# Patient Record
Sex: Female | Born: 1965 | Race: Black or African American | Hispanic: No | Marital: Married | Smoking: Never smoker
Health system: Southern US, Academic
[De-identification: ages and names within clinical notes are randomized; demographics above are authoritative.]

## PROBLEM LIST (undated history)

## (undated) DIAGNOSIS — I509 Heart failure, unspecified: Secondary | ICD-10-CM

## (undated) DIAGNOSIS — F32A Depression, unspecified: Secondary | ICD-10-CM

## (undated) DIAGNOSIS — G5600 Carpal tunnel syndrome, unspecified upper limb: Secondary | ICD-10-CM

## (undated) DIAGNOSIS — R519 Headache, unspecified: Secondary | ICD-10-CM

## (undated) DIAGNOSIS — J45909 Unspecified asthma, uncomplicated: Secondary | ICD-10-CM

## (undated) DIAGNOSIS — I10 Essential (primary) hypertension: Secondary | ICD-10-CM

## (undated) DIAGNOSIS — E079 Disorder of thyroid, unspecified: Secondary | ICD-10-CM

## (undated) DIAGNOSIS — G459 Transient cerebral ischemic attack, unspecified: Secondary | ICD-10-CM

## (undated) DIAGNOSIS — E119 Type 2 diabetes mellitus without complications: Secondary | ICD-10-CM

## (undated) DIAGNOSIS — G629 Polyneuropathy, unspecified: Secondary | ICD-10-CM

## (undated) DIAGNOSIS — M797 Fibromyalgia: Secondary | ICD-10-CM

## (undated) DIAGNOSIS — M129 Arthropathy, unspecified: Secondary | ICD-10-CM

## (undated) DIAGNOSIS — G2581 Restless legs syndrome: Secondary | ICD-10-CM

## (undated) DIAGNOSIS — M5126 Other intervertebral disc displacement, lumbar region: Secondary | ICD-10-CM

## (undated) DIAGNOSIS — K3184 Gastroparesis: Secondary | ICD-10-CM

## (undated) DIAGNOSIS — J449 Chronic obstructive pulmonary disease, unspecified: Secondary | ICD-10-CM

## (undated) DIAGNOSIS — E109 Type 1 diabetes mellitus without complications: Secondary | ICD-10-CM

## (undated) HISTORY — PX: HX HERNIA REPAIR: SHX51

## (undated) HISTORY — PX: HX ROTATOR CUFF REPAIR: SHX139

## (undated) HISTORY — DX: Depression, unspecified: F32.A

## (undated) HISTORY — PX: STOMACH SURGERY: SHX791

## (undated) HISTORY — PX: HX GALL BLADDER SURGERY/CHOLE: SHX55

## (undated) HISTORY — PX: HX HYSTERECTOMY: SHX81

## (undated) HISTORY — PX: SHOULDER OPEN ROTATOR CUFF REPAIR: SHX2407

---

## 1989-10-29 ENCOUNTER — Other Ambulatory Visit (HOSPITAL_COMMUNITY): Payer: Self-pay

## 2006-09-11 ENCOUNTER — Ambulatory Visit (INDEPENDENT_AMBULATORY_CARE_PROVIDER_SITE_OTHER): Payer: Self-pay | Admitting: Neurological Surgery

## 2013-08-19 IMAGING — MG MAMMO DIGITAL DIAGNOSTIC BOTH BREASTS
1 series · 8 of 8 positions shown · non-contrast
Comparison: 

------------- REPORT [id number] -------------
[facility]
[address]
Mor Ms.DECELIS, CIAXAN:
We wish to report the following on your recent mammography examination. We are sending a report to your referring physician or other health care provider. 
(       Normal/Negative:
No evidence of cancer.
This statement is mandated by the [place], Department of Health.
Your examination was performed by one of our technologists, who are registered radiological technologists and also specially certified in mammography:
___
Donjuan, Wavy (M)
Tena, Danielf (M)
Gia, Nelith (M)

Your mammogram was interpreted by our radiologist.
( 
Mmamontsho Seakolo, M.D.
(Annual Breast Examination by a physician or other health care provider
(Annual Mammography Screening beginning at age 40
(Monthly Breast Self Examination
------------- REPORT [id number] -------------
UCHIHA, LAURENE
MAMMO DIGITAL DIAGNOSTIC BOTH BREASTS WITH CAD,US BREAST LT
Exam:  
Diagnostic digital bilateral breast mammogram with CAD and left breast ultrasound
HISTORY: Breast pain.

[R CC · oblique · right · 8 of 9 slices shown]
[im 1/9]
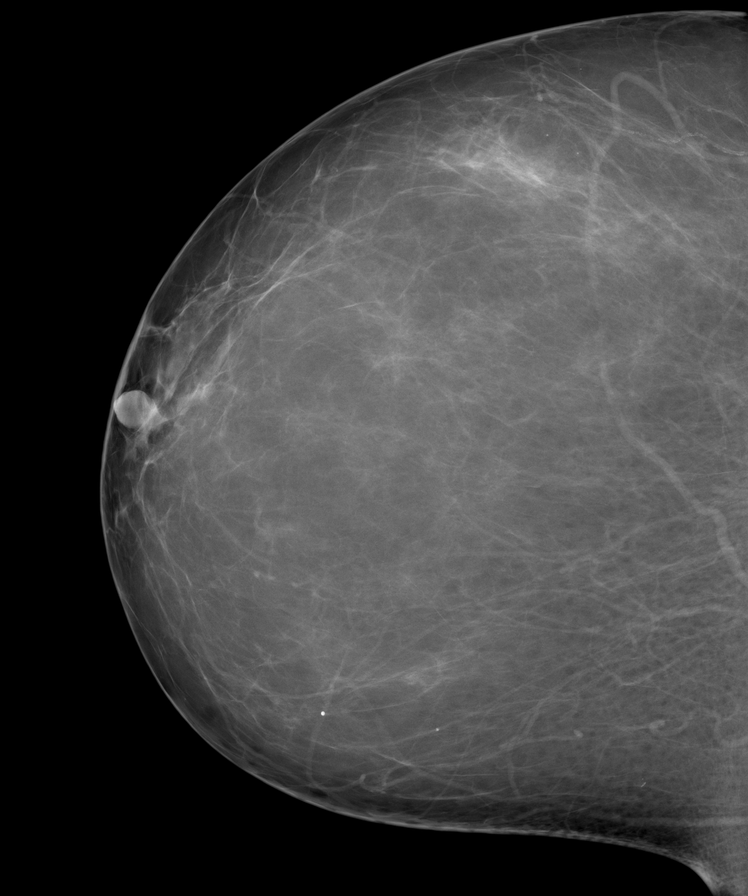
[im 2/9]
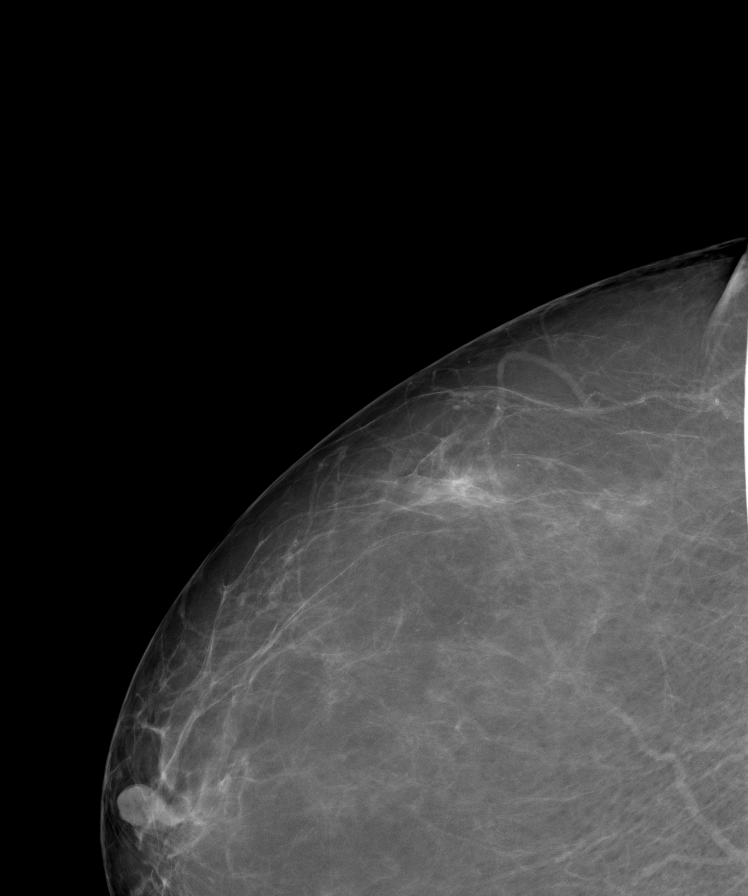
[im 3/9]
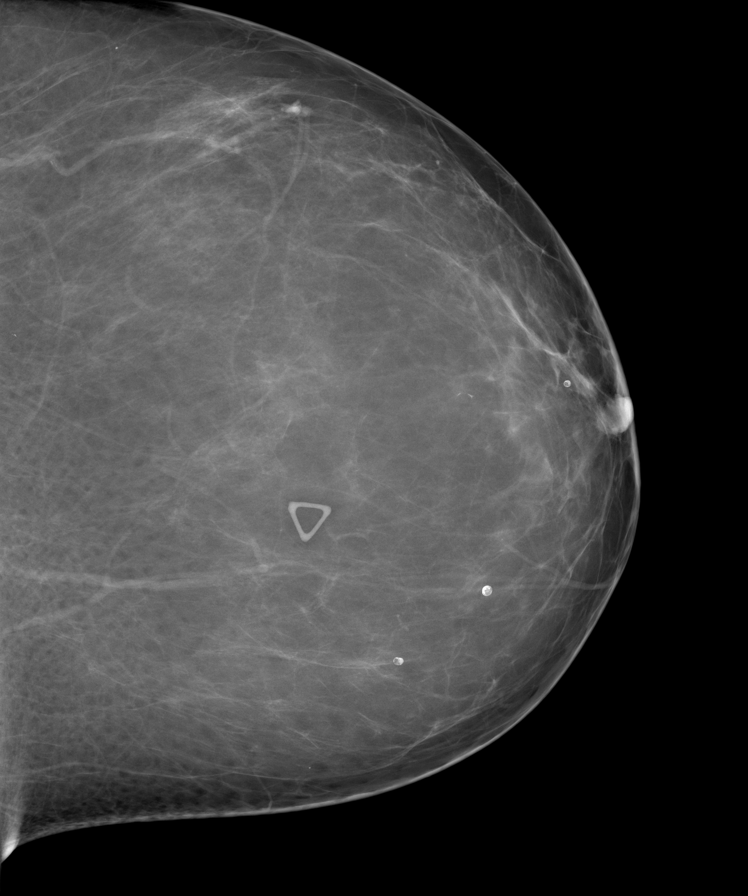
[im 4/9]
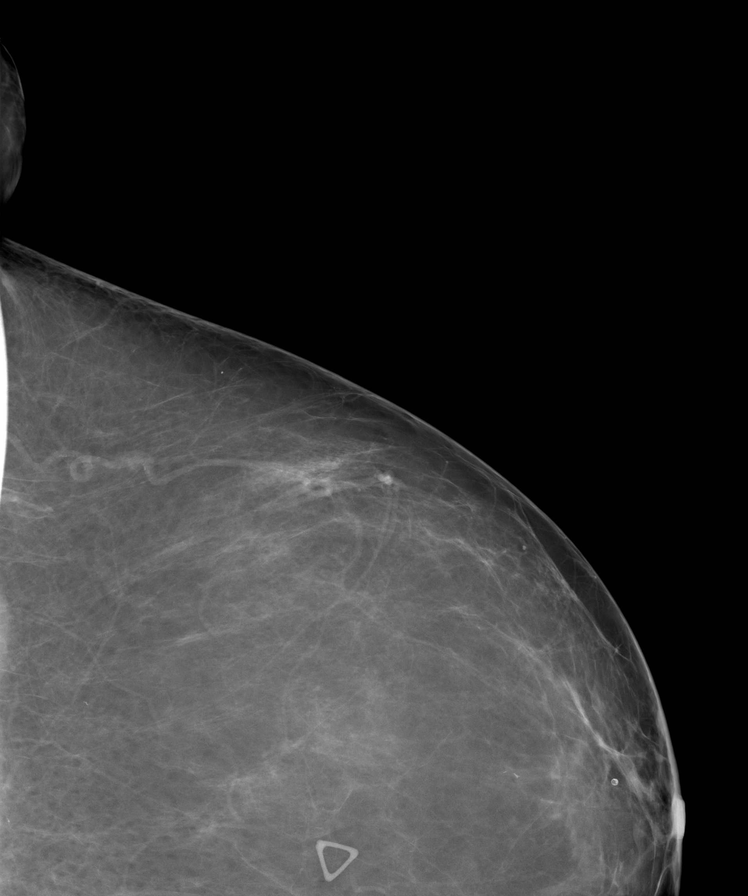
[im 5/9]
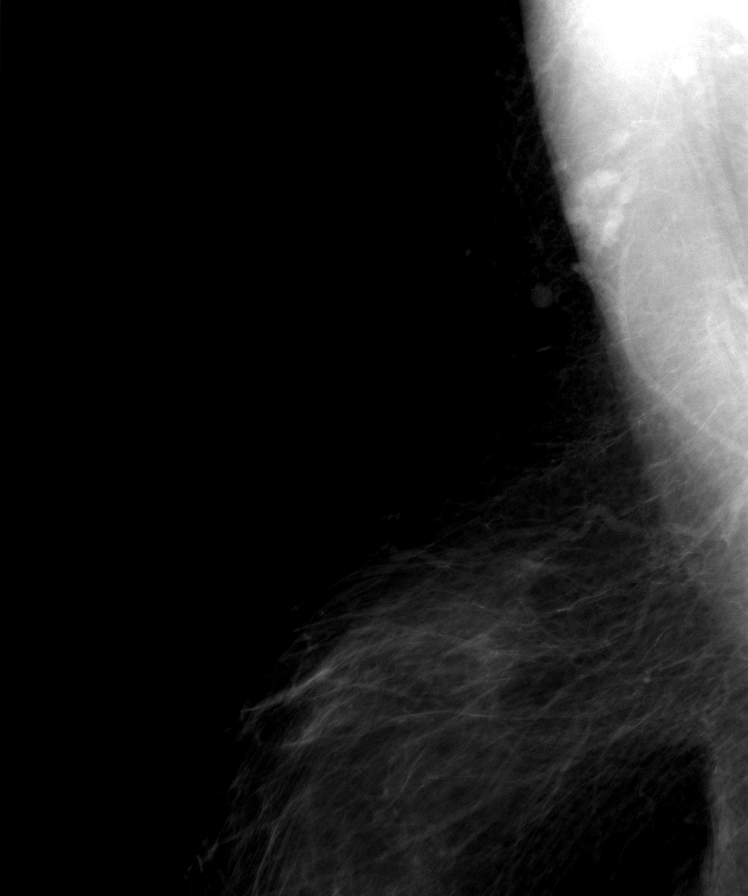
[im 6/9]
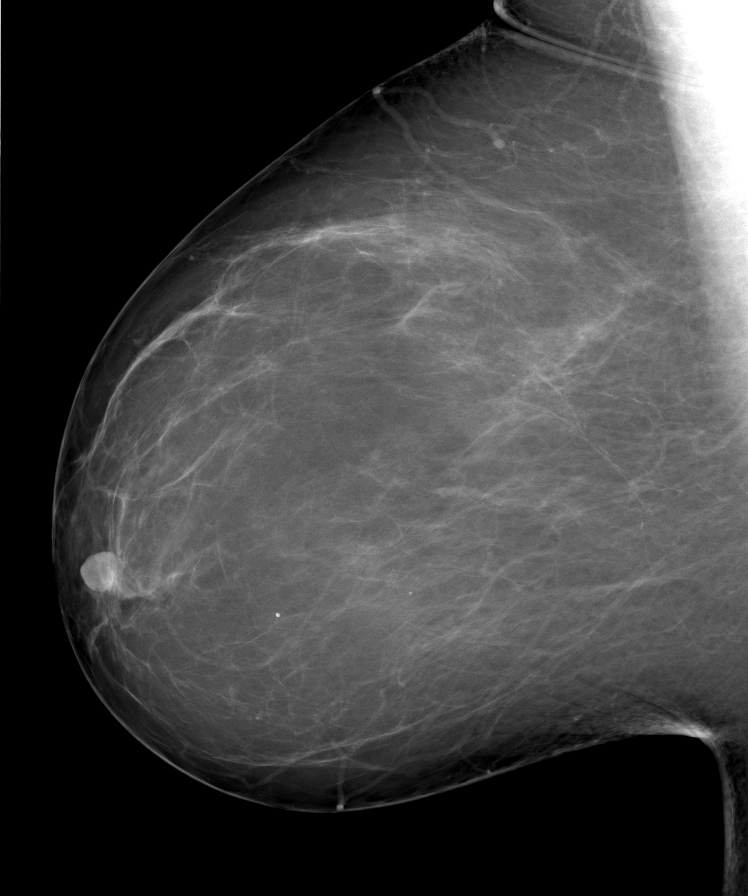
[im 7/9]
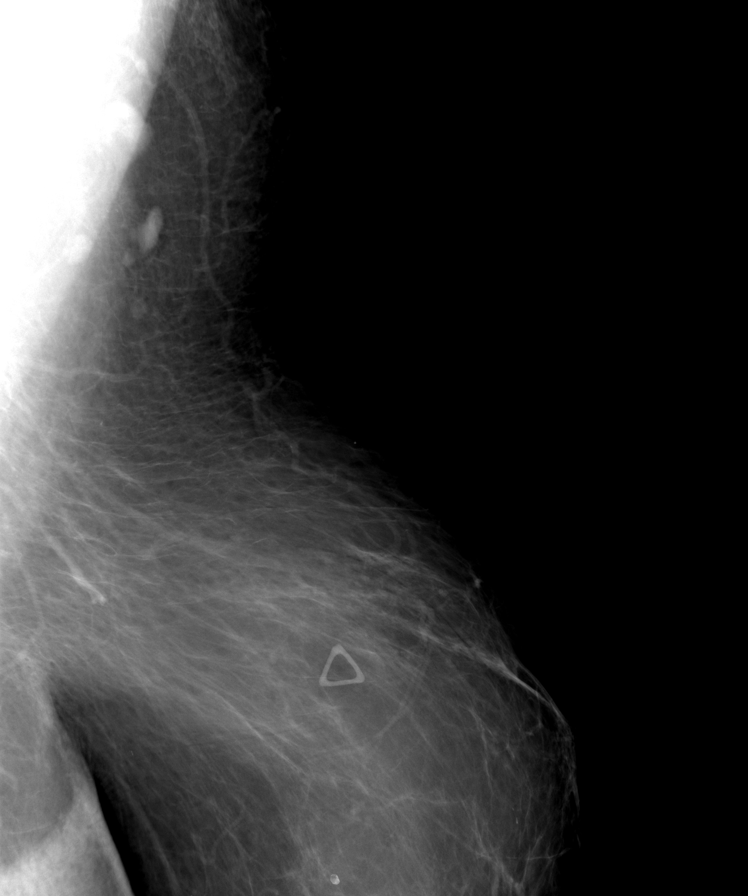
[im 9/9]
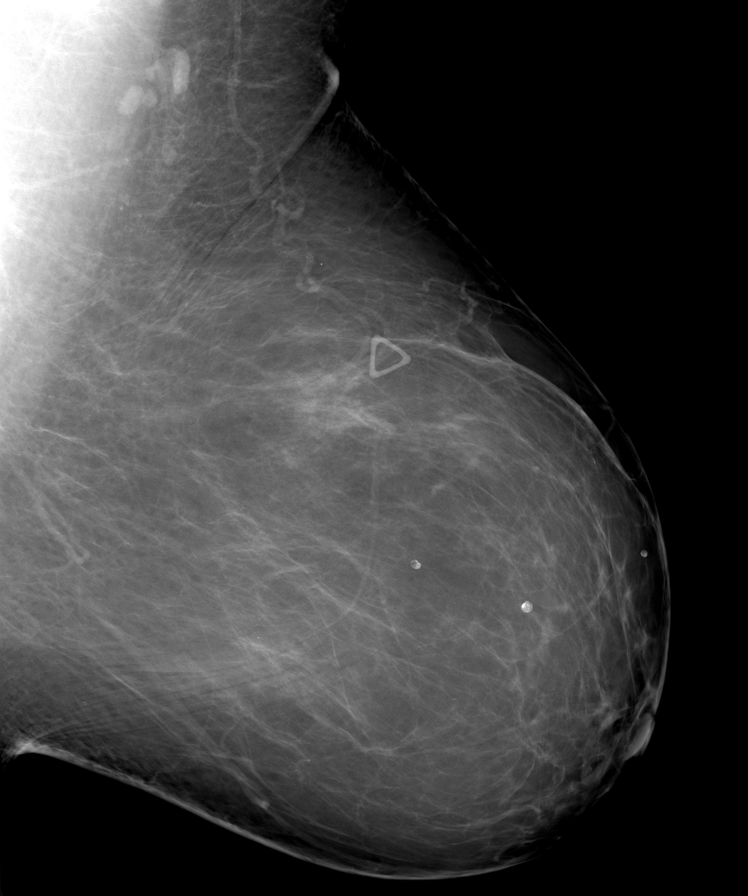

[8 of 8 positions shown; findings below may reference images not displayed]

FINDINGS: Craniocaudal and MLO views of both breasts demonstrates scattered fibroglandular densities. Benign appearing calcifications noted bilaterally. No dominant mass, suspect microcalcifications, or regions of architectural distortion appreciated. 

Dedicated whole breast ultrasound also submitted of all four quadrants with retroareolar and axilla images. No echogenic mass or regions of distortion appreciated.
IMPRESSION: 1.
BIRADS 2. 
RECOMMENDATIONS: Additional workup of reported abnormality should be based on clinical grounds. Findings discussed with the patient who is in agreement. 
Final Assessment Code:
Bi-Rads 2 

BI-RADS 0
Need additional imaging evaluation
BI-RADS 1
Negative mammogram
BI-RADS 2
Benign finding
BI-RADS 3
Probably benign finding: short-interval follow-up suggested
BI-RADS 4
Suspicious abnormality:  biopsy should be considered
BI-RADS 5
Highly suggestive of malignancy; appropriate action should be taken
BI-RADS 6
Known Biopsy-proven Malignancy  Appropriate action should be taken
NOTE:
In compliance with Federal regulations, the results of this mammogram are being sent to the patient.

## 2014-09-16 ENCOUNTER — Ambulatory Visit (INDEPENDENT_AMBULATORY_CARE_PROVIDER_SITE_OTHER): Payer: Self-pay | Admitting: Neurology

## 2014-10-28 ENCOUNTER — Ambulatory Visit (INDEPENDENT_AMBULATORY_CARE_PROVIDER_SITE_OTHER): Payer: Self-pay | Admitting: Neurology

## 2014-10-29 ENCOUNTER — Ambulatory Visit (INDEPENDENT_AMBULATORY_CARE_PROVIDER_SITE_OTHER): Payer: Self-pay | Admitting: Student in an Organized Health Care Education/Training Program

## 2020-06-08 NOTE — Progress Notes (Signed)
STAPLE REMOVAL - Patient denies problem. Incision healing without signs of infection. 18 staples removed without difficulty. Wound edges remain well approximated. Steri Strips applied.

## 2021-05-31 ENCOUNTER — Emergency Department (HOSPITAL_BASED_OUTPATIENT_CLINIC_OR_DEPARTMENT_OTHER): Payer: Medicare Other

## 2021-05-31 ENCOUNTER — Emergency Department (EMERGENCY_DEPARTMENT_HOSPITAL)
Admission: EM | Admit: 2021-05-31 | Discharge: 2021-05-31 | Disposition: A | Discharge status: Home / Self Care | Payer: Medicare Other | Source: Home / Self Care

## 2021-05-31 ENCOUNTER — Other Ambulatory Visit: Payer: Self-pay

## 2021-05-31 ENCOUNTER — Emergency Department
Admission: EM | Admit: 2021-05-31 | Discharge: 2021-05-31 | Discharge status: Against Medical Advice | Payer: Medicare Other | Attending: Family | Admitting: Family

## 2021-05-31 DIAGNOSIS — R1110 Vomiting, unspecified: Secondary | ICD-10-CM

## 2021-05-31 DIAGNOSIS — Z5321 Procedure and treatment not carried out due to patient leaving prior to being seen by health care provider: Secondary | ICD-10-CM | POA: Insufficient documentation

## 2021-05-31 DIAGNOSIS — R1030 Lower abdominal pain, unspecified: Secondary | ICD-10-CM | POA: Insufficient documentation

## 2021-05-31 DIAGNOSIS — Z20822 Contact with and (suspected) exposure to covid-19: Secondary | ICD-10-CM

## 2021-05-31 DIAGNOSIS — K439 Ventral hernia without obstruction or gangrene: Secondary | ICD-10-CM

## 2021-05-31 DIAGNOSIS — R109 Unspecified abdominal pain: Secondary | ICD-10-CM

## 2021-05-31 DIAGNOSIS — R112 Nausea with vomiting, unspecified: Secondary | ICD-10-CM

## 2021-05-31 DIAGNOSIS — Z9884 Bariatric surgery status: Secondary | ICD-10-CM | POA: Insufficient documentation

## 2021-05-31 LAB — MANUAL DIFFERENTIAL
BAND %: 1 %
BANDS NEUTROPHILS MANUAL: 1
EOSINOPHIL %: 8 %
EOSINOPHIL ABSOLUTE: 0.34 10*3/uL
EOSINOPHILS MANUAL: 8
LYMPHOCYTE %: 41 %
LYMPHOCYTE ABSOLUTE: 1.72 10*3/uL (ref 1.10–5.00)
LYMPHOCYTES MANUAL: 41
MONOCYTE %: 10 %
MONOCYTE ABSOLUTE: 0.42 10*3/uL
MONOCYTES MANUAL: 10
NEUTROPHIL %: 40 %
NEUTROPHIL ABSOLUTE: 1.72 10*3/uL — ABNORMAL LOW (ref 1.80–8.40)
NEUTROPHILS MANUAL: 40
PLATELET MORPHOLOGY COMMENT: NORMAL
TOTAL CELLS COUNTED [#] IN BLOOD: 100
WBC: 4.2 10*3/uL

## 2021-05-31 LAB — COMPREHENSIVE METABOLIC PANEL, NON-FASTING
ALBUMIN/GLOBULIN RATIO: 0.7 — ABNORMAL LOW (ref 0.8–1.4)
ALBUMIN: 3.2 g/dL — ABNORMAL LOW (ref 3.4–5.0)
ALKALINE PHOSPHATASE: 82 U/L (ref 46–116)
ALT (SGPT): 20 U/L (ref <=78)
ANION GAP: 9 mmol/L — ABNORMAL LOW (ref 10–20)
AST (SGOT): 23 U/L (ref 15–37)
BILIRUBIN TOTAL: 0.2 mg/dL (ref 0.2–1.0)
BUN/CREA RATIO: 10
BUN: 10 mg/dL (ref 7–18)
CALCIUM, CORRECTED: 9.7 mg/dL
CALCIUM: 8.9 mg/dL (ref 8.5–10.1)
CHLORIDE: 106 mmol/L (ref 98–107)
CO2 TOTAL: 29 mmol/L (ref 21–32)
CREATININE: 0.97 mg/dL (ref 0.55–1.02)
ESTIMATED GFR: 68 mL/min/{1.73_m2} (ref >59)
GLOBULIN: 4.4
GLUCOSE: 82 mg/dL (ref 74–106)
OSMOLALITY, CALCULATED: 285 mOsm/kg (ref 270–290)
POTASSIUM: 3.8 mmol/L (ref 3.5–5.1)
PROTEIN TOTAL: 7.6 g/dL (ref 6.4–8.2)
SODIUM: 144 mmol/L (ref 136–145)

## 2021-05-31 LAB — CBC WITH DIFF
HCT: 33.4 % — ABNORMAL LOW (ref 37.0–47.0)
HGB: 11.4 g/dL — ABNORMAL LOW (ref 12.5–16.0)
MCH: 27.9 pg (ref 27.0–32.0)
MCHC: 34.1 g/dL (ref 32.0–36.0)
MCV: 81.7 fL (ref 78.0–99.0)
MPV: 7.6 fL (ref 7.4–10.4)
PLATELETS: 307 10*3/uL (ref 140–440)
RBC: 4.09 10*6/uL — ABNORMAL LOW (ref 4.20–5.40)
RDW: 17.9 % — ABNORMAL HIGH (ref 11.6–14.8)
WBC: 4.2 10*3/uL (ref 4.0–10.5)

## 2021-05-31 LAB — URINALYSIS, MACRO/MICRO
BILIRUBIN: NEGATIVE mg/dL
BLOOD: NEGATIVE mg/dL
GLUCOSE: NEGATIVE mg/dL
LEUKOCYTES: NEGATIVE WBCs/uL
NITRITE: NEGATIVE
PH: 6 (ref 4.6–8.0)
PROTEIN: NEGATIVE mg/dL
SPECIFIC GRAVITY: >=1.03 (ref 1.003–1.035)
UROBILINOGEN: 0.2 mg/dL (ref 0.2–1.0)

## 2021-05-31 LAB — LIPASE: LIPASE: 29 U/L — ABNORMAL LOW (ref 73–393)

## 2021-05-31 LAB — COVID-19, FLU A/B, RSV RAPID BY PCR
INFLUENZA VIRUS TYPE A: NOT DETECTED
INFLUENZA VIRUS TYPE B: NOT DETECTED
RESPIRATORY SYNCTIAL VIRUS (RSV): NOT DETECTED
SARS-CoV-2: NOT DETECTED

## 2021-05-31 MED ORDER — MORPHINE 4 MG/ML INJECTION WRAPPER
INJECTION | INTRAMUSCULAR | Status: AC
Start: 2021-05-31 — End: 2021-05-31
  Filled 2021-05-31: qty 1

## 2021-05-31 MED ORDER — PROCHLORPERAZINE MALEATE 10 MG TABLET
10.0000 mg | ORAL_TABLET | Freq: Four times a day (QID) | ORAL | 0 refills | Status: AC | PRN
Start: 2021-05-31 — End: 2021-06-07

## 2021-05-31 MED ORDER — IOHEXOL 350 MG IODINE/ML INTRAVENOUS SOLUTION
100.0000 mL | INTRAVENOUS | Status: AC
Start: 2021-05-31 — End: 2021-05-31
  Administered 2021-05-31: 100 mL via INTRAVENOUS

## 2021-05-31 MED ORDER — SODIUM CHLORIDE 0.9 % (FLUSH) INJECTION SYRINGE
3.0000 mL | INJECTION | Freq: Three times a day (TID) | INTRAMUSCULAR | Status: DC
Start: 2021-05-31 — End: 2021-05-31
  Administered 2021-05-31: 0 mL

## 2021-05-31 MED ORDER — SODIUM CHLORIDE 0.9 % IV BOLUS
1000.0000 mL | INJECTION | Status: AC
Start: 2021-05-31 — End: 2021-05-31
  Administered 2021-05-31: 1000 mL via INTRAVENOUS
  Administered 2021-05-31: 0 mL via INTRAVENOUS

## 2021-05-31 MED ORDER — SODIUM CHLORIDE 0.9 % (FLUSH) INJECTION SYRINGE
3.0000 mL | INJECTION | INTRAMUSCULAR | Status: DC | PRN
Start: 2021-05-31 — End: 2021-05-31

## 2021-05-31 MED ORDER — ONDANSETRON HCL (PF) 4 MG/2 ML INJECTION SOLUTION
4.0000 mg | INTRAMUSCULAR | Status: AC
Start: 2021-05-31 — End: 2021-05-31
  Administered 2021-05-31: 4 mg via INTRAVENOUS

## 2021-05-31 MED ORDER — ONDANSETRON HCL (PF) 4 MG/2 ML INJECTION SOLUTION
INTRAMUSCULAR | Status: AC
Start: 2021-05-31 — End: 2021-05-31
  Filled 2021-05-31: qty 2

## 2021-05-31 MED ORDER — ONDANSETRON 4 MG DISINTEGRATING TABLET
4.0000 mg | ORAL_TABLET | Freq: Three times a day (TID) | ORAL | 0 refills | Status: DC | PRN
Start: 2021-05-31 — End: 2023-01-28

## 2021-05-31 MED ORDER — MORPHINE 4 MG/ML INJECTION WRAPPER
4.0000 mg | INJECTION | INTRAMUSCULAR | Status: AC
Start: 2021-05-31 — End: 2021-05-31
  Administered 2021-05-31: 4 mg via INTRAVENOUS

## 2021-05-31 NOTE — ED Triage Notes (Signed)
Reports emergency gastric bypass [facility] on Feb 16th and has been vomiting ever since. Now vomiting 30-50 times a day, diarrhea, unable to tolerate PO fluids, weakness, nausea, and sharp lower abdominal pain.

## 2021-05-31 NOTE — ED Provider Notes (Signed)
[facility], [facility] Emergency Department  ED Primary Provider Note  History of Present Illness   Chief Complaint   Patient presents with   . Vomiting     Arrival: The patient arrived by Car    Martha White is a 56 y.o. female who had concerns including Vomiting. nvd, lower abd pain. Gastric bariatric surg feb 16. Has been back for check ups. Vomits daily.     Review of Systems   Constitutional: No fever, chills + weakness   Skin: No rash or diaphoresis  HENT: No headaches, or congestion  Eyes: No vision changes or photophobia   Cardio: No chest pain, palpitations or leg swelling   Respiratory: No cough, wheezing or SOB  GI:  + nausea, vomiting , abd pain ,  stool changes  GU:  No dysuria, hematuria, or increased frequency  MSK: No muscle aches, joint or back pain  Neuro: No seizures, LOC, numbness, tingling, or focal weakness  Psychiatric: No depression, SI or substance abuse  All other systems reviewed and are negative.    Historical Data   History Reviewed This Encounter: all reviewed and noted.     Physical Exam   ED Triage Vitals [05/31/21 1739]   BP (Non-Invasive) (!) 157/109   Heart Rate 88   Respiratory Rate 19   Temperature 36.6 C (97.8 F)   SpO2 99 %   Weight 83.9 kg (185 lb)   Height 1.511 m (4' 11.5")     Constitutional:  56 y.o. female who appears in no distress. Normal color, no cyanosis.   HENT:   Head: Normocephalic and atraumatic.   Mouth/Throat: Oropharynx is clear and dry.  Eyes: EOMI, PERRL   Neck: Trachea midline. Neck supple.  Cardiovascular: RRR, No murmurs, rubs or gallops. Intact distal pulses.  Pulmonary/Chest: BS equal bilaterally. No respiratory distress. No wheezes, rales or chest tenderness.   Abdominal: Bowel sounds present and normal. Abdomen soft, generalized tenderness,  Induration left upper lap site poss seroma. no rebound and no guarding.  Back: No midline spinal tenderness, no paraspinal tenderness, no CVA tenderness.       Musculoskeletal:  No edema, tenderness or deformity.  Skin: warm and dry. No rash, erythema, pallor or cyanosis, no redness no swelling.   Psychiatric: normal mood and affect. Behavior is normal.   Neurological: Patient keenly alert and responsive, easily able to raise eyebrows, facial muscles/expressions symmetric, speaking in fluent sentences, moving all extremities equally and fully, normal gait  Patient Data     Labs Ordered/Reviewed   COMPREHENSIVE METABOLIC PANEL, NON-FASTING - Abnormal; Notable for the following components:       Result Value    ANION GAP 9 (*)     ALBUMIN 3.2 (*)     ALBUMIN/GLOBULIN RATIO 0.7 (*)     All other components within normal limits    Narrative:     Estimated Glomerular Filtration Rate (eGFR) is calculated using the CKD-EPI (2021) equation, intended for patients 27 years of age and older. If gender is not documented or "unknown", there will be no eGFR calculation.   LIPASE - Abnormal; Notable for the following components:    LIPASE 29 (*)     All other components within normal limits   CBC WITH DIFF - Abnormal; Notable for the following components:    RBC 4.09 (*)     HGB 11.4 (*)     HCT 33.4 (*)     RDW 17.9 (*)  All other components within normal limits   URINALYSIS, MACRO/MICRO - Abnormal; Notable for the following components:    KETONES Trace (*)     All other components within normal limits   MANUAL DIFFERENTIAL - Abnormal; Notable for the following components:    NEUTROPHIL ABSOLUTE 1.72 (*)     All other components within normal limits   COVID-19, FLU A/B, RSV RAPID BY PCR - Normal    Narrative:     Results are for the simultaneous qualitative identification of SARS-CoV-2 (formerly 2019-nCoV), Influenza A, Influenza B, and RSV RNA. These etiologic agents are generally detectable in nasopharyngeal and nasal swabs during the ACUTE PHASE of infection. Hence, this test is intended to be performed on respiratory specimens collected from individuals with signs and symptoms of upper respiratory  tract infection who meet Centers for Disease Control and Prevention (CDC) clinical and/or epidemiological criteria for Coronavirus Disease 2019 (COVID-19) testing. CDC COVID-19 criteria for testing on human specimens is available at [facility] webpage information for Healthcare Professionals: Coronavirus Disease 2019 (COVID-19) ([email or web address]uk).     False-negative results may occur if the virus has genomic mutations, insertions, deletions, or rearrangements or if performed very early in the course of illness. Otherwise, negative results indicate virus specific RNA targets are not detected, however negative results do not preclude SARS-CoV-2 infection/COVID-19, Influenza, or Respiratory syncytial virus infection. Results should not be used as the sole basis for patient management decisions. Negative results must be combined with clinical observations, patient history, and epidemiological information. If upper respiratory tract infection is still suspected based on exposure history together with other clinical findings, re-testing should be considered.    Disclaimer:   This assay has been authorized by FDA under an Emergency Use Authorization for use in laboratories certified under the Clinical Laboratory Improvement Amendments of 1988 (CLIA), 42 U.S.C. 929-112-5031, to perform high complexity tests. The impacts of vaccines, antiviral therapeutics, antibiotics, chemotherapeutic or immunosuppressant drugs have not been evaluated.     Test methodology:   Cepheid Xpert Xpress SARS-CoV-2/Flu/RSV Assay real-time polymerase chain reaction (RT-PCR) test on the GeneXpert Dx and Xpert Xpress systems.   CBC/DIFF    Narrative:     The following orders were created for panel order CBC/DIFF.  Procedure                               Abnormality         Status                     ---------                               -----------         ------                     CBC WITH JGGE[[id number]]                 Abnormal            Final result               MANUAL DIFFERENTIAL[[id number]]          Abnormal            Final result                 Please view results for these tests on  the individual orders.   URINALYSIS WITH REFLEX MICROSCOPIC AND CULTURE IF POSITIVE    Narrative:     The following orders were created for panel order URINALYSIS WITH REFLEX MICROSCOPIC AND CULTURE IF POSITIVE.  Procedure                               Abnormality         Status                     ---------                               -----------         ------                     URINALYSIS, MACRO/MICRO[[id number]]      Abnormal            Final result                 Please view results for these tests on the individual orders.     CT ABDOMEN PELVIS W IV CONTRAST   Final Result by Chinita Greenland, RT (R)(M) (03/15 2036)        Medical Decision Making        MDM     Sbo, GERD. Gastritis. Surgical complications.     Medications Administered in the ED   NS flush syringe (has no administration in time range)   NS flush syringe (has no administration in time range)   NS bolus infusion 1,000 mL (1,000 mL Intravenous New Bag/New Syringe 05/31/21 1900)   ondansetron (ZOFRAN) 2 mg/mL injection (4 mg Intravenous Given 05/31/21 1914)   morphine 4 mg/mL injection (4 mg Intravenous Given 05/31/21 1913)   iohexol (OMNIPAQUE 350) infusion (100 mL Intravenous Given 05/31/21 1949)     Clinical Impression   Vomiting, unspecified vomiting type, unspecified whether nausea present (Primary)   Abdominal wall hernia   Abdominal pain, unspecified abdominal location       Disposition: Discharged

## 2021-05-31 NOTE — ED Nurses Note (Signed)
I and several other nurses can not get iv, night charge nurse to try

## 2021-07-01 ENCOUNTER — Emergency Department (HOSPITAL_BASED_OUTPATIENT_CLINIC_OR_DEPARTMENT_OTHER): Payer: Medicare Other

## 2021-07-01 ENCOUNTER — Other Ambulatory Visit: Payer: Self-pay

## 2021-07-01 ENCOUNTER — Encounter (HOSPITAL_BASED_OUTPATIENT_CLINIC_OR_DEPARTMENT_OTHER): Payer: Self-pay | Admitting: FAMILY PRACTICE

## 2021-07-01 ENCOUNTER — Emergency Department
Admission: EM | Admit: 2021-07-01 | Discharge: 2021-07-01 | Disposition: A | Discharge status: Home / Self Care | Payer: Medicare Other | Attending: FAMILY PRACTICE | Admitting: FAMILY PRACTICE

## 2021-07-01 DIAGNOSIS — K3184 Gastroparesis: Secondary | ICD-10-CM

## 2021-07-01 DIAGNOSIS — K449 Diaphragmatic hernia without obstruction or gangrene: Secondary | ICD-10-CM | POA: Insufficient documentation

## 2021-07-01 DIAGNOSIS — K222 Esophageal obstruction: Secondary | ICD-10-CM | POA: Insufficient documentation

## 2021-07-01 DIAGNOSIS — R112 Nausea with vomiting, unspecified: Secondary | ICD-10-CM

## 2021-07-01 DIAGNOSIS — I10 Essential (primary) hypertension: Secondary | ICD-10-CM

## 2021-07-01 DIAGNOSIS — E785 Hyperlipidemia, unspecified: Secondary | ICD-10-CM | POA: Insufficient documentation

## 2021-07-01 DIAGNOSIS — Z20822 Contact with and (suspected) exposure to covid-19: Secondary | ICD-10-CM | POA: Insufficient documentation

## 2021-07-01 DIAGNOSIS — E1143 Type 2 diabetes mellitus with diabetic autonomic (poly)neuropathy: Secondary | ICD-10-CM | POA: Insufficient documentation

## 2021-07-01 HISTORY — DX: Other intervertebral disc displacement, lumbar region: M51.26

## 2021-07-01 HISTORY — DX: Gastroparesis: K31.84

## 2021-07-01 HISTORY — DX: Headache, unspecified: R51.9

## 2021-07-01 HISTORY — DX: Essential (primary) hypertension: I10

## 2021-07-01 HISTORY — DX: Heart failure, unspecified: I50.9

## 2021-07-01 HISTORY — DX: Carpal tunnel syndrome, unspecified upper limb: G56.00

## 2021-07-01 HISTORY — DX: Unspecified asthma, uncomplicated: J45.909

## 2021-07-01 HISTORY — DX: Transient cerebral ischemic attack, unspecified: G45.9

## 2021-07-01 HISTORY — DX: Fibromyalgia: M79.7

## 2021-07-01 HISTORY — DX: Chronic obstructive pulmonary disease, unspecified: J44.9

## 2021-07-01 HISTORY — DX: Polyneuropathy, unspecified: G62.9

## 2021-07-01 HISTORY — DX: Restless legs syndrome: G25.81

## 2021-07-01 HISTORY — DX: Disorder of thyroid, unspecified: E07.9

## 2021-07-01 HISTORY — DX: Arthropathy, unspecified: M12.9

## 2021-07-01 HISTORY — DX: Type 1 diabetes mellitus without complications (CMS HCC): E10.9

## 2021-07-01 LAB — CBC WITH DIFF
BASOPHIL #: 0 10*3/uL (ref 0.00–0.30)
BASOPHIL %: 1 % (ref 0–3)
EOSINOPHIL #: 0.1 10*3/uL (ref 0.00–0.80)
EOSINOPHIL %: 4 % (ref 0–7)
HCT: 37.3 % (ref 37.0–47.0)
HGB: 12.2 g/dL — ABNORMAL LOW (ref 12.5–16.0)
LYMPHOCYTE #: 1.1 10*3/uL (ref 1.10–5.00)
LYMPHOCYTE %: 32 % (ref 25–45)
MCH: 26.6 pg — ABNORMAL LOW (ref 27.0–32.0)
MCHC: 32.8 g/dL (ref 32.0–36.0)
MCV: 81.2 fL (ref 78.0–99.0)
MONOCYTE #: 0.4 10*3/uL (ref 0.00–1.30)
MONOCYTE %: 12 % (ref 0–12)
MPV: 7.8 fL (ref 7.4–10.4)
NEUTROPHIL #: 1.8 10*3/uL (ref 1.80–8.40)
NEUTROPHIL %: 51 % (ref 40–76)
PLATELETS: 249 10*3/uL (ref 140–440)
RBC: 4.6 10*6/uL (ref 4.20–5.40)
RDW: 16.1 % — ABNORMAL HIGH (ref 11.6–14.8)
WBC: 3.5 10*3/uL — ABNORMAL LOW (ref 4.0–10.5)
WBCS UNCORRECTED: 3.5 10*3/uL

## 2021-07-01 LAB — COMPREHENSIVE METABOLIC PANEL, NON-FASTING
ALBUMIN/GLOBULIN RATIO: 0.8 (ref 0.8–1.4)
ALBUMIN: 3.3 g/dL — ABNORMAL LOW (ref 3.4–5.0)
ALKALINE PHOSPHATASE: 93 U/L (ref 46–116)
ALT (SGPT): 68 U/L (ref <=78)
ANION GAP: 9 mmol/L — ABNORMAL LOW (ref 10–20)
AST (SGOT): 54 U/L — ABNORMAL HIGH (ref 15–37)
BILIRUBIN TOTAL: 0.5 mg/dL (ref 0.2–1.0)
BUN/CREA RATIO: 11
BUN: 11 mg/dL (ref 7–18)
CALCIUM, CORRECTED: 9.7 mg/dL
CALCIUM: 9 mg/dL (ref 8.5–10.1)
CHLORIDE: 103 mmol/L (ref 98–107)
CO2 TOTAL: 29 mmol/L (ref 21–32)
CREATININE: 1.02 mg/dL (ref 0.55–1.02)
ESTIMATED GFR: 64 mL/min/{1.73_m2} (ref >59)
GLOBULIN: 4.1
GLUCOSE: 98 mg/dL (ref 74–106)
OSMOLALITY, CALCULATED: 281 mOsm/kg (ref 270–290)
POTASSIUM: 3.9 mmol/L (ref 3.5–5.1)
PROTEIN TOTAL: 7.4 g/dL (ref 6.4–8.2)
SODIUM: 141 mmol/L (ref 136–145)

## 2021-07-01 LAB — COVID-19, FLU A/B, RSV RAPID BY PCR
INFLUENZA VIRUS TYPE A: NOT DETECTED
INFLUENZA VIRUS TYPE B: NOT DETECTED
RESPIRATORY SYNCTIAL VIRUS (RSV): NOT DETECTED
SARS-CoV-2: NOT DETECTED

## 2021-07-01 MED ORDER — LACTATED RINGERS IV BOLUS
1000.0000 mL | INJECTION | Status: AC
Start: 2021-07-01 — End: 2021-07-01
  Administered 2021-07-01: 1000 mL via INTRAVENOUS
  Administered 2021-07-01: 0 mL via INTRAVENOUS

## 2021-07-01 MED ORDER — ACETAMINOPHEN 325 MG TABLET
650.0000 mg | ORAL_TABLET | ORAL | Status: AC
Start: 2021-07-01 — End: 2021-07-01
  Administered 2021-07-01: 650 mg via ORAL

## 2021-07-01 MED ORDER — PROMETHAZINE 25 MG TABLET
25.0000 mg | ORAL_TABLET | Freq: Four times a day (QID) | ORAL | 0 refills | Status: AC | PRN
Start: 2021-07-01 — End: 2021-07-08

## 2021-07-01 MED ORDER — LIDOCAINE HCL 10 MG/ML (1 %) INJECTION SOLUTION
INTRAMUSCULAR | Status: AC
Start: 2021-07-01 — End: 2021-07-01
  Filled 2021-07-01: qty 20

## 2021-07-01 MED ORDER — ACETAMINOPHEN 325 MG TABLET
ORAL_TABLET | ORAL | Status: AC
Start: 2021-07-01 — End: 2021-07-01
  Filled 2021-07-01: qty 2

## 2021-07-01 NOTE — ED Nurses Note (Signed)
Stated just don't feel good nasal congestion and cough  lungs clear with equal unlabored respiration  O2 sat 99%  very difficult IV stick   stated that we will have to do  Jugular stick

## 2021-07-01 NOTE — ED Nurses Note (Signed)
Dr at bedside  Covid,flu and RSV all negative  labwork ok  pt stated she felt better with the liter of fluids

## 2021-07-01 NOTE — ED Provider Notes (Signed)
[facility], [facility] Emergency Department  ED Primary Provider Note  History of Present Illness   Chief Complaint   Patient presents with   . Flu Like Symptoms     Martha White is a 56 y.o. female who had concerns including Flu Like Symptoms.  Arrival: The patient arrived by Car    This 33 yof presents with 2-3 day history of flu like symptoms. She has nausea, loose stool, poor appetite, with her history of gastroparesis, hiatal hernia, esophageal stricture and DM she has many associated symptoms with her DM disease complications as well as the esophageal stricture.  She is unable to drink or eat much due to nausea and resultant vomiting, thinks she is dehydrated.  No fever, but some chills.  No chest pain, pressure, heaviness, dyspnea, wheezes, but congestion PND, throat irritation, myalgia and arthralgias.          Review of Systems   Pertinent positive and negative ROS as per HPI.  Historical Data   History Reviewed This Encounter: Medical History  Surgical History  Family History  Social History      Physical Exam   ED Triage Vitals [07/01/21 0849]   BP (Non-Invasive) 135/81   Heart Rate 91   Respiratory Rate 20   Temperature 36.1 C (97 F)   SpO2 100 %   Weight 80.7 kg (178 lb)   Height 1.499 m (_0 )     Physical Exam   General:  appears nontoxic, and in no acute distress but she does appear to not feel well.    Ears: TMs are clear without fluid or retraction  Nasal:  nasal passages mildly injected, turbinate erythema and swelling noted, passages nearly occluded bilaterally, white discharge noted.    Oral:  Pink and moist   Pharynx:  Mild injection without PND, exudate, petechiae noted currently.    Neck:  Supple without lymphadenopathy or sensory muscle use to breathe   Lungs:  Clear symmetrical with good aeration  Heart:  Regular rate rhythm normal S1-S2 without murmur gallop   Abdomen:  Obese, soft, mild generalized tenderness without guarding, rebound or referred  pain.  Bowel sounds are normal.    Extremities:  Moving symmetrically  Neurological: No focal deficits   Skin:  No suspicious rashes, lesions, or edema.    Patient Data     Labs Ordered/Reviewed   COMPREHENSIVE METABOLIC PANEL, NON-FASTING - Abnormal; Notable for the following components:       Result Value    ANION GAP 9 (*)     ALBUMIN 3.3 (*)     AST (SGOT) 54 (*)     All other components within normal limits    Narrative:     Estimated Glomerular Filtration Rate (eGFR) is calculated using the CKD-EPI (2021) equation, intended for patients 108 years of age and older. If gender is not documented or "unknown", there will be no eGFR calculation.   CBC WITH DIFF - Abnormal; Notable for the following components:    WBC 3.5 (*)     HGB 12.2 (*)     MCH 26.6 (*)     RDW 16.1 (*)     All other components within normal limits   COVID-19, FLU A/B, RSV RAPID BY PCR - Normal    Narrative:     Results are for the simultaneous qualitative identification of SARS-CoV-2 (formerly 2019-nCoV), Influenza A, Influenza B, and RSV RNA. These etiologic agents are generally detectable in nasopharyngeal and nasal swabs  during the ACUTE PHASE of infection. Hence, this test is intended to be performed on respiratory specimens collected from individuals with signs and symptoms of upper respiratory tract infection who meet Centers for Disease Control and Prevention (CDC) clinical and/or epidemiological criteria for Coronavirus Disease 2019 (COVID-19) testing. CDC COVID-19 criteria for testing on human specimens is available at [facility] webpage information for Healthcare Professionals: Coronavirus Disease 2019 (COVID-19) ([employer]).     False-negative results may occur if the virus has genomic mutations, insertions, deletions, or rearrangements or if performed very early in the course of illness. Otherwise, negative results indicate virus specific RNA targets are not detected, however negative  results do not preclude SARS-CoV-2 infection/COVID-19, Influenza, or Respiratory syncytial virus infection. Results should not be used as the sole basis for patient management decisions. Negative results must be combined with clinical observations, patient history, and epidemiological information. If upper respiratory tract infection is still suspected based on exposure history together with other clinical findings, re-testing should be considered.    Disclaimer:   This assay has been authorized by FDA under an Emergency Use Authorization for use in laboratories certified under the Clinical Laboratory Improvement Amendments of 1988 (CLIA), 42 U.S.C. [id number], to perform high complexity tests. The impacts of vaccines, antiviral therapeutics, antibiotics, chemotherapeutic or immunosuppressant drugs have not been evaluated.     Test methodology:   Cepheid Xpert Xpress SARS-CoV-2/Flu/RSV Assay real-time polymerase chain reaction (RT-PCR) test on the GeneXpert Dx and Xpert Xpress systems.   CBC/DIFF    Narrative:     The following orders were created for panel order CBC/DIFF.  Procedure                               Abnormality         Status                     ---------                               -----------         ------                     CBC WITH OMVE[[id number]]                Abnormal            Final result                 Please view results for these tests on the individual orders.     No orders to display     Medical Decision Making        Medical Decision Making  High complexity due to presentation, comorbidities    Amount and/or Complexity of Data Reviewed  Labs: ordered.     Details: CBC, CMP, and 4 Plex all were ordered, and reviewed.      Risk  OTC drugs.               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

## 2021-07-01 NOTE — ED Nurses Note (Signed)
Iv infusing,   awaiting lab results

## 2021-07-01 NOTE — Discharge Instructions (Addendum)
Your gastroparesis is a likely cause of the nausea with vomiting, and the stricture is a cause of the difficulty swallowing.  Follow up with your primary care provider for reevaluation.  Call Dr. Pearlie Oyster at [facility] for the gastroparesis and the esophageal stricture.  Continue home medications, bland diet or full liquid diet as tolerated.  You do not appear dehydrated from the lab reviewed here today.

## 2021-07-01 NOTE — ED Triage Notes (Signed)
Cough, green and yellow phlegm, body aches and been ongoing for 2-3 days.

## 2021-07-24 ENCOUNTER — Encounter (HOSPITAL_COMMUNITY): Payer: Self-pay

## 2021-07-24 ENCOUNTER — Other Ambulatory Visit: Payer: Self-pay

## 2021-07-24 ENCOUNTER — Emergency Department
Admission: EM | Admit: 2021-07-24 | Discharge: 2021-07-24 | Disposition: A | Discharge status: Home / Self Care | Payer: Medicare Other | Attending: Family | Admitting: Family

## 2021-07-24 ENCOUNTER — Emergency Department (HOSPITAL_COMMUNITY): Payer: Medicare Other

## 2021-07-24 DIAGNOSIS — Z9884 Bariatric surgery status: Secondary | ICD-10-CM | POA: Insufficient documentation

## 2021-07-24 DIAGNOSIS — Z20822 Contact with and (suspected) exposure to covid-19: Secondary | ICD-10-CM | POA: Insufficient documentation

## 2021-07-24 DIAGNOSIS — R519 Headache, unspecified: Secondary | ICD-10-CM

## 2021-07-24 DIAGNOSIS — R112 Nausea with vomiting, unspecified: Secondary | ICD-10-CM | POA: Insufficient documentation

## 2021-07-24 DIAGNOSIS — R110 Nausea: Secondary | ICD-10-CM

## 2021-07-24 LAB — COVID-19, FLU A/B, RSV RAPID BY PCR
INFLUENZA VIRUS TYPE A: NOT DETECTED
INFLUENZA VIRUS TYPE B: NOT DETECTED
RESPIRATORY SYNCTIAL VIRUS (RSV): NOT DETECTED
SARS-CoV-2: NOT DETECTED

## 2021-07-24 MED ORDER — PROMETHAZINE 25 MG RECTAL SUPPOSITORY
12.5000 mg | RECTAL | Status: AC
Start: 2021-07-24 — End: 2021-07-24
  Administered 2021-07-24: 12.5 mg via RECTAL

## 2021-07-24 MED ORDER — SODIUM CHLORIDE 0.9 % IV BOLUS
1000.0000 mL | INJECTION | Status: DC
Start: 2021-07-24 — End: 2021-07-24

## 2021-07-24 MED ORDER — KETOROLAC 30 MG/ML (1 ML) INJECTION SOLUTION
INTRAMUSCULAR | Status: AC
Start: 2021-07-24 — End: 2021-07-24
  Filled 2021-07-24: qty 1

## 2021-07-24 MED ORDER — DIPHENHYDRAMINE 50 MG/ML INJECTION SOLUTION
12.5000 mg | INTRAMUSCULAR | Status: DC
Start: 2021-07-24 — End: 2021-07-24

## 2021-07-24 MED ORDER — PROCHLORPERAZINE EDISYLATE 10 MG/2 ML (5 MG/ML) INJECTION SOLUTION
INTRAMUSCULAR | Status: AC
Start: 2021-07-24 — End: 2021-07-24
  Filled 2021-07-24: qty 2

## 2021-07-24 MED ORDER — PROCHLORPERAZINE EDISYLATE 10 MG/2 ML (5 MG/ML) INJECTION SOLUTION
10.0000 mg | INTRAMUSCULAR | Status: AC
Start: 2021-07-24 — End: 2021-07-24
  Administered 2021-07-24: 10 mg via INTRAMUSCULAR

## 2021-07-24 MED ORDER — DIPHENHYDRAMINE 50 MG/ML INJECTION SOLUTION
25.0000 mg | INTRAMUSCULAR | Status: AC
Start: 2021-07-24 — End: 2021-07-24
  Administered 2021-07-24: 25 mg via INTRAMUSCULAR

## 2021-07-24 MED ORDER — KETOROLAC 30 MG/ML (1 ML) INJECTION SOLUTION
30.0000 mg | INTRAMUSCULAR | Status: AC
Start: 2021-07-24 — End: 2021-07-24
  Administered 2021-07-24: 30 mg via INTRAMUSCULAR

## 2021-07-24 MED ORDER — PROMETHAZINE 25 MG RECTAL SUPPOSITORY
25.0000 mg | Freq: Four times a day (QID) | RECTAL | 0 refills | Status: AC | PRN
Start: 2021-07-24 — End: 2021-07-31

## 2021-07-24 MED ORDER — NAPROXEN 250 MG TABLET
500.0000 mg | ORAL_TABLET | ORAL | Status: DC
Start: 2021-07-24 — End: 2021-07-24

## 2021-07-24 MED ORDER — DIPHENHYDRAMINE 50 MG/ML INJECTION SOLUTION
INTRAMUSCULAR | Status: AC
Start: 2021-07-24 — End: 2021-07-24
  Filled 2021-07-24: qty 1

## 2021-07-24 MED ORDER — PROMETHAZINE 25 MG RECTAL SUPPOSITORY
RECTAL | Status: AC
Start: 2021-07-24 — End: 2021-07-24
  Filled 2021-07-24: qty 1

## 2021-07-24 MED ORDER — PROCHLORPERAZINE EDISYLATE 10 MG/2 ML (5 MG/ML) INJECTION SOLUTION
10.0000 mg | INTRAMUSCULAR | Status: DC
Start: 2021-07-24 — End: 2021-07-24

## 2021-07-24 NOTE — ED Nurses Note (Signed)
PT ASSIGNED TO ER MINOR ROOM 28. PT HERE TODAY WITH C/O OF HEADACHE X THREE DAYS WITH NAUSEA AND VOMITING. PT STATES THAT THE PAIN BEGINS AT THE "TOP OF MY HEAD AND RADIATES DOWN TO THE SIDES OF MY HEAD". AWAITING PROVIDER'S ASSESSMENT AND ORDERS. WILL CONTINUE TO MONITOR.

## 2021-07-24 NOTE — ED Nurses Note (Signed)
Patient discharged home with family.  AVS reviewed with patient/care giver.  A written copy of the AVS and discharge instructions was given to the patient/care giver.  Questions sufficiently answered as needed.  Patient/care giver encouraged to follow up with PCP as indicated.  In the event of an emergency, patient/care giver instructed to call 911 or go to the nearest emergency room.

## 2021-07-24 NOTE — ED Provider Notes (Signed)
[facility]  ED Primary Provider Note  History of Present Illness   Chief Complaint   Patient presents with   . Headache   . Vomiting     Martha White is a 56 y.o. female who had concerns including Headache and Vomiting.  Arrival: The patient arrived by Car    Patient 56 year old female presents emergency room complaining of pulsating headache in her temples bilaterally x3 days.  Patient states that she thinks she is "dehydrated" after gastric bypass surgery.  Patient states she is had difficulty keeping anything down to she is been unable to manage had headache at home.  Patient denies history of migraines or headaches.  Patient states that she usually gets Dilaudid or Toradol IM and Phenergan for headache.  Patient denies blurred vision, change in consciousness or loss of consciousness, fevers, blurred vision or injury to head.  Patient states that headache was gradual onset that has worsened.  Patient denies photophobia nausea or vomiting.        Review of Systems   Pertinent positive and negative ROS as per HPI.  Historical Data   History Reviewed This Encounter:      Physical Exam   ED Triage Vitals [07/24/21 1653]   BP (Non-Invasive) (!) 155/97   Heart Rate 92   Respiratory Rate 18   Temperature 36.6 C (97.9 F)   SpO2 100 %   Weight 79.4 kg (175 lb)   Height 1.499 m (4\' 11" )     Physical Exam  Vitals and nursing note reviewed.   Constitutional:       General: She is not in acute distress.     Appearance: She is well-developed.   HENT:      Head: Normocephalic and atraumatic.   Eyes:      Conjunctiva/sclera: Conjunctivae normal.   Cardiovascular:      Rate and Rhythm: Normal rate and regular rhythm.      Heart sounds: No murmur heard.  Pulmonary:      Effort: Pulmonary effort is normal. No respiratory distress.      Breath sounds: Normal breath sounds.   Abdominal:      Palpations: Abdomen is soft.      Tenderness: There is no abdominal tenderness.   Musculoskeletal:          General: No swelling.      Cervical back: Neck supple.   Skin:     General: Skin is warm and dry.      Capillary Refill: Capillary refill takes less than 2 seconds.   Neurological:      Mental Status: She is alert.      GCS: GCS eye subscore is 4. GCS verbal subscore is 5. GCS motor subscore is 6.      Cranial Nerves: Cranial nerves 2-12 are intact.      Sensory: Sensation is intact.      Motor: Motor function is intact.      Coordination: Coordination is intact.      Gait: Gait is intact.   Psychiatric:         Mood and Affect: Mood normal.       Patient Data   Labs Ordered/Reviewed - No data to display  CT BRAIN WO IV CONTRAST   Final Result by Edi, Radresults In (05/08 1733)   NO ACUTE FINDINGS         One or more dose reduction techniques were used (e.g., Automated exposure control, adjustment of the mA  and/or kV according to patient size, use of iterative reconstruction technique).         Radiologist location ID: [id number]           Medical Decision Making        Medical Decision Making  Patient 56 year old female presents emergency room complaining of pulsating headache in her temples bilaterally x3 days.  Patient states that she thinks she is "dehydrated" after gastric bypass surgery.  Patient states she is had difficulty keeping anything down to she is been unable to manage had headache at home.  Patient denies history of migraines or headaches.  Patient states that she usually gets Dilaudid or Toradol IM and Phenergan for headache.  Patient denies blurred vision, change in consciousness or loss of consciousness, fevers, blurred vision or injury to head.  Patient states that headache was gradual onset that has worsened.  Patient denies photophobia nausea or vomiting.    CT head ordered and within normal limits no acute abnormalities noted.  Labs ordered but patient states she is unable to get labs drawn and refuses IV.  Discussed other options rehydration with other medications and discussed with patient  that labs are important part of diagnosing the cause of headache.  Patient still denies labs and discussed treatment plan via IM and rectal suppository.  On re-evaluation of patient after IM Toradol, Compazine, and Benadryl and rectal Phenergan patient no longer complaining of headache.  Patient taking pain medication at home as needed so patient prescribed rectal suppositories for when patient is unable to keep p.o. fluids down.  Patient discharged and instructed to follow up with primary care provider.    Risk  Prescription drug management.               Medications Administered in the ED   prochlorperazine (COMPAZINE) 5 mg/mL injection (10 mg IntraMUSCULAR Given 07/24/21 1927)   ketorolac (TORADOL) 30 mg/mL injection (30 mg IntraMUSCULAR Given 07/24/21 1927)   promethazine (PHENERGAN) rectal suppository (12.5 mg Rectal Given 07/24/21 1928)   diphenhydrAMINE (BENADRYL) 50 mg/mL injection (25 mg IntraMUSCULAR Given 07/24/21 1927)     Clinical Impression   Headache (Primary)   Nausea       Disposition: Discharged

## 2021-07-24 NOTE — ED APP Handoff Note (Signed)
[facility]  Emergency Department  Provider in Triage Note    Name: Martha White  Age: 56 y.o.  Gender: female     Subjective:   Martha White is a 56 y.o. female who presents with complaint of headache x 3 days, with N/V. Reports history of similar headache caused by "swine flu" Has had Zofran and Ultram around 12noon today    Objective:   Filed Vitals:    07/24/21 1653   BP: (!) 155/97   Pulse: 92   Resp: 18   Temp: 36.6 C (97.9 F)   SpO2: 100%        Assessment:  A medical screening exam was completed.  This patient is a 56 y.o. female with initial findings showing headache and elevated blood pressure    Plan:  Please see initial orders and work-up below.  This is to be continued with full evaluation in the main Emergency Department.     No current facility-administered medications for this encounter.     No results found for this or any previous visit (from the past 24 hour(s)).     Sherlie Ban, FNP-BC  07/24/2021, 16:51

## 2021-07-24 NOTE — ED Triage Notes (Signed)
States she has had a headache for 3 days with n/v. Took ultram at 1200 along with Zofran.

## 2021-10-20 ENCOUNTER — Other Ambulatory Visit (HOSPITAL_COMMUNITY): Payer: Self-pay | Admitting: NURSE PRACTITIONER

## 2021-10-20 DIAGNOSIS — R7401 Elevation of levels of liver transaminase levels: Secondary | ICD-10-CM

## 2021-11-28 ENCOUNTER — Other Ambulatory Visit (HOSPITAL_COMMUNITY): Payer: Self-pay | Admitting: PSYCHIATRY AND NEUROLOGY-NEUROLOGY

## 2021-11-28 DIAGNOSIS — R519 Headache, unspecified: Secondary | ICD-10-CM

## 2021-12-13 ENCOUNTER — Other Ambulatory Visit (HOSPITAL_COMMUNITY): Payer: Medicare (Managed Care)

## 2021-12-22 ENCOUNTER — Other Ambulatory Visit: Payer: Self-pay

## 2021-12-22 ENCOUNTER — Emergency Department
Admission: EM | Admit: 2021-12-22 | Discharge: 2021-12-22 | Disposition: A | Discharge status: Home / Self Care | Payer: Medicare (Managed Care) | Attending: Family | Admitting: Family

## 2021-12-22 ENCOUNTER — Other Ambulatory Visit (HOSPITAL_COMMUNITY): Payer: Medicare (Managed Care)

## 2021-12-22 ENCOUNTER — Emergency Department (EMERGENCY_DEPARTMENT_HOSPITAL): Payer: Medicare (Managed Care)

## 2021-12-22 ENCOUNTER — Emergency Department (HOSPITAL_BASED_OUTPATIENT_CLINIC_OR_DEPARTMENT_OTHER): Payer: Medicare (Managed Care)

## 2021-12-22 ENCOUNTER — Encounter (HOSPITAL_BASED_OUTPATIENT_CLINIC_OR_DEPARTMENT_OTHER): Payer: Self-pay

## 2021-12-22 DIAGNOSIS — S134XXA Sprain of ligaments of cervical spine, initial encounter: Secondary | ICD-10-CM | POA: Insufficient documentation

## 2021-12-22 DIAGNOSIS — S29012A Strain of muscle and tendon of back wall of thorax, initial encounter: Secondary | ICD-10-CM | POA: Insufficient documentation

## 2021-12-22 DIAGNOSIS — S139XXA Sprain of joints and ligaments of unspecified parts of neck, initial encounter: Secondary | ICD-10-CM

## 2021-12-22 DIAGNOSIS — M542 Cervicalgia: Secondary | ICD-10-CM

## 2021-12-22 DIAGNOSIS — S239XXA Sprain of unspecified parts of thorax, initial encounter: Secondary | ICD-10-CM

## 2021-12-22 DIAGNOSIS — S233XXA Sprain of ligaments of thoracic spine, initial encounter: Secondary | ICD-10-CM | POA: Insufficient documentation

## 2021-12-22 DIAGNOSIS — E041 Nontoxic single thyroid nodule: Secondary | ICD-10-CM | POA: Insufficient documentation

## 2021-12-22 DIAGNOSIS — W228XXA Striking against or struck by other objects, initial encounter: Secondary | ICD-10-CM | POA: Insufficient documentation

## 2021-12-22 DIAGNOSIS — M5030 Other cervical disc degeneration, unspecified cervical region: Secondary | ICD-10-CM | POA: Insufficient documentation

## 2021-12-22 DIAGNOSIS — S20223A Contusion of bilateral back wall of thorax, initial encounter: Secondary | ICD-10-CM | POA: Insufficient documentation

## 2021-12-22 DIAGNOSIS — R519 Headache, unspecified: Secondary | ICD-10-CM

## 2021-12-22 MED ORDER — METHOCARBAMOL 750 MG TABLET
ORAL_TABLET | ORAL | Status: AC
Start: 2021-12-22 — End: 2021-12-22
  Filled 2021-12-22: qty 2

## 2021-12-22 MED ORDER — PROCHLORPERAZINE EDISYLATE 10 MG/2 ML (5 MG/ML) INJECTION SOLUTION
10.0000 mg | INTRAMUSCULAR | Status: AC
Start: 2021-12-22 — End: 2021-12-22
  Administered 2021-12-22: 10 mg via INTRAMUSCULAR

## 2021-12-22 MED ORDER — MORPHINE 4 MG/ML INJECTION WRAPPER
INJECTION | INTRAMUSCULAR | Status: AC
Start: 2021-12-22 — End: 2021-12-22
  Filled 2021-12-22: qty 1

## 2021-12-22 MED ORDER — KETOROLAC 10 MG TABLET
10.0000 mg | ORAL_TABLET | Freq: Four times a day (QID) | ORAL | 0 refills | Status: DC | PRN
Start: 2021-12-22 — End: 2023-12-10

## 2021-12-22 MED ORDER — MORPHINE 4 MG/ML INJECTION WRAPPER
4.0000 mg | INJECTION | INTRAMUSCULAR | Status: AC
Start: 2021-12-22 — End: 2021-12-22
  Administered 2021-12-22: 4 mg via INTRAMUSCULAR

## 2021-12-22 MED ORDER — OXYCODONE-ACETAMINOPHEN 5 MG-325 MG TABLET
ORAL_TABLET | ORAL | Status: AC
Start: 2021-12-22 — End: 2021-12-22
  Filled 2021-12-22: qty 2

## 2021-12-22 MED ORDER — PROCHLORPERAZINE EDISYLATE 10 MG/2 ML (5 MG/ML) INJECTION SOLUTION
INTRAMUSCULAR | Status: AC
Start: 2021-12-22 — End: 2021-12-22
  Filled 2021-12-22: qty 2

## 2021-12-22 MED ORDER — METHOCARBAMOL 750 MG TABLET
1500.0000 mg | ORAL_TABLET | Freq: Once | ORAL | Status: AC
Start: 2021-12-22 — End: 2021-12-22
  Administered 2021-12-22: 1500 mg via ORAL

## 2021-12-22 MED ORDER — TIZANIDINE 4 MG TABLET
4.0000 mg | ORAL_TABLET | Freq: Three times a day (TID) | ORAL | 0 refills | Status: DC
Start: 2021-12-22 — End: 2024-01-03

## 2021-12-22 MED ORDER — OXYCODONE-ACETAMINOPHEN 5 MG-325 MG TABLET
2.0000 | ORAL_TABLET | ORAL | Status: AC
Start: 2021-12-22 — End: 2021-12-22
  Administered 2021-12-22: 2 via ORAL
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
## 2021-12-22 NOTE — ED Nurses Note (Signed)
Pain 3/10 at this time. Patient given written and verbal d/c instructions. All questions/concerns addressed. Informed to return with any concerns. Verbalizes understanding of d/c instructions. Patient leaving via wheelchair at this time with family. Informed of x2 prescriptions at pharmacy.
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
## 2021-12-22 NOTE — ED Nurses Note (Signed)
Pain 7/10 prior to medication administration.

## 2021-12-22 NOTE — Discharge Instructions (Addendum)
May need mri. Return if not better, worse or any concern. Follow up on thyroid nodule.
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
## 2021-12-26 ENCOUNTER — Other Ambulatory Visit (HOSPITAL_COMMUNITY): Payer: Self-pay

## 2021-12-26 DIAGNOSIS — E041 Nontoxic single thyroid nodule: Secondary | ICD-10-CM

## 2022-01-04 ENCOUNTER — Ambulatory Visit (HOSPITAL_COMMUNITY): Payer: Self-pay

## 2022-03-09 ENCOUNTER — Ambulatory Visit (HOSPITAL_COMMUNITY): Payer: Self-pay

## 2022-03-21 ENCOUNTER — Ambulatory Visit (HOSPITAL_COMMUNITY): Payer: Self-pay

## 2022-04-28 ENCOUNTER — Other Ambulatory Visit: Payer: Self-pay

## 2022-04-28 ENCOUNTER — Encounter (HOSPITAL_BASED_OUTPATIENT_CLINIC_OR_DEPARTMENT_OTHER): Payer: Self-pay

## 2022-04-28 ENCOUNTER — Emergency Department
Admission: EM | Admit: 2022-04-28 | Discharge: 2022-04-28 | Discharge status: Against Medical Advice | Payer: Medicare (Managed Care) | Attending: Emergency Medicine | Admitting: Emergency Medicine

## 2022-04-28 ENCOUNTER — Emergency Department (HOSPITAL_BASED_OUTPATIENT_CLINIC_OR_DEPARTMENT_OTHER): Payer: Medicare (Managed Care)

## 2022-04-28 DIAGNOSIS — T8141XA Infection following a procedure, superficial incisional surgical site, initial encounter: Secondary | ICD-10-CM | POA: Insufficient documentation

## 2022-04-28 DIAGNOSIS — K3184 Gastroparesis: Secondary | ICD-10-CM | POA: Insufficient documentation

## 2022-04-28 DIAGNOSIS — R1032 Left lower quadrant pain: Secondary | ICD-10-CM

## 2022-04-28 DIAGNOSIS — I110 Hypertensive heart disease with heart failure: Secondary | ICD-10-CM | POA: Insufficient documentation

## 2022-04-28 DIAGNOSIS — Z9049 Acquired absence of other specified parts of digestive tract: Secondary | ICD-10-CM | POA: Insufficient documentation

## 2022-04-28 DIAGNOSIS — T8149XA Infection following a procedure, other surgical site, initial encounter: Secondary | ICD-10-CM

## 2022-04-28 DIAGNOSIS — E1143 Type 2 diabetes mellitus with diabetic autonomic (poly)neuropathy: Secondary | ICD-10-CM | POA: Insufficient documentation

## 2022-04-28 DIAGNOSIS — R1904 Left lower quadrant abdominal swelling, mass and lump: Secondary | ICD-10-CM | POA: Insufficient documentation

## 2022-04-28 DIAGNOSIS — E7800 Pure hypercholesterolemia, unspecified: Secondary | ICD-10-CM | POA: Insufficient documentation

## 2022-04-28 DIAGNOSIS — Z5329 Procedure and treatment not carried out because of patient's decision for other reasons: Secondary | ICD-10-CM

## 2022-04-28 DIAGNOSIS — I509 Heart failure, unspecified: Secondary | ICD-10-CM | POA: Insufficient documentation

## 2022-04-28 DIAGNOSIS — J449 Chronic obstructive pulmonary disease, unspecified: Secondary | ICD-10-CM | POA: Insufficient documentation

## 2022-04-28 LAB — CBC WITH DIFF
BASOPHIL #: 0.02 10*3/uL (ref 0.00–0.30)
BASOPHIL %: 1 % (ref 0–3)
EOSINOPHIL #: 0.15 10*3/uL (ref 0.00–0.80)
EOSINOPHIL %: 3 % (ref 0–7)
HCT: 32 % — ABNORMAL LOW (ref 37.0–47.0)
HGB: 10.1 g/dL — ABNORMAL LOW (ref 12.5–16.0)
LYMPHOCYTE #: 1.33 10*3/uL (ref 1.10–5.00)
LYMPHOCYTE %: 30 % (ref 25–45)
MCH: 27 pg (ref 27.0–32.0)
MCHC: 31.7 g/dL — ABNORMAL LOW (ref 32.0–36.0)
MCV: 85.3 fL (ref 78.0–99.0)
MONOCYTE #: 0.48 10*3/uL (ref 0.00–1.30)
MONOCYTE %: 11 % (ref 0–12)
MPV: 7.4 fL (ref 7.4–10.4)
NEUTROPHIL #: 2.49 10*3/uL (ref 1.80–8.40)
NEUTROPHIL %: 56 % (ref 40–76)
PLATELETS: 519 10*3/uL — ABNORMAL HIGH (ref 140–440)
RBC: 3.75 10*6/uL — ABNORMAL LOW (ref 4.20–5.40)
RDW: 17.1 % — ABNORMAL HIGH (ref 11.6–14.8)
WBC: 4.5 10*3/uL (ref 4.0–10.5)

## 2022-04-28 LAB — COMPREHENSIVE METABOLIC PANEL, NON-FASTING
ALBUMIN/GLOBULIN RATIO: 0.6 — ABNORMAL LOW (ref 0.8–1.4)
ALBUMIN: 2.6 g/dL — ABNORMAL LOW (ref 3.4–5.0)
ALKALINE PHOSPHATASE: 102 U/L (ref 46–116)
ALT (SGPT): 28 U/L (ref <=78)
ANION GAP: 8 mmol/L (ref 4–13)
AST (SGOT): 31 U/L (ref 15–37)
BILIRUBIN TOTAL: 0.3 mg/dL (ref 0.2–1.0)
BUN/CREA RATIO: 8
BUN: 8 mg/dL (ref 7–18)
CALCIUM, CORRECTED: 10.1 mg/dL
CALCIUM: 9 mg/dL (ref 8.5–10.1)
CHLORIDE: 106 mmol/L (ref 98–107)
CO2 TOTAL: 27 mmol/L (ref 21–32)
CREATININE: 0.95 mg/dL (ref 0.55–1.02)
ESTIMATED GFR: 69 mL/min/{1.73_m2} (ref >59)
GLOBULIN: 4.4
GLUCOSE: 77 mg/dL (ref 74–106)
OSMOLALITY, CALCULATED: 278 mOsm/kg (ref 270–290)
POTASSIUM: 4.3 mmol/L (ref 3.5–5.1)
PROTEIN TOTAL: 7 g/dL (ref 6.4–8.2)
SODIUM: 141 mmol/L (ref 136–145)

## 2022-04-28 LAB — LIPASE: LIPASE: 8 U/L — ABNORMAL LOW (ref 11–82)

## 2022-04-28 LAB — PTT (PARTIAL THROMBOPLASTIN TIME): APTT: 37.5 seconds — ABNORMAL HIGH (ref 22.0–31.7)

## 2022-04-28 LAB — LACTIC ACID LEVEL W/ REFLEX FOR LEVEL >2.0: LACTIC ACID: 0.9 mmol/L (ref 0.4–2.0)

## 2022-04-28 LAB — PT/INR
INR: 1.03 (ref 0.88–1.10)
PROTHROMBIN TIME: 12 seconds (ref 9.8–12.7)

## 2022-04-28 MED ORDER — VANCOMYCIN 1,000 MG INTRAVENOUS INJECTION
20.0000 mg/kg | INTRAVENOUS | Status: AC
Start: 2022-04-28 — End: 2022-04-28
  Administered 2022-04-28: 1250 mg via INTRAVENOUS
  Administered 2022-04-28: 0 mg via INTRAVENOUS
  Filled 2022-04-28: qty 20

## 2022-04-28 MED ORDER — SODIUM CHLORIDE 0.9 % IV BOLUS
1000.0000 mL | INJECTION | Status: AC
Start: 2022-04-28 — End: 2022-04-28
  Administered 2022-04-28: 0 mL via INTRAVENOUS
  Administered 2022-04-28: 1000 mL via INTRAVENOUS

## 2022-04-28 MED ORDER — VANCOMYCIN 1,000 MG INTRAVENOUS INJECTION
INTRAVENOUS | Status: AC
Start: 2022-04-28 — End: 2022-04-28
  Filled 2022-04-28 (×2): qty 10

## 2022-04-28 MED ORDER — IOHEXOL 350 MG IODINE/ML INTRAVENOUS SOLUTION
100.0000 mL | INTRAVENOUS | Status: AC
Start: 2022-04-28 — End: 2022-04-28
  Administered 2022-04-28: 75 mL via INTRAVENOUS

## 2022-04-28 MED ORDER — HYDROMORPHONE 2 MG/ML INJECTION WRAPPER
0.5000 mg | INJECTION | INTRAMUSCULAR | Status: AC
Start: 2022-04-28 — End: 2022-04-28
  Administered 2022-04-28: 0.5 mg via INTRAVENOUS

## 2022-04-28 MED ORDER — ONDANSETRON HCL (PF) 4 MG/2 ML INJECTION SOLUTION
4.0000 mg | INTRAMUSCULAR | Status: AC
Start: 2022-04-28 — End: 2022-04-28
  Administered 2022-04-28: 4 mg via INTRAVENOUS

## 2022-04-28 MED ORDER — PIPERACILLIN-TAZOBACTAM 4.5 GRAM INTRAVENOUS SOLUTION
INTRAVENOUS | Status: AC
Start: 2022-04-28 — End: 2022-04-28
  Filled 2022-04-28: qty 20

## 2022-04-28 MED ORDER — ONDANSETRON HCL (PF) 4 MG/2 ML INJECTION SOLUTION
INTRAMUSCULAR | Status: AC
Start: 2022-04-28 — End: 2022-04-28
  Filled 2022-04-28: qty 2

## 2022-04-28 MED ORDER — SODIUM CHLORIDE 0.9 % INTRAVENOUS PIGGYBACK
4.5000 g | INTRAVENOUS | Status: AC
Start: 2022-04-28 — End: 2022-04-28
  Administered 2022-04-28: 0 g via INTRAVENOUS
  Administered 2022-04-28: 4.5 g via INTRAVENOUS

## 2022-04-28 MED ORDER — MORPHINE 4 MG/ML INJECTION WRAPPER
4.0000 mg | INJECTION | INTRAMUSCULAR | Status: AC
Start: 2022-04-28 — End: 2022-04-28
  Administered 2022-04-28: 4 mg via INTRAVENOUS

## 2022-04-28 MED ORDER — MORPHINE 4 MG/ML INJECTION WRAPPER
INJECTION | INTRAMUSCULAR | Status: AC
Start: 2022-04-28 — End: 2022-04-28
  Filled 2022-04-28: qty 1

## 2022-04-28 MED ORDER — SODIUM CHLORIDE 0.9 % INTRAVENOUS PIGGYBACK
INJECTION | INTRAVENOUS | Status: AC
Start: 2022-04-28 — End: 2022-04-28
  Filled 2022-04-28: qty 100

## 2022-04-28 MED ORDER — HYDROMORPHONE 2 MG/ML INJECTION WRAPPER
INJECTION | INTRAMUSCULAR | Status: AC
Start: 2022-04-28 — End: 2022-04-28
  Filled 2022-04-28: qty 1

## 2022-04-28 NOTE — ED Attending Note (Signed)
Patient is awake alert coherent able to make decisions for herself.  Patient has decided to leave against medical advice with her husband also agreeing.  Patient states she will follow-up with her general surgeon at [facility] who did her recent hernia repair.  Patient advised to return to the emergency room if symptoms worsen

## 2022-04-28 NOTE — ED Nurses Note (Signed)
MD informed patient wants to sign AMA to go to [place].  Patient again educated on importance of continued care and need for further tx.  Verbalized understanding. AMA form signed by patient.  IV DC. Cath intact.  No redness or edema at site.  VSS.  Ambulatory out of ER with husband.

## 2022-04-28 NOTE — ED Nurses Note (Signed)
Per MD, no beds available at [facility].  Patient verbalized understanding and stated she will drive herself to see her surgeon.  Verbalized understanding of risks of AMA.  Husband at bedside and also aware of risks.

## 2022-04-28 NOTE — ED Provider Notes (Signed)
[facility], [facility] Emergency Department  ED Primary Provider Note  History of Present Illness   Chief Complaint   Patient presents with    Wound Infection     Martha White is a 57 y.o. female who had concerns including Wound Infection.  Arrival: The patient arrived by Car complaining having drainage from her wound from her hernia surgery on the 29th January at [facility] by Dr. Vernard Gambles.  She states she suddenly just saw greenish yellow discharge from the wound this afternoon.  Patient states she has been having chills but has not had a temp.  She is complaining of severe pain throughout her abdomen.  She noticed a large lump with warmth of the skin and severe tenderness.  Patient had adhesions 3 months ago on the right side of her abdomen where Dr. Vernard Gambles went in and remove the adhesions and developed large infection where he had to go back in and remove the infection.  Patient does have history of hypertension, diabetes, and high cholesterol.  Patient does have gastroparesis and has had 90% of her gastrium removed.  Patient denies ever smoking.  Patient denies any alcohol use.  Patient also has a history of COPD and congestive heart failure.  Patient has had a hysterectomy as well as cholecystectomy as well.  Patient was placed on amoxicillin 875 on Tuesday after being seen by Dr. Vernard Gambles at [facility].  He Told her she had a small infection that had started.  Patient states she did not have any imaging or White work done at the time.  Patient denies any nausea vomiting or diarrhea.  Patient denies any dysuria or increased frequency.  {Use the HPI, ROS, Phys Exam, & MDM tabs at the top of the note composer to access the Notewriter SmartBlocks for the respective sections as needed. As of Mar 19, 2021 you are only required to have a "medically appropriate" History, ROS, and PE for billing purposes. Do not modify this italicized text, it will disappear  upon signing your note:123}  HPI  Review of Systems   Review of Systems   Constitutional:  Positive for activity change and appetite change. Negative for chills and fever.   HENT:  Negative for ear pain and sore throat.    Eyes:  Negative for pain and visual disturbance.   Respiratory:  Negative for cough and shortness of breath.    Cardiovascular:  Negative for chest pain and palpitations.   Gastrointestinal:  Positive for abdominal pain and nausea. Negative for vomiting.   Genitourinary:  Negative for dysuria and hematuria.   Musculoskeletal:  Negative for arthralgias and back pain.   Skin:  Positive for wound. Negative for color change and rash.   Neurological:  Negative for seizures and syncope.   All other systems reviewed and are negative.     Historical Data   History Reviewed This Encounter:     Physical Exam   ED Triage Vitals [04/28/22 1701]   BP (Non-Invasive) (!) 134/94   Heart Rate 95   Respiratory Rate 18   Temperature 36.6 C (97.8 F)   SpO2 100 %   Weight    Height      Physical Exam  Vitals and nursing note reviewed.   Constitutional:       General: She is not in acute distress.     Appearance: She is well-developed. She is obese. She is ill-appearing and toxic-appearing.   HENT:  Head: Normocephalic and atraumatic.      Right Ear: External ear normal.      Left Ear: External ear normal.      Nose: Nose normal.      Mouth/Throat:      Mouth: Mucous membranes are dry.   Eyes:      Extraocular Movements: Extraocular movements intact.      Conjunctiva/sclera: Conjunctivae normal.      Pupils: Pupils are equal, round, and reactive to light.   Cardiovascular:      Rate and Rhythm: Normal rate and regular rhythm.      Pulses: Normal pulses.      Heart sounds: Normal heart sounds. No murmur heard.  Pulmonary:      Effort: Pulmonary effort is normal. No respiratory distress.      Breath sounds: Normal breath sounds.   Abdominal:      Palpations: Abdomen is soft.      Tenderness: There is abdominal  tenderness. There is guarding.      Comments: Positive tenderness with palpable mass 6 cm x 4 cm in the left lower quadrant just below the incisional scar.  Small area of 1 cm has dehisced in his draining yellow pustular material.  Patient does have guarding and rebound with palpation.  Bowel sounds are diminished in the left lower quadrant.   Musculoskeletal:         General: No swelling. Normal range of motion.      Cervical back: Normal range of motion and neck supple.   Skin:     General: Skin is warm and dry.      Capillary Refill: Capillary refill takes less than 2 seconds.   Neurological:      General: No focal deficit present.      Mental Status: She is alert and oriented to person, place, and time.   Psychiatric:         Mood and Affect: Mood normal.         Behavior: Behavior normal.         Thought Content: Thought content normal.       Patient Data   {Click here to open the ED Workup Activity for clinical data review *This link will automatically disappear upon signing your note*:123}  Labs Ordered/Reviewed   CBC WITH DIFF - Abnormal; Notable for the following components:       Result Value    RBC 3.75 (*)     HGB 10.1 (*)     HCT 32.0 (*)     MCHC 31.7 (*)     RDW 17.1 (*)     PLATELETS 519 (*)     All other components within normal limits   WOUND, SUPERFICIAL/NON-STERILE SITE, AEROBIC CULTURE AND GRAM STAIN   ANAEROBIC CULTURE   CBC/DIFF    Narrative:     The following orders were created for panel order CBC/DIFF.  Procedure                               Abnormality         Status                     ---------                               -----------         ------  CBC WITH MD:[id number]                Abnormal            Final result                 Please view results for these tests on the individual orders.   COMPREHENSIVE METABOLIC PANEL, NON-FASTING   LIPASE   LACTIC ACID LEVEL W/ REFLEX FOR LEVEL >2.0   PT/INR   PTT (PARTIAL THROMBOPLASTIN TIME)   URINALYSIS WITH REFLEX  MICROSCOPIC AND CULTURE IF POSITIVE    Narrative:     The following orders were created for panel order URINALYSIS WITH REFLEX MICROSCOPIC AND CULTURE IF POSITIVE.  Procedure                               Abnormality         Status                     ---------                               -----------         ------                     URINALYSIS, MACRO/MICRO[[id number]]                                                       Please view results for these tests on the individual orders.   URINALYSIS, MACRO/MICRO     No orders to display     Medical Decision Making      {Be sure to fill out the MDM SmartBlock in Notewriter to the left. Do not modify this italicized text, it will disappear upon signing your note:123}  Medical Decision Making  Patient is 57 year old black female with multiple abdominal surgeries in the past for adhesions and hernia repair as well as gastrectomy, cholecystectomy, and hysterectomy.  Presents with large very tender mass in her left lower quadrant under the incisional scar.  Patient recently hernia repair on 29th of January.  Patient noticed increased swelling and tenderness 5 days ago so decided to see Dr. Vernard Gambles at [facility].  She was put on amoxicillin 875 mg for this infection.  She was told to come back in a week.  Patient saw Dr. Vernard Gambles on Tuesday.  She stated no imaging or labs were done.  Patient now presents with severe tenderness with guarding and rebound of left lower quadrant.  Patient will have an IV placed with hydration.  Patient will also have labs and CT scan of the abdomen pelvis with IV contrast.  Patient will also be given Zosyn 4.5 g as well as vancomycin.  Patient was given analgesia for morphine.  Patient will then be sent back to [facility] to see Dr. Vernard Gambles and be admitted.      Care of the patient was handed off to Dr. elder at 7:30 p.m.Marland Kitchen  Please refer to their ED course note for further details on patient's ED course, further  imaging, ultimately patient disposition.    Amount and/or Complexity of Data Reviewed  Labs: ordered.  Radiology: ordered.  ECG/medicine tests: ordered.     Details: Normal sinus rhythm 89, PR interval 164 MS, QT interval 336 MS, LVH, flat T-waves 3, AVF,    Risk  Prescription drug management.  Parenteral controlled substances.             Medications Administered in the ED   NS bolus infusion 1,000 mL (1,000 mL Intravenous New Bag/New Syringe 04/28/22 1845)   vancomycin (VANCOCIN) 20 mg/kg in NS 500 mL IVPB (has no administration in time range)   ondansetron (ZOFRAN) 2 mg/mL injection (4 mg Intravenous Given 04/28/22 1820)   morphine 4 mg/mL injection (4 mg Intravenous Given 04/28/22 1820)   piperacillin-tazobactam (ZOSYN) 4.5 g in NS 100 mL IVPB minibag (4.5 g Intravenous New Bag/New Syringe 04/28/22 1820)     Clinical Impression   Left lower quadrant abdominal pain (Primary)   Abscess of postoperative wound of abdominal wall   Wound infection after surgery       Disposition: Data Unavailable       {Remember to refresh your note prior to signing. Use Control + [id number] or click the refresh button at the bottom of the note. This reminder text will a  utomatically disappear when you sign your note.:123}        Clinical Impression   Left lower quadrant abdominal pain (Primary)   Abscess of postoperative wound of abdominal wall   Wound infection after surgery       Current Discharge Medication List

## 2022-04-28 NOTE — ED Triage Notes (Addendum)
Pt states, "I recently had hernia repair 1/29. The wound became infected . I'm on antibiotics.  About 30 min ago the wound opened up. "

## 2022-04-29 DIAGNOSIS — I499 Cardiac arrhythmia, unspecified: Secondary | ICD-10-CM

## 2022-04-29 LAB — ECG 12 LEAD
Atrial Rate: 89 {beats}/min
Calculated P Axis: 40 degrees
Calculated R Axis: -8 degrees
Calculated T Axis: 23 degrees
PR Interval: 164 ms
QRS Duration: 70 ms
QT Interval: 336 ms
QTC Calculation: 408 ms
Ventricular rate: 89 {beats}/min

## 2022-05-02 LAB — WOUND, SUPERFICIAL/NON-STERILE SITE, AEROBIC CULTURE AND GRAM STAIN: GRAM STAIN: NONE SEEN

## 2022-05-02 NOTE — Result Encounter Note (Signed)
05/02/2022 0840 called  to discuss wound culture result, they report pt has been discharged yest

## 2022-05-02 NOTE — Result Encounter Note (Signed)
Called and spoke with pt via phone, she states that [facility] did not discharge her home on any new antibiotics, told her to continue with the Amoxil that she was given previously. Pt reports she thinks she threw the Amoxil RX away. Spoke with dr Riki Sheer and he states to call in Rx Amoxil 500 mg po 2 tab BID #40. Pt requests rx called to [facility]. Rx called to pharmacist 05/02/2022 1130

## 2022-05-03 LAB — ANAEROBIC CULTURE

## 2022-05-16 ENCOUNTER — Emergency Department (HOSPITAL_BASED_OUTPATIENT_CLINIC_OR_DEPARTMENT_OTHER): Payer: Medicare (Managed Care)

## 2022-05-16 ENCOUNTER — Other Ambulatory Visit: Payer: Self-pay

## 2022-05-16 ENCOUNTER — Encounter (HOSPITAL_BASED_OUTPATIENT_CLINIC_OR_DEPARTMENT_OTHER): Payer: Self-pay

## 2022-05-16 ENCOUNTER — Emergency Department
Admission: EM | Admit: 2022-05-16 | Discharge: 2022-05-16 | Disposition: A | Discharge status: Home / Self Care | Payer: Medicare (Managed Care) | Attending: Family | Admitting: Family

## 2022-05-16 DIAGNOSIS — D649 Anemia, unspecified: Secondary | ICD-10-CM | POA: Insufficient documentation

## 2022-05-16 DIAGNOSIS — R109 Unspecified abdominal pain: Secondary | ICD-10-CM | POA: Insufficient documentation

## 2022-05-16 DIAGNOSIS — Z8719 Personal history of other diseases of the digestive system: Secondary | ICD-10-CM | POA: Insufficient documentation

## 2022-05-16 DIAGNOSIS — R140 Abdominal distension (gaseous): Secondary | ICD-10-CM | POA: Insufficient documentation

## 2022-05-16 DIAGNOSIS — Z9889 Other specified postprocedural states: Secondary | ICD-10-CM | POA: Insufficient documentation

## 2022-05-16 LAB — MANUAL DIFFERENTIAL
EOSINOPHIL %: 2 % (ref 0–7)
EOSINOPHIL ABSOLUTE: 0.08 10*3/uL (ref 0.00–0.80)
EOSINOPHILS MANUAL: 2
LYMPHOCYTE %: 35 % (ref 25–45)
LYMPHOCYTE ABSOLUTE: 1.37 10*3/uL (ref 1.10–5.00)
LYMPHOCYTES MANUAL: 35
MONOCYTE %: 6 % (ref 0–12)
MONOCYTE ABSOLUTE: 0.23 10*3/uL (ref 0.00–1.30)
MONOCYTES MANUAL: 6
NEUTROPHIL %: 57 % (ref 40–76)
NEUTROPHIL ABSOLUTE: 2.22 10*3/uL (ref 1.80–8.40)
NEUTROPHILS MANUAL: 57
PLATELET MORPHOLOGY COMMENT: ADEQUATE
TOTAL CELLS COUNTED [#] IN BLOOD: 100
WBC: 3.9 10*3/uL

## 2022-05-16 LAB — COMPREHENSIVE METABOLIC PANEL, NON-FASTING
ALBUMIN/GLOBULIN RATIO: 0.7 — ABNORMAL LOW (ref 0.8–1.4)
ALBUMIN: 3.2 g/dL — ABNORMAL LOW (ref 3.4–5.0)
ALKALINE PHOSPHATASE: 108 U/L (ref 46–116)
ALT (SGPT): 57 U/L (ref <=78)
ANION GAP: 9 mmol/L (ref 4–13)
AST (SGOT): 54 U/L — ABNORMAL HIGH (ref 15–37)
BILIRUBIN TOTAL: 0.2 mg/dL (ref 0.2–1.0)
BUN/CREA RATIO: 11
BUN: 11 mg/dL (ref 7–18)
CALCIUM, CORRECTED: 9.6 mg/dL
CALCIUM: 9 mg/dL (ref 8.5–10.1)
CHLORIDE: 103 mmol/L (ref 98–107)
CO2 TOTAL: 28 mmol/L (ref 21–32)
CREATININE: 1.02 mg/dL (ref 0.55–1.02)
ESTIMATED GFR: 63 mL/min/{1.73_m2} (ref >59)
GLOBULIN: 4.6
GLUCOSE: 95 mg/dL (ref 74–106)
OSMOLALITY, CALCULATED: 279 mOsm/kg (ref 270–290)
POTASSIUM: 3.5 mmol/L (ref 3.5–5.1)
PROTEIN TOTAL: 7.8 g/dL (ref 6.4–8.2)
SODIUM: 140 mmol/L (ref 136–145)

## 2022-05-16 LAB — CBC WITH DIFF
HCT: 34.9 % — ABNORMAL LOW (ref 37.0–47.0)
HGB: 11.2 g/dL — ABNORMAL LOW (ref 12.5–16.0)
MCH: 27 pg (ref 27.0–32.0)
MCHC: 32.1 g/dL (ref 32.0–36.0)
MCV: 84.1 fL (ref 78.0–99.0)
MPV: 7.9 fL (ref 7.4–10.4)
PLATELETS: 310 10*3/uL (ref 140–440)
RBC: 4.15 10*6/uL — ABNORMAL LOW (ref 4.20–5.40)
RDW: 17.6 % — ABNORMAL HIGH (ref 11.6–14.8)
WBC: 3.9 10*3/uL — ABNORMAL LOW (ref 4.0–10.5)

## 2022-05-16 LAB — LACTIC ACID LEVEL W/ REFLEX FOR LEVEL >2.0: LACTIC ACID: 1.8 mmol/L (ref 0.4–2.0)

## 2022-05-16 MED ORDER — KETOROLAC 30 MG/ML (1 ML) INJECTION SOLUTION
INTRAMUSCULAR | Status: AC
Start: 2022-05-16 — End: 2022-05-16
  Filled 2022-05-16: qty 1

## 2022-05-16 MED ORDER — SODIUM CHLORIDE 0.9 % IV BOLUS
1000.0000 mL | INJECTION | Status: AC
Start: 2022-05-16 — End: 2022-05-16
  Administered 2022-05-16: 1000 mL via INTRAVENOUS
  Administered 2022-05-16: 0 mL via INTRAVENOUS

## 2022-05-16 MED ORDER — KETOROLAC 30 MG/ML (1 ML) INJECTION SOLUTION
15.0000 mg | INTRAMUSCULAR | Status: AC
Start: 2022-05-16 — End: 2022-05-16
  Administered 2022-05-16: 15 mg via INTRAVENOUS

## 2022-05-16 MED ORDER — MORPHINE 2 MG/ML INJECTION WRAPPER
2.0000 mg | INJECTION | INTRAMUSCULAR | Status: AC
Start: 2022-05-16 — End: 2022-05-16
  Administered 2022-05-16: 2 mg via INTRAVENOUS

## 2022-05-16 MED ORDER — FERROUS SULFATE 325 MG (65 MG IRON) TABLET
325.0000 mg | ORAL_TABLET | Freq: Every day | ORAL | 0 refills | Status: AC
Start: 2022-05-16 — End: 2022-06-15

## 2022-05-16 MED ORDER — IOHEXOL 350 MG IODINE/ML INTRAVENOUS SOLUTION
100.0000 mL | INTRAVENOUS | Status: AC
Start: 2022-05-16 — End: 2022-05-16
  Administered 2022-05-16: 75 mL via INTRAVENOUS

## 2022-05-16 MED ORDER — MORPHINE 2 MG/ML INJECTION WRAPPER
INJECTION | INTRAMUSCULAR | Status: AC
Start: 2022-05-16 — End: 2022-05-16
  Filled 2022-05-16: qty 1

## 2022-05-16 NOTE — ED Nurses Note (Signed)
Patient discharged home with family. Reviewed instructions and prescriptions with patient. Questions sufficiently answered as needed. Patient verbalized understanding of instructions and prescriptions. Patient encouraged to follow up with PCP as indicated. In the event of an emergency, patient instructed to call 911 or go to the nearest emergency room. Patient ambulated off the unit with spouse.

## 2022-05-16 NOTE — ED Triage Notes (Signed)
Patient states she had an abd abscess removed and now she has another one right above it.  States she had before surgery and was told it was not bad enough to do surgery on.but has gotten worse. Also right shoulder and down her arm hurts.  States both arms and legs hurt when she voids.

## 2022-05-16 NOTE — ED Nurses Note (Signed)
Pt verbalized relief of pain. Patient resting in bed with spouse at bedside. Denies any needs at this time. No acute distress noted.

## 2022-05-16 NOTE — ED Nurses Note (Signed)
Patient requesting medication for pain relief. Rated pain 8/10. Notified Tyrell Antonio, NP. Orders pending.

## 2022-05-16 NOTE — ED Nurses Note (Signed)
Pt c/o left abdominal pain d/t abdominal abscess, pain more severe today. Stated recent hernia repair sx which caused abdominal abscess. LLQ incisional drainage opening (see notes) for abscess to "drain." Pt stated chills, unsure of fever with nausea. Stated new abscess formed above "other abscess."

## 2022-05-16 NOTE — Discharge Instructions (Addendum)
Take iron daily as directed.  Increase fluids.  Take Gas-X over-the-counter daily, walk as frequently as possible..  Call surgeon tomorrow to move up appointment sooner than April.  If condition worsens or does not improve return to the emergency

## 2022-05-16 NOTE — ED Provider Notes (Signed)
[facility], [facility] Emergency Department  ED Primary Provider Note  History of Present Illness   Chief Complaint   Patient presents with    Abscess     Martha White is a 57 y.o. female who had concerns including Abscess.  Arrival: The patient arrived by Car    Patient arrived ambulatory to the emergency room with complaints of pain of her abdominal area where she recently had hernia repair surgery.  Patient had hernia surgery at [facility] 04/16/2022 by Dr.Shen.  She states that she developed an abscess 2 weeks and it was lanced by him, there is approximately a 1 in open laceration to the left lower abdomen.  Patient complains of pain above this area and states that she was told that she had a 2nd abscess present when she saw him at that time        History Reviewed This Encounter: Medical History  Surgical History  Family History  Social History    Physical Exam   ED Triage Vitals [05/16/22 1749]   BP (Non-Invasive) (!) 145/91   Heart Rate (!) 113   Respiratory Rate 18   Temperature 36.8 C (98.2 F)   SpO2 100 %   Weight 68.9 kg (152 lb)   Height 1.499 m ('4\' 11"'$ )     Physical Exam  Vitals and nursing note reviewed.   Constitutional:       General: She is not in acute distress.     Appearance: She is well-developed.   HENT:      Head: Normocephalic and atraumatic.   Eyes:      Conjunctiva/sclera: Conjunctivae normal.   Cardiovascular:      Rate and Rhythm: Normal rate and regular rhythm.      Heart sounds: No murmur heard.  Pulmonary:      Effort: Pulmonary effort is normal. No respiratory distress.      Breath sounds: Normal breath sounds.   Abdominal:      General: Bowel sounds are normal.      Palpations: Abdomen is soft.      Tenderness: There is abdominal tenderness (Open surgical laceration for drainage present left lower abdomen,).   Musculoskeletal:         General: No swelling.      Cervical back: Neck supple.   Skin:     General: Skin is warm and  dry.      Capillary Refill: Capillary refill takes less than 2 seconds.   Neurological:      Mental Status: She is alert.   Psychiatric:         Mood and Affect: Mood normal.       Patient Data     Labs Ordered/Reviewed   COMPREHENSIVE METABOLIC PANEL, NON-FASTING - Abnormal; Notable for the following components:       Result Value    ALBUMIN 3.2 (*)     AST (SGOT) 54 (*)     ALBUMIN/GLOBULIN RATIO 0.7 (*)     All other components within normal limits    Narrative:     Estimated Glomerular Filtration Rate (eGFR) is calculated using the CKD-EPI (2021) equation, intended for patients 65 years of age and older. If gender is not documented or "unknown", there will be no eGFR calculation.   CBC WITH DIFF - Abnormal; Notable for the following components:    WBC 3.9 (*)     RBC 4.15 (*)     HGB 11.2 (*)  HCT 34.9 (*)     RDW 17.6 (*)     All other components within normal limits   LACTIC ACID LEVEL W/ REFLEX FOR LEVEL >2.0 - Normal   CBC/DIFF    Narrative:     The following orders were created for panel order CBC/DIFF.  Procedure                               Abnormality         Status                     ---------                               -----------         ------                     CBC WITH [id number]                Abnormal            Final result               MANUAL DIFFERENTIAL[[id number]]                              Final result                 Please view results for these tests on the individual orders.   MANUAL DIFFERENTIAL     CT ABDOMEN PELVIS W IV CONTRAST   Final Result by Edi, Radresults In (02/28 2024)   No intra-abdominal abscess.      There are postsurgical changes to the left anterior abdominal wall without abscess or subcutaneous air.      There is gaseous distention of the colon and there are fluid-filled distended loops of small bowel. Findings could represent ileus.          One or more dose reduction techniques were used (e.g., Automated exposure control, adjustment of the mA and/or  kV according to patient size, use of iterative reconstruction technique).         Radiologist location ID: [place] Decision Making        Medical Decision Making  Patient arrived ambulatory to the emergency room with complaints of pain of her abdominal area where she recently had hernia repair surgery.  Patient had hernia surgery at [facility] 04/16/2022 by Dr.Shen.  She states that she developed an abscess 2 weeks ago and it was lanced by him, there is approximately a 1 in open laceration to the left lower abdomen.  Patient complains of pain above this area and states that she was told that she had a 2nd abscess present when she saw him at that time.  Differential diagnosis includes but is not limited to skin infection, abdominal abscess, cellulitis, obstruction, enteritis..  Patient is anemic but improved from 2 weeks ago.  Rx for iron given for iron.  CT of abdomen with contrast shows no abscess, or obstructions.  Patient is to call surgeon to move up her follow up appointment to a sooner date.  Patient is to return to the ED if condition worsens or does not improve,  Amount and/or Complexity of Data Reviewed  Labs: ordered.  Radiology: ordered.    Risk  Prescription drug management.  Parenteral controlled substances.             Medications Administered in the ED   NS bolus infusion 1,000 mL (0 mL Intravenous Stopped 05/16/22 2008)   ketorolac (TORADOL) 30 mg/mL injection (15 mg Intravenous Given 05/16/22 1908)   iohexol (OMNIPAQUE 350) infusion (75 mL Intravenous Given 05/16/22 1959)   morphine 2 mg/mL injection (2 mg Intravenous Given 05/16/22 2023)     Clinical Impression   Abdominal pain, unspecified abdominal location (Primary)   Anemia, unspecified type       Disposition: Discharged

## 2022-05-17 ENCOUNTER — Inpatient Hospital Stay
Admission: RE | Admit: 2022-05-17 | Discharge: 2022-05-17 | Disposition: A | Discharge status: Home / Self Care | Payer: Medicare (Managed Care) | Source: Ambulatory Visit

## 2022-05-17 DIAGNOSIS — E041 Nontoxic single thyroid nodule: Secondary | ICD-10-CM

## 2022-05-18 ENCOUNTER — Encounter (HOSPITAL_COMMUNITY): Payer: Self-pay

## 2022-05-18 ENCOUNTER — Other Ambulatory Visit (HOSPITAL_COMMUNITY): Payer: Self-pay

## 2022-05-18 DIAGNOSIS — R9389 Abnormal findings on diagnostic imaging of other specified body structures: Secondary | ICD-10-CM

## 2022-05-25 ENCOUNTER — Inpatient Hospital Stay (HOSPITAL_COMMUNITY)
Admission: RE | Admit: 2022-05-25 | Discharge: 2022-05-25 | Disposition: A | Discharge status: Home / Self Care | Payer: Medicare (Managed Care) | Source: Ambulatory Visit

## 2022-05-25 ENCOUNTER — Ambulatory Visit
Admission: RE | Admit: 2022-05-25 | Discharge: 2022-05-25 | Disposition: A | Discharge status: Home / Self Care | Payer: Medicare (Managed Care) | Source: Ambulatory Visit | Attending: NURSE PRACTITIONER | Admitting: NURSE PRACTITIONER

## 2022-05-25 ENCOUNTER — Other Ambulatory Visit: Payer: Self-pay

## 2022-05-25 DIAGNOSIS — R7401 Elevation of levels of liver transaminase levels: Secondary | ICD-10-CM | POA: Insufficient documentation

## 2022-06-11 ENCOUNTER — Telehealth (HOSPITAL_COMMUNITY): Payer: Self-pay | Admitting: Vascular & Interventional Radiology

## 2022-06-11 NOTE — OR PreOp (Signed)
LEFT MESSAGE FOR PATIENT TO RETURN CALL REGARDING IR PROCEDURE ON 06/12/22.

## 2022-06-11 NOTE — OR PreOp (Signed)
Patient is allergic to Sulfa, the flu vaccine and pneumonia vaccine.   Patient takes Aspirin but has been off of it since February 10th.   Patient takes Trulicity but has been off of it for "going on two weeks."  No previous reaction to anesthesia or conscious sedation and no family history of reaction to either one.   Patient acknowledges that she needs to have someone drive her home tomorrow.

## 2022-06-12 ENCOUNTER — Encounter (HOSPITAL_COMMUNITY): Payer: Self-pay

## 2022-06-12 ENCOUNTER — Other Ambulatory Visit: Payer: Self-pay

## 2022-06-12 ENCOUNTER — Inpatient Hospital Stay
Admission: RE | Admit: 2022-06-12 | Discharge: 2022-06-12 | Disposition: A | Discharge status: Home / Self Care | Payer: Medicare (Managed Care) | Source: Ambulatory Visit

## 2022-06-12 DIAGNOSIS — E079 Disorder of thyroid, unspecified: Secondary | ICD-10-CM

## 2022-06-12 DIAGNOSIS — R9389 Abnormal findings on diagnostic imaging of other specified body structures: Secondary | ICD-10-CM | POA: Insufficient documentation

## 2022-06-12 DIAGNOSIS — E041 Nontoxic single thyroid nodule: Secondary | ICD-10-CM | POA: Insufficient documentation

## 2022-06-12 MED ORDER — LIDOCAINE HCL 20 MG/ML (2 %) INJECTION SOLUTION
Freq: Once | INTRAMUSCULAR | Status: AC | PRN
Start: 2022-06-12 — End: 2022-06-12
  Administered 2022-06-12: 3 mL via INTRADERMAL

## 2022-06-12 MED ORDER — FENTANYL (PF) 50 MCG/ML INJECTION SOLUTION
INTRAMUSCULAR | Status: AC
Start: 2022-06-12 — End: 2022-06-12
  Filled 2022-06-12: qty 2

## 2022-06-12 MED ORDER — HYDROCODONE 7.5 MG-ACETAMINOPHEN 325 MG TABLET
1.0000 | ORAL_TABLET | ORAL | Status: DC | PRN
Start: 2022-06-12 — End: 2022-06-13
  Administered 2022-06-12: 1 via ORAL

## 2022-06-12 MED ORDER — MIDAZOLAM 5 MG/ML INJECTION WRAPPER
INTRAMUSCULAR | Status: AC
Start: 2022-06-12 — End: 2022-06-12
  Filled 2022-06-12: qty 2

## 2022-06-12 MED ORDER — MIDAZOLAM 5 MG/ML INJECTION WRAPPER
Freq: Once | INTRAMUSCULAR | Status: AC | PRN
Start: 2022-06-12 — End: 2022-06-12
  Administered 2022-06-12 (×2): 1 mg via INTRAVENOUS
  Administered 2022-06-12: 2 mg via INTRAVENOUS

## 2022-06-12 MED ORDER — FENTANYL (PF) 50 MCG/ML INJECTION WRAPPER
INJECTION | Freq: Once | INTRAMUSCULAR | Status: AC | PRN
Start: 2022-06-12 — End: 2022-06-12
  Administered 2022-06-12 (×2): 25 ug via INTRAVENOUS

## 2022-06-12 MED ORDER — SODIUM CHLORIDE 0.9 % INTRAVENOUS SOLUTION
INTRAVENOUS | Status: DC
Start: 2022-06-12 — End: 2022-06-13

## 2022-06-12 MED ORDER — HYDROCODONE 7.5 MG-ACETAMINOPHEN 325 MG TABLET
ORAL_TABLET | ORAL | Status: AC
Start: 2022-06-12 — End: 2022-06-12
  Filled 2022-06-12: qty 1

## 2022-06-12 NOTE — Discharge Instructions (Addendum)
SURGICAL DISCHARGE INSTRUCTIONS     Dr. Harolyn Rutherford performed your Fine Needle Aspiration of your Left Thyroid today in the Radiology Dept at [facility]:  Monday through Friday from 8 a.m. - 4 p.m.  For T&D: [phone]  Between 4 p.m. - 8 a.m., weekends and holidays:  Call ER [phone]    PLEASE SEE WRITTEN HANDOUTS AS DISCUSSED BY YOUR NURSE:  Martha White    SIGNS AND SYMPTOMS OF A WOUND / INCISION INFECTION   Be sure to watch for the following:  Increase in redness or red streaks near or around the wound or incision.  Increase in pain that is intense or severe and cannot be relieved by the pain medication that your doctor has given you.  Increase in swelling that cannot be relieved by elevation of a body part, or by applying ice, if permitted.  Increase in drainage, or if yellow / green in color and smells bad. This could be on a dressing or a cast.  Increase in fever for longer than 24 hours, or an increase that is higher than 101 degrees Fahrenheit (normal body temperature is 98 degrees Fahrenheit). The incision may feel warm to the touch.    **CALL YOUR DOCTOR IF ONE OR MORE OF THESE SIGNS / SYMPTOMS SHOULD OCCUR.    ANESTHESIA INFORMATION   LOCAL ANESTHETIC:  You have receieved a local anesthetic, the effects should disappear in a few hours.    REMEMBER   If you experience any difficulty breathing, chest pain, bleeding that you feel is excessive, persistent nausea or vomiting or for any other concerns:  Call your physician Dr. Harolyn Rutherford at [phone] . You may also ask to have the general doctor on call paged. They are available to you 24 hours a day.        FOLLOW-UP APPOINTMENTS   Please keep your already scheduled follow up appointment with Martha White.    Dr Weston Anna [phone]

## 2022-06-12 NOTE — Brief Op Note (Signed)
06/12/22      Radiologist: Dr. Lamar Blinks    Procedure: [place] FNA THYROID    Description of Procedure Findings:      Left thyroid nodule        Estimated Blood Loss:  1.0 mL        Specimen Collected:  6 FNA specimens            Diagnosis: Abnormal radiographic examination; as above; dictated report to follow      Lamar Blinks, MD

## 2022-06-13 DIAGNOSIS — E041 Nontoxic single thyroid nodule: Secondary | ICD-10-CM

## 2022-06-13 LAB — CYTOPATHOLOGY, FINE NEEDLE ASPIRATE

## 2022-06-14 NOTE — Result Encounter Note (Signed)
Patient notified of provider notes and voiced understanding.    Patient gave this nurse her PCP phone number.  [phone].    Called and spoke with Nurse Candace.  Gave info about results.  They will pull info off of EPIC they have been waiting for results.    Naylene Foell, LPN

## 2022-06-19 ENCOUNTER — Other Ambulatory Visit (HOSPITAL_COMMUNITY): Payer: Self-pay

## 2022-06-19 DIAGNOSIS — R9389 Abnormal findings on diagnostic imaging of other specified body structures: Secondary | ICD-10-CM

## 2022-06-28 ENCOUNTER — Ambulatory Visit (HOSPITAL_COMMUNITY): Payer: Self-pay

## 2022-07-05 NOTE — OR PreOp (Signed)
Pt is allergic to sulfa, "drinking alcohol", flu and pneumonia vaccine.   Pt takes Plavix and aspirin 81 mg but has been off both medications over a month.   Pt takes Trulicity but has been off for 3 weeks.   Pt instructed to take heart and BP medications.  No previous reaction to anesthesia and no family history of problems with anesthesia.   Pt will be NPO after midnight.  Pt will have a driver day of procedure.

## 2022-07-06 ENCOUNTER — Other Ambulatory Visit: Payer: Self-pay

## 2022-07-06 ENCOUNTER — Inpatient Hospital Stay
Admission: RE | Admit: 2022-07-06 | Discharge: 2022-07-06 | Disposition: A | Discharge status: Home / Self Care | Payer: Medicare (Managed Care) | Source: Ambulatory Visit

## 2022-07-06 ENCOUNTER — Encounter (HOSPITAL_COMMUNITY): Payer: Self-pay

## 2022-07-06 DIAGNOSIS — E079 Disorder of thyroid, unspecified: Secondary | ICD-10-CM | POA: Insufficient documentation

## 2022-07-06 DIAGNOSIS — R9389 Abnormal findings on diagnostic imaging of other specified body structures: Secondary | ICD-10-CM

## 2022-07-06 DIAGNOSIS — E041 Nontoxic single thyroid nodule: Secondary | ICD-10-CM | POA: Insufficient documentation

## 2022-07-06 LAB — POC BLOOD GLUCOSE (RESULTS)
GLUCOSE, POC: 67 mg/dl — ABNORMAL LOW (ref 70–100)
GLUCOSE, POC: 105 mg/dl — ABNORMAL HIGH (ref 70–100)

## 2022-07-06 MED ORDER — DEXTROSE 50 % IN WATER (D50W) INTRAVENOUS SYRINGE
INJECTION | INTRAVENOUS | Status: AC
Start: 2022-07-06 — End: 2022-07-06
  Filled 2022-07-06: qty 50

## 2022-07-06 MED ORDER — MIDAZOLAM 5 MG/ML INJECTION WRAPPER
Freq: Once | INTRAMUSCULAR | Status: AC | PRN
Start: 2022-07-06 — End: 2022-07-06
  Administered 2022-07-06 (×2): 1 mg via INTRAVENOUS

## 2022-07-06 MED ORDER — FENTANYL (PF) 50 MCG/ML INJECTION WRAPPER
INJECTION | Freq: Once | INTRAMUSCULAR | Status: AC | PRN
Start: 2022-07-06 — End: 2022-07-06
  Administered 2022-07-06 (×2): 25 ug via INTRAVENOUS

## 2022-07-06 MED ORDER — SODIUM CHLORIDE 0.9 % INTRAVENOUS SOLUTION
INTRAVENOUS | Status: DC
Start: 2022-07-06 — End: 2022-07-07

## 2022-07-06 MED ORDER — DEXTROSE 50 % IN WATER (D50W) INTRAVENOUS SYRINGE
12.5000 g | INJECTION | Freq: Once | INTRAVENOUS | Status: AC
Start: 2022-07-06 — End: 2022-07-06
  Administered 2022-07-06: 25 mL via INTRAVENOUS
  Filled 2022-07-06: qty 50

## 2022-07-06 MED ORDER — FENTANYL (PF) 50 MCG/ML INJECTION SOLUTION
INTRAMUSCULAR | Status: AC
Start: 2022-07-06 — End: 2022-07-06
  Filled 2022-07-06: qty 2

## 2022-07-06 MED ORDER — MIDAZOLAM 5 MG/ML INJECTION WRAPPER
INTRAMUSCULAR | Status: AC
Start: 2022-07-06 — End: 2022-07-06
  Filled 2022-07-06: qty 1

## 2022-07-06 NOTE — Discharge Instructions (Addendum)
SURGICAL DISCHARGE INSTRUCTIONS     Dr. Dortha Schwalbe performed your thyroid biopsy today at the [facility]    [place]  [facility]:  Monday through Friday from 8 a.m. - 4 p.m.: [phone]    For T&D: [phone]  Between 4 p.m. - 8 a.m., weekends and holidays:  Call ER [phone]    PLEASE SEE WRITTEN HANDOUTS AS DISCUSSED BY YOUR NURSE:  Sunday Spillers    SIGNS AND SYMPTOMS OF A WOUND / INCISION INFECTION   Be sure to watch for the following:  Increase in redness or red streaks near or around the wound or incision.  Increase in pain that is intense or severe and cannot be relieved by the pain medication that your doctor has given you.  Increase in swelling that cannot be relieved by elevation of a body part, or by applying ice, if permitted.  Increase in drainage, or if yellow / green in color and smells bad. This could be on a dressing or a cast.  Increase in fever for longer than 24 hours, or an increase that is higher than 101 degrees Fahrenheit (normal body temperature is 98 degrees Fahrenheit). The incision may feel warm to the touch.    **CALL YOUR DOCTOR IF ONE OR MORE OF THESE SIGNS / SYMPTOMS SHOULD OCCUR.    ANESTHESIA INFORMATION   ANESTHESIA -- ADULT PATIENTS:  You have received intravenous sedation / general anesthesia, and you may feel drowsy and light-headed for several hours. You may even experience some forgetfulness of the procedure. DO NOT DRIVE A MOTOR VEHICLE or perform any activity requiring complete alertness or coordination until you feel fully awake in about 24-48 hours. Do not drink alcoholic beverages for at least 24 hours. Do not stay alone, you must have a responsible adult available to be with you. You may also experience a dry mouth or nausea for 24 hours. This is a normal side effect and will disappear as the effects of the medication wear off.    REMEMBER   If you experience any difficulty breathing, chest pain, bleeding that you feel is excessive,  persistent nausea or vomiting or for any other concerns:  Call your physician Dr. Dortha Schwalbe  at [phone] . You may also ask to have the general doctor on call paged. They are available to you 24 hours a day.      SPECIAL INSTRUCTIONS / COMMENTS   No cooking, cleaning, driving or operating machinery today, you may resume your normal activities tomorrow. You may eat a regular diet as tolerated today.  FOLLOW-UP APPOINTMENTS   Please keep your follow-up appointment with your Norville Haggard to review biopsy results.

## 2022-07-06 NOTE — Brief Op Note (Signed)
07/06/22      Radiologist: Dr. Neomia Dear    Procedure: [place] FNA THYROID    Description of Procedure Findings:      Left thyroid nodule        Estimated Blood Loss:  0 mL        Specimen Collected:  3 FNA specimens            Diagnosis: Abnormal radiological findings in skin and subcutaneous tissue; left thyroid nodule; as above; dictated report to follow      Neomia Dear, MD

## 2022-07-09 DIAGNOSIS — E041 Nontoxic single thyroid nodule: Secondary | ICD-10-CM
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
## 2022-07-21 ENCOUNTER — Emergency Department (HOSPITAL_BASED_OUTPATIENT_CLINIC_OR_DEPARTMENT_OTHER): Payer: Medicare (Managed Care)

## 2022-07-21 ENCOUNTER — Other Ambulatory Visit: Payer: Self-pay

## 2022-07-21 ENCOUNTER — Encounter (HOSPITAL_BASED_OUTPATIENT_CLINIC_OR_DEPARTMENT_OTHER): Payer: Self-pay

## 2022-07-21 ENCOUNTER — Emergency Department
Admission: EM | Admit: 2022-07-21 | Discharge: 2022-07-21 | Disposition: A | Discharge status: Home / Self Care | Payer: Medicare (Managed Care) | Attending: Emergency Medicine | Admitting: Emergency Medicine

## 2022-07-21 DIAGNOSIS — J40 Bronchitis, not specified as acute or chronic: Secondary | ICD-10-CM

## 2022-07-21 DIAGNOSIS — Z5329 Procedure and treatment not carried out because of patient's decision for other reasons: Secondary | ICD-10-CM

## 2022-07-21 DIAGNOSIS — R9431 Abnormal electrocardiogram [ECG] [EKG]: Secondary | ICD-10-CM

## 2022-07-21 DIAGNOSIS — R0789 Other chest pain: Secondary | ICD-10-CM

## 2022-07-21 DIAGNOSIS — J069 Acute upper respiratory infection, unspecified: Secondary | ICD-10-CM | POA: Insufficient documentation

## 2022-07-21 DIAGNOSIS — Z1152 Encounter for screening for COVID-19: Secondary | ICD-10-CM | POA: Insufficient documentation

## 2022-07-21 LAB — MANUAL DIFFERENTIAL
EOSINOPHIL %: 4 % (ref 0–7)
EOSINOPHIL ABSOLUTE: 0.12 10*3/uL (ref 0.00–0.80)
EOSINOPHILS MANUAL: 4
LYMPHOCYTE %: 36 % (ref 25–45)
LYMPHOCYTE ABSOLUTE: 1.08 10*3/uL — ABNORMAL LOW (ref 1.10–5.00)
LYMPHOCYTES MANUAL: 36
MONOCYTE %: 7 % (ref 0–12)
MONOCYTE ABSOLUTE: 0.21 10*3/uL (ref 0.00–1.30)
MONOCYTES MANUAL: 7
NEUTROPHIL %: 53 % (ref 40–76)
NEUTROPHIL ABSOLUTE: 1.59 10*3/uL — ABNORMAL LOW (ref 1.80–8.40)
NEUTROPHILS MANUAL: 53
PLATELET MORPHOLOGY COMMENT: NORMAL
TOTAL CELLS COUNTED [#] IN BLOOD: 100
WBC: 3 10*3/uL

## 2022-07-21 LAB — CBC WITH DIFF
HCT: 32.6 % — ABNORMAL LOW (ref 37.0–47.0)
HGB: 10.4 g/dL — ABNORMAL LOW (ref 12.5–16.0)
MCH: 27.2 pg (ref 27.0–32.0)
MCHC: 32 g/dL (ref 32.0–36.0)
MCV: 84.9 fL (ref 78.0–99.0)
MPV: 7.6 fL (ref 7.4–10.4)
PLATELETS: 233 10*3/uL (ref 140–440)
RBC: 3.84 10*6/uL — ABNORMAL LOW (ref 4.20–5.40)
RDW: 18.2 % — ABNORMAL HIGH (ref 11.6–14.8)
WBC: 3 10*3/uL — ABNORMAL LOW (ref 4.0–10.5)

## 2022-07-21 LAB — PTT (PARTIAL THROMBOPLASTIN TIME): APTT: 37.9 seconds — ABNORMAL HIGH (ref 22.0–31.7)

## 2022-07-21 LAB — COMPREHENSIVE METABOLIC PANEL, NON-FASTING
ALBUMIN/GLOBULIN RATIO: 0.8 (ref 0.8–1.4)
ALBUMIN: 2.7 g/dL — ABNORMAL LOW (ref 3.4–5.0)
ALKALINE PHOSPHATASE: 91 U/L (ref 46–116)
ALT (SGPT): 75 U/L (ref <=78)
ANION GAP: 5 mmol/L (ref 4–13)
AST (SGOT): 66 U/L — ABNORMAL HIGH (ref 15–37)
BILIRUBIN TOTAL: 0.5 mg/dL (ref 0.2–1.0)
BUN/CREA RATIO: 9
BUN: 8 mg/dL (ref 7–18)
CALCIUM, CORRECTED: 9.5 mg/dL
CALCIUM: 8.5 mg/dL (ref 8.5–10.1)
CHLORIDE: 105 mmol/L (ref 98–107)
CO2 TOTAL: 26 mmol/L (ref 21–32)
CREATININE: 0.91 mg/dL (ref 0.55–1.02)
ESTIMATED GFR: 73 mL/min/{1.73_m2} (ref >59)
GLOBULIN: 3.4
GLUCOSE: 76 mg/dL (ref 74–106)
OSMOLALITY, CALCULATED: 269 mOsm/kg — ABNORMAL LOW (ref 270–290)
POTASSIUM: 3.9 mmol/L (ref 3.5–5.1)
PROTEIN TOTAL: 6.1 g/dL — ABNORMAL LOW (ref 6.4–8.2)
SODIUM: 136 mmol/L (ref 136–145)

## 2022-07-21 LAB — ECG 12 LEAD
Atrial Rate: 85 {beats}/min
Calculated P Axis: 49 degrees
Calculated R Axis: -15 degrees
Calculated T Axis: 24 degrees
PR Interval: 162 ms
QRS Duration: 72 ms
QT Interval: 342 ms
QTC Calculation: 406 ms
Ventricular rate: 85 {beats}/min

## 2022-07-21 LAB — D-DIMER: D-DIMER: 323 ng/mL DDU — ABNORMAL HIGH (ref <=232)

## 2022-07-21 LAB — TROPONIN-I: TROPONIN I: 3 ng/L (ref <15)

## 2022-07-21 LAB — PT/INR
INR: 1.1 (ref 0.88–1.10)
PROTHROMBIN TIME: 12.9 seconds — ABNORMAL HIGH (ref 9.8–12.7)

## 2022-07-21 LAB — COVID-19, FLU A/B, RSV RAPID BY PCR
INFLUENZA VIRUS TYPE A: NOT DETECTED
INFLUENZA VIRUS TYPE B: NOT DETECTED
RESPIRATORY SYNCTIAL VIRUS (RSV): NOT DETECTED
SARS-CoV-2: NOT DETECTED

## 2022-07-21 LAB — MAGNESIUM: MAGNESIUM: 1.6 mg/dL — ABNORMAL LOW (ref 1.8–2.4)

## 2022-07-21 MED ORDER — NITROGLYCERIN 0.4 MG SUBLINGUAL TABLET
0.4000 mg | SUBLINGUAL_TABLET | SUBLINGUAL | Status: AC
Start: 2022-07-21 — End: 2022-07-21
  Administered 2022-07-21: 0.4 mg via SUBLINGUAL

## 2022-07-21 MED ORDER — ACETAMINOPHEN 325 MG TABLET
650.0000 mg | ORAL_TABLET | ORAL | Status: AC
Start: 2022-07-21 — End: 2022-07-21
  Administered 2022-07-21: 650 mg via ORAL

## 2022-07-21 MED ORDER — ASPIRIN 81 MG CHEWABLE TABLET
324.0000 mg | CHEWABLE_TABLET | ORAL | Status: AC
Start: 2022-07-21 — End: 2022-07-21
  Administered 2022-07-21: 324 mg via ORAL

## 2022-07-21 MED ORDER — ACETAMINOPHEN 325 MG TABLET
ORAL_TABLET | ORAL | Status: AC
Start: 2022-07-21 — End: 2022-07-21
  Filled 2022-07-21: qty 2

## 2022-07-21 MED ORDER — TRAMADOL 50 MG TABLET
ORAL_TABLET | ORAL | Status: AC
Start: 2022-07-21 — End: 2022-07-21
  Filled 2022-07-21: qty 1

## 2022-07-21 MED ORDER — NITROGLYCERIN 2 % TRANSDERMAL OINTMENT - PACKET
1.0000 [in_us] | TOPICAL_OINTMENT | TRANSDERMAL | Status: AC
Start: 2022-07-21 — End: 2022-07-21
  Administered 2022-07-21: 1 [in_us] via TOPICAL

## 2022-07-21 MED ORDER — MAGNESIUM SULFATE 500 MG/ML (50 %) INJECTION SOLUTION
2.0000 g | Freq: Once | INTRAMUSCULAR | Status: AC
Start: 2022-07-21 — End: 2022-07-21
  Administered 2022-07-21: 2 g via INTRAMUSCULAR

## 2022-07-21 MED ORDER — MAGNESIUM SULFATE 500 MG/ML (50 %) INJECTION SOLUTION
INTRAMUSCULAR | Status: AC
Start: 2022-07-21 — End: 2022-07-21
  Filled 2022-07-21: qty 4

## 2022-07-21 MED ORDER — ONDANSETRON HCL (PF) 4 MG/2 ML INJECTION SOLUTION
4.0000 mg | INTRAMUSCULAR | Status: DC
Start: 2022-07-21 — End: 2022-07-21

## 2022-07-21 MED ORDER — NITROGLYCERIN 0.4 MG SUBLINGUAL TABLET
SUBLINGUAL_TABLET | SUBLINGUAL | Status: AC
Start: 2022-07-21 — End: 2022-07-21
  Filled 2022-07-21: qty 1

## 2022-07-21 MED ORDER — TRAMADOL 50 MG TABLET
50.0000 mg | ORAL_TABLET | ORAL | Status: AC
Start: 2022-07-21 — End: 2022-07-21
  Administered 2022-07-21: 50 mg via ORAL

## 2022-07-21 MED ORDER — ONDANSETRON 4 MG DISINTEGRATING TABLET
ORAL_TABLET | ORAL | Status: AC
Start: 2022-07-21 — End: 2022-07-21
  Filled 2022-07-21: qty 1

## 2022-07-21 MED ORDER — SODIUM CHLORIDE 0.9 % IV BOLUS
500.0000 mL | INJECTION | Status: DC
Start: 2022-07-21 — End: 2022-07-21

## 2022-07-21 MED ORDER — NITROGLYCERIN 2 % TRANSDERMAL OINTMENT - PACKET
TOPICAL_OINTMENT | TRANSDERMAL | Status: AC
Start: 2022-07-21 — End: 2022-07-21
  Filled 2022-07-21: qty 1

## 2022-07-21 MED ORDER — ONDANSETRON 4 MG DISINTEGRATING TABLET
4.0000 mg | ORAL_TABLET | ORAL | Status: AC
Start: 2022-07-21 — End: 2022-07-21
  Administered 2022-07-21: 4 mg via ORAL

## 2022-07-21 MED ORDER — ASPIRIN 81 MG CHEWABLE TABLET
CHEWABLE_TABLET | ORAL | Status: AC
Start: 2022-07-21 — End: 2022-07-21
  Filled 2022-07-21: qty 4

## 2022-07-21 NOTE — ED Triage Notes (Addendum)
Aching all over, head ache, N/V/D chest pain, and cough . Recent dx Thyroid CA

## 2022-07-21 NOTE — ED Nurses Note (Signed)
Pt awake alert . Skin warm and dry. Respirations even and unlabored. 02 sat 98% room air. Pt states, "I have a head ache, I hurt all over, my chest hurts, I have been vomiting and having diarrhea. I just feel weak. I was just DX with thyroid cancer. I have gastroparesis. I have head aches often. " Abdomen soft generalized tenderness. Normal bowl sounds throughout. Pt c/o's chest pain at left chest.  Cardiac monitor shows SR without ectopy.  Head ache, aching all over, and chest pain 10/10 Attempting to obtain IV access.

## 2022-07-21 NOTE — ED Nurses Note (Signed)
Pt chest pain free. States, "I just need something for my head ache." Dr. Ermalinda Memos informed.

## 2022-07-21 NOTE — ED Provider Notes (Signed)
[facility], [facility] Emergency Department  ED Primary Provider Note  History of Present Illness   Chief Complaint   Patient presents with    Flu Like Symptoms    Chest Pain      Martha White is a 56 y.o. female who had concerns including Flu Like Symptoms and Chest Pain .  Arrival: The patient arrived by Car complaining of having left-sided chest pain this morning waking her sleep.  She denies any radiation pain to her throat, left arm, or back.  Patient denies any increase in pain with deep inspiration or movement.  Patient denies any pain with palpation.  She states she is short breath with the pain.  Patient has had chest pain in the past but did not take any aspirin or nitro this morning.  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  She has gastroparesis as well as gastroesophageal reflux disease.  She states having hypertension, diabetes, and family history of coronary artery disease.  She denies any high cholesterol or smoking history.  Patient has had aches and pains all over as well.  Patient has been coughing nonproductive over the past 3-4 days.  She states having nasal congestion as well.  She denies any sore throat or earache.  Patient is also complaining of having headache and nausea vomiting diarrhea over the past several days.  Patient has a history chronic obstructive pulmonary disease and thyroid disease as well.  Patient states having TIA in the past.  She also has had CHF in the past.  She denies any orthopnea or PND this morning.  She denied any swelling of her lower legs.    HPI  Review of Systems   Review of Systems   Constitutional:  Positive for activity change, appetite change and fatigue. Negative for chills and fever.   HENT:  Positive for congestion, postnasal drip and rhinorrhea. Negative for ear pain and sore throat.    Eyes:  Negative for pain and visual disturbance.   Respiratory:  Positive for cough and shortness of breath.     Cardiovascular:  Positive for chest pain. Negative for palpitations.   Gastrointestinal:  Negative for abdominal pain and vomiting.   Genitourinary:  Negative for dysuria and hematuria.   Musculoskeletal:  Positive for arthralgias and myalgias. Negative for back pain.   Skin:  Negative for color change and rash.   Neurological:  Negative for seizures and syncope.   All other systems reviewed and are negative.     Historical Data   History Reviewed This Encounter: Medical History  Surgical History  Family History  Social History    Physical Exam   ED Triage Vitals [07/21/22 0718]   BP (Non-Invasive) (!) 147/97   Heart Rate 91   Respiratory Rate 18   Temperature 36.4 C (97.5 F)   SpO2 100 %   Weight    Height      Physical Exam  Vitals and nursing note reviewed.   Constitutional:       General: She is not in acute distress.     Appearance: She is well-developed. She is obese.   HENT:      Head: Normocephalic and atraumatic.      Right Ear: External ear normal.      Left Ear: External ear normal.      Nose: Congestion and rhinorrhea present.      Mouth/Throat:      Mouth: Mucous membranes are dry.  Eyes:      Extraocular Movements: Extraocular movements intact.      Conjunctiva/sclera: Conjunctivae normal.      Pupils: Pupils are equal, round, and reactive to light.   Cardiovascular:      Rate and Rhythm: Normal rate and regular rhythm.      Pulses: Normal pulses.      Heart sounds: Normal heart sounds. No murmur heard.  Pulmonary:      Effort: Pulmonary effort is normal. No respiratory distress.      Breath sounds: Normal breath sounds.   Abdominal:      General: Bowel sounds are normal.      Palpations: Abdomen is soft.      Tenderness: There is no abdominal tenderness.   Musculoskeletal:         General: No swelling. Normal range of motion.      Cervical back: Normal range of motion and neck supple.   Skin:     General: Skin is warm and dry.      Capillary Refill: Capillary refill takes less than 2 seconds.    Neurological:      General: No focal deficit present.      Mental Status: She is alert and oriented to person, place, and time.   Psychiatric:         Mood and Affect: Mood normal.         Behavior: Behavior normal.         Thought Content: Thought content normal.       Patient Data     Labs Ordered/Reviewed   COMPREHENSIVE METABOLIC PANEL, NON-FASTING - Abnormal; Notable for the following components:       Result Value    ALBUMIN 2.7 (*)     AST (SGOT) 66 (*)     PROTEIN TOTAL 6.1 (*)     OSMOLALITY, CALCULATED 269 (*)     All other components within normal limits    Narrative:     Estimated Glomerular Filtration Rate (eGFR) is calculated using the CKD-EPI (2021) equation, intended for patients 18 years of age and older. If gender is not documented or "unknown", there will be no eGFR calculation.   D-DIMER - Abnormal; Notable for the following components:    D-DIMER 323 (*)     All other components within normal limits   MAGNESIUM - Abnormal; Notable for the following components:    MAGNESIUM 1.6 (*)     All other components within normal limits   PT/INR - Abnormal; Notable for the following components:    PROTHROMBIN TIME 12.9 (*)     All other components within normal limits    Narrative:     INR OF 2.0-3.0  RECOMMENDED FOR: PROPHYLAXIS/TREATMENT OF VENEOUS THROMBOSIS, PULMONARY EMBOLISM, PREVENTION OF SYSTEMIC EMBOLISM FROM ATRIAL FIBRILATION, MYOCARDIAL INFARCTION.    INR OF 2.5-3.5  RECOMMENDED FOR MECHANICAL PROSTHETIC HEART VALVES, RECURRENT SYSTEMIC EMBOLISM, RECURRENT MYOCARDIAL INFARCTION.     PTT (PARTIAL THROMBOPLASTIN TIME) - Abnormal; Notable for the following components:    APTT 37.9 (*)     All other components within normal limits   CBC WITH DIFF - Abnormal; Notable for the following components:    WBC 3.0 (*)     RBC 3.84 (*)     HGB 10.4 (*)     HCT 32.6 (*)     RDW 18.2 (*)     All other components within normal limits    Narrative:     Repeated x2  MANUAL DIFFERENTIAL - Abnormal; Notable for  the following components:    NEUTROPHIL ABSOLUTE 1.59 (*)     LYMPHOCYTE ABSOLUTE 1.08 (*)     All other components within normal limits   TROPONIN-I - Normal    Narrative:     Values received on females ranging between 12-15 ng/L MUST include the next serial troponin to review changes in the delta differences as the reference range for the Access II chemistry analyzer is lower than the established reference range.     COVID-19, FLU A/B, RSV RAPID BY PCR - Normal    Narrative:     Results are for the simultaneous qualitative identification of SARS-CoV-2 (formerly 2019-nCoV), Influenza A, Influenza B, and RSV RNA. These etiologic agents are generally detectable in nasopharyngeal and nasal swabs during the ACUTE PHASE of infection. Hence, this test is intended to be performed on respiratory specimens collected from individuals with signs and symptoms of upper respiratory tract infection who meet Centers for Disease Control and Prevention (CDC) clinical and/or epidemiological criteria for Coronavirus Disease 2019 (COVID-19) testing. CDC COVID-19 criteria for testing on human specimens is available at [facility] webpage information for Healthcare Professionals: Coronavirus Disease 2019 (COVID-19) ([email or web address]).     False-negative results may occur if the virus has genomic mutations, insertions, deletions, or rearrangements or if performed very early in the course of illness. Otherwise, negative results indicate virus specific RNA targets are not detected, however negative results do not preclude SARS-CoV-2 infection/COVID-19, Influenza, or Respiratory syncytial virus infection. Results should not be used as the sole basis for patient management decisions. Negative results must be combined with clinical observations, patient history, and epidemiological information. If upper respiratory tract infection is still suspected based on exposure history together with other clinical  findings, re-testing should be considered.    Disclaimer:   This assay has been authorized by FDA under an Emergency Use Authorization for use in laboratories certified under the Clinical Laboratory Improvement Amendments of 1988 (CLIA), 42 U.S.C. [phone], to perform high complexity tests. The impacts of vaccines, antiviral therapeutics, antibiotics, chemotherapeutic or immunosuppressant drugs have not been evaluated.     Test methodology:   Cepheid Xpert Xpress SARS-CoV-2/Flu/RSV Assay real-time polymerase chain reaction (RT-PCR) test on the GeneXpert Dx and Xpert Xpress systems.   CBC/DIFF    Narrative:     The following orders were created for panel order CBC/DIFF.  Procedure                               Abnormality         Status                     ---------                               -----------         ------                     CBC WITH RUEA[[id number]]                Abnormal            Final result               MANUAL DIFFERENTIAL[[id number]]          Abnormal  Final result                 Please view results for these tests on the individual orders.   TROPONIN-I   TROPONIN-I   URINALYSIS WITH REFLEX MICROSCOPIC AND CULTURE IF POSITIVE    Narrative:     The following orders were created for panel order URINALYSIS WITH REFLEX MICROSCOPIC AND CULTURE IF POSITIVE.  Procedure                               Abnormality         Status                     ---------                               -----------         ------                     URINALYSIS, MACRO/MICRO[[id number]]                                                       Please view results for these tests on the individual orders.   URINALYSIS, MACRO/MICRO     XR AP MOBILE CHEST   Final Result by Edi, Radresults In (05/04 2952)   NO ACUTE FINDINGS.            Radiologist location ID: [id number]           Medical Decision Making        Medical Decision Making  Patient is 57 year old black female complaining generalized weakness with fatigue  along with nausea vomiting and diarrhea over the past 3-4 days.  She is complaining of left-sided chest pain nonradiating associated with shortness breath.  She denies having any diaphoresis.  She states she has had chest pain in the past as well as CHF.  She also has gastroparesis after having bypass surgery on her stomach.  Patient does have gastroesophageal reflux disease.  She states having hypertension as well as diabetes.  She denies any smoking history or high cholesterol.  Patient does have family history of coronary artery disease.  Patient states she has been coughing nonproductive over the last several days with nasal congestion and postnasal drip.  She is also complaining of myalgias and arthralgias.  Patient has not vomited this morning.  She is complaining of a headache presently in the front of her head.  Patient will have an IV placed with fluids.  Patient will have labs and chest x-ray.  Patient will be given nitro as well as inch of paste.  She will be given aspirin as well.  Patient will have serial troponins x3.  Patient will also have a for Plex done.  Patient will be treated for results and possibly discharged home.  She will follow up with her family physician in the next 3 days.      Patient came to the nurse and stated that she was going to sign out against medical advice.  She stated she was tired of waiting and did feel somewhat better.  Patient would take the magnesium shot.  I tried to convince  the patient to stay and have a CTA of her chest but she refuses.  She also stated that her chest pain was completely gone and that she did not want to wait any further.  Patient was informed that she could actually get worse or even die if she left before total treatment was done.  She stated she understood that but she was still wanting to leave.    Amount and/or Complexity of Data Reviewed  Labs: ordered.  Radiology: ordered.  ECG/medicine tests: ordered.     Details: Normal sinus rhythm 85, PR  interval 162 MS, QT interval 342 MS, LVH, flat T-waves 3, AVF, poor R-wave progression through the precordium.    Risk  OTC drugs.  Prescription drug management.             Medications Administered in the ED   aspirin chewable tablet 324 mg (324 mg Oral Given 07/21/22 0758)   nitroGLYCERIN (NITROSTAT) sublingual tablet (0.4 mg Sublingual Given 07/21/22 0800)   nitroGLYCERIN (NITRO-BID) 2 % topical ointment (1 Inch Apply Topically Given 07/21/22 0810)   acetaminophen (TYLENOL) tablet (650 mg Oral Given 07/21/22 0855)   traMADol (ULTRAM) tablet (50 mg Oral Given 07/21/22 0855)   ondansetron (ZOFRAN ODT) rapid dissolve tablet (4 mg Oral Given 07/21/22 0857)   magnesium sulfate 500 mg/mL injection (2 g IntraMUSCULAR Given 07/21/22 1029)     Clinical Impression   Atypical chest pain (Primary)   Hypomagnesemia   Bronchitis   Upper respiratory tract infection, unspecified type       Disposition: AMA               Clinical Impression   Atypical chest pain (Primary)   Hypomagnesemia   Bronchitis   Upper respiratory tract infection, unspecified type       Current Discharge Medication List

## 2022-07-21 NOTE — ED Nurses Note (Addendum)
Pt awake alert and oriented. Skin warm and dry. Respirations even and unlabored. Pt states, "I just want to go home. My chest pain is gone . My head still hurts . I just want to go home and rest." Head ache 7/10 Pt informed she should remain and complete testing . Pt refuses . Dr. Ermalinda Memos informed. Pt signed AMA form. Left ER ambulatory with husband.

## 2022-07-21 NOTE — ED Nurses Note (Addendum)
Attempted IV access x 4 RN six attempts. Unable to obtain. Dr. Ermalinda Memos informed.Blood pressure 138/100 NTG 0.4 mg administered SL .

## 2022-07-23 LAB — CYTOPATHOLOGY, FINE NEEDLE ASPIRATE

## 2022-07-26 IMAGING — MR MRI CERVICAL SPINE WITHOUT CONTRAST
4 of 5 series · 24 of 48 positions shown · IV contrast (gadolinium)
Comparison: None available.

﻿EXAM:  [id number]   MRI CERVICAL SPINE WITHOUT CONTRAST
INDICATION: Cervical radiculopathy. Neck pain radiating to right arm with numbness.  No history of C-spine surgery.
TECHNIQUE: Multiplanar, multisequential MRI of the C-spine was performed without gadolinium contrast.

[Series 5: T2 · sagittal · 3.0mm · 0.75mm/px · 8 of 13 slices shown (1 of 2)]
[im 1/13]
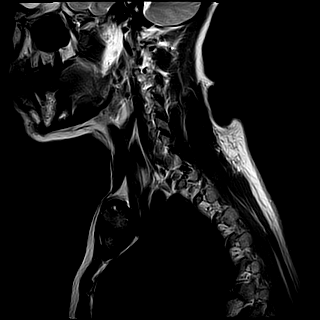
[im 2/13]
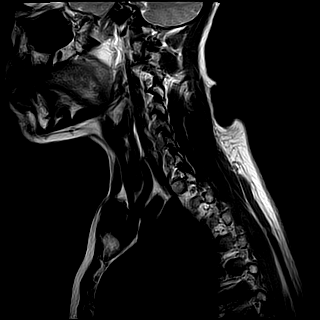
[im 4/13]
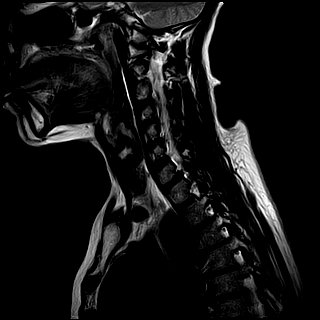
[im 6/13]
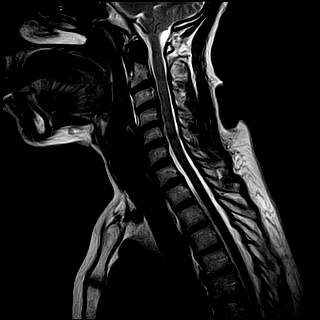
[im 7/13]
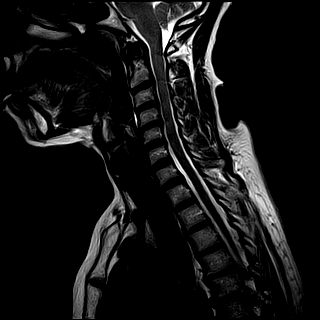
[im 9/13]
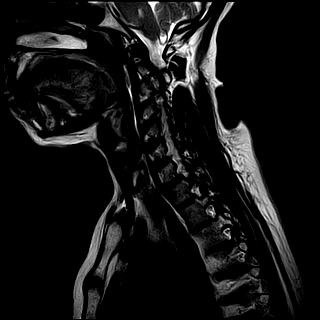
[im 11/13]
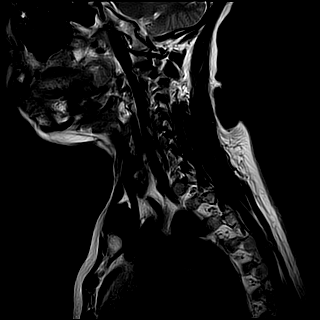
[im 13/13]
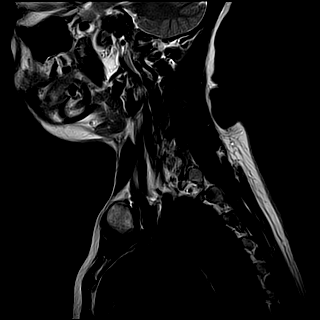

[Series 6: T1 · sagittal · 3.0mm · 0.47mm/px · 4 of 13 slices shown]
[im 1/13]
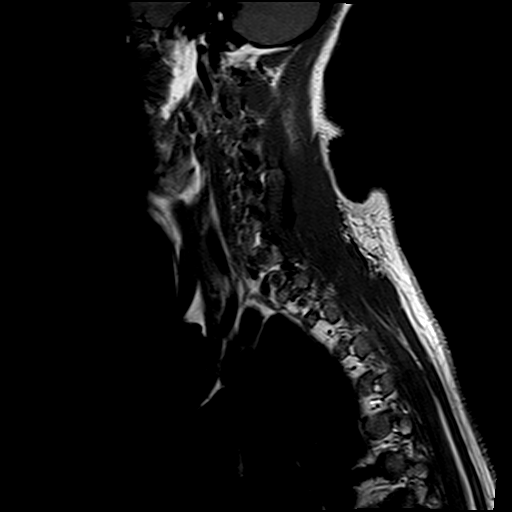
[im 2/13]
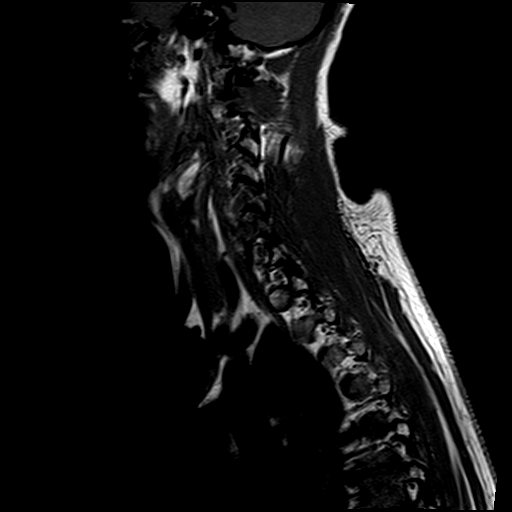
[im 7/13]
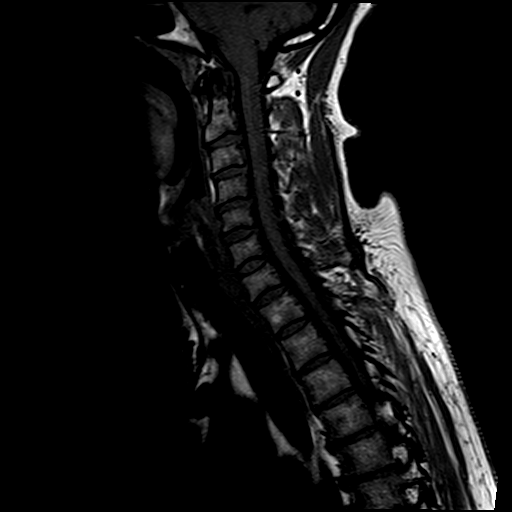
[im 11/13]
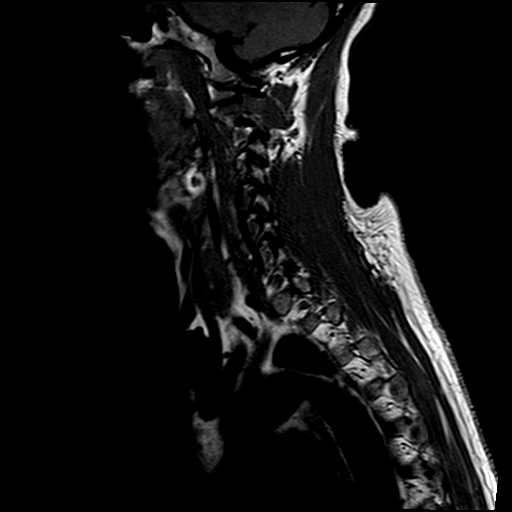

[Series 7: STIR · sagittal · 3.0mm · 0.47mm/px · 3 of 13 slices shown]
[im 2/13]
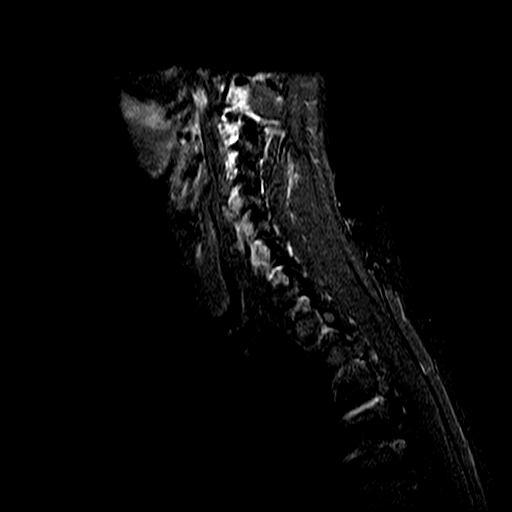
[im 7/13]
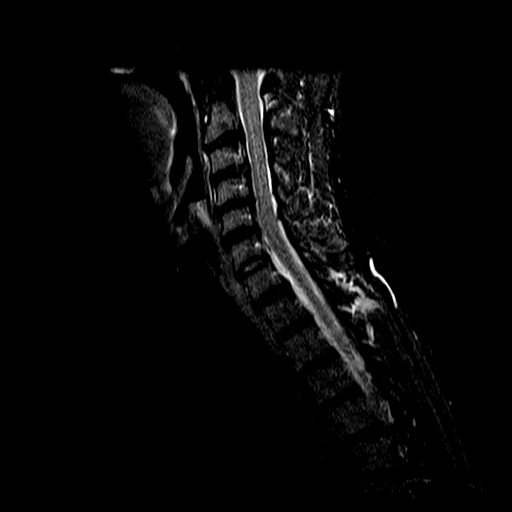
[im 11/13]
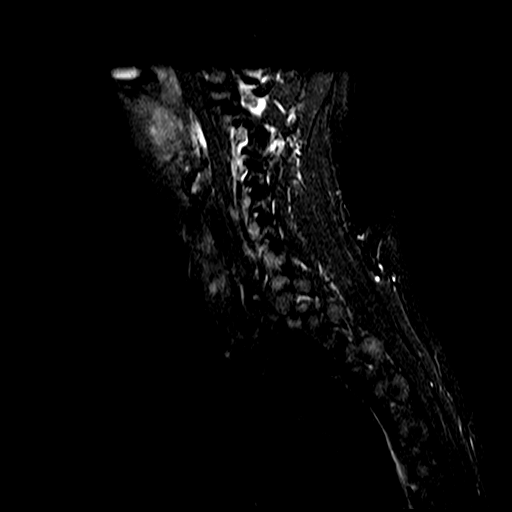

[Series 9: T2 · axial · 3.0mm · 0.39mm/px · z∈[-66,+18]mm · 9 of 18 slices shown (2 of 2)]
[im 1/18]
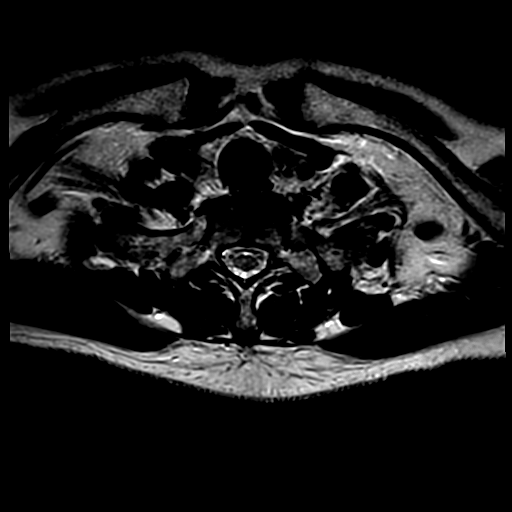
[im 4/18]
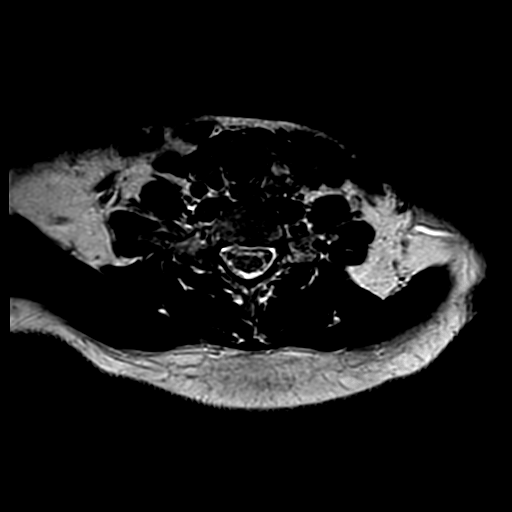
[im 5/18]
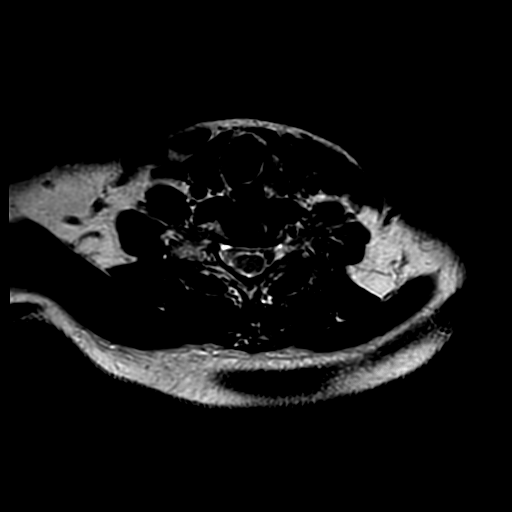
[im 8/18]
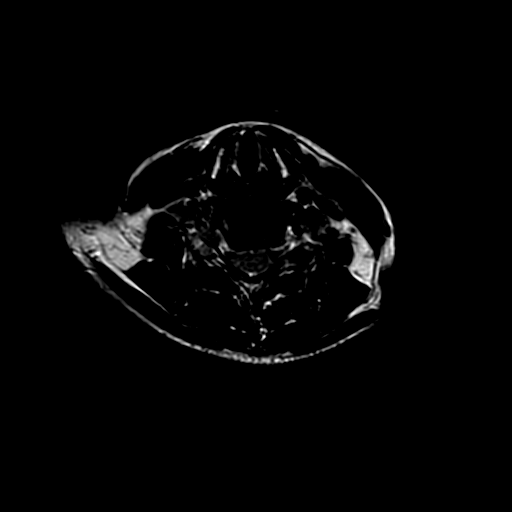
[im 10/18]
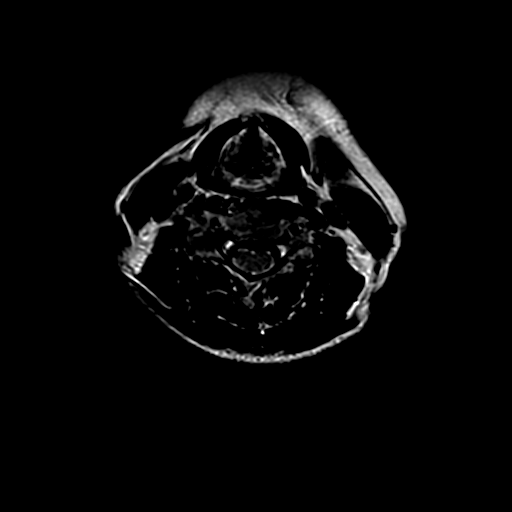
[im 13/18]
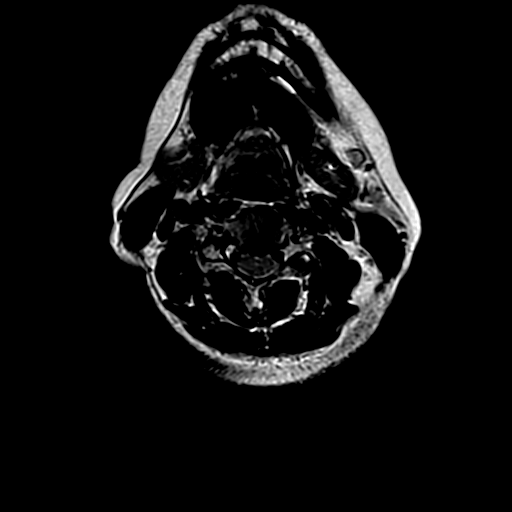
[im 14/18]
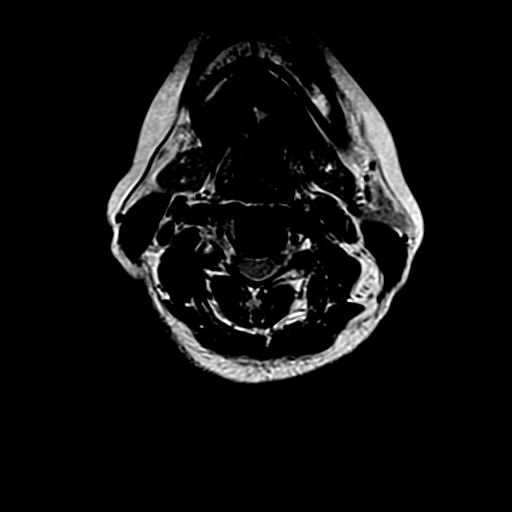
[im 16/18]
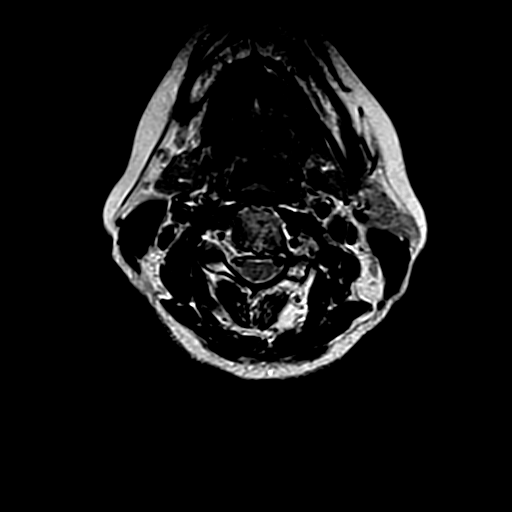
[im 18/18]
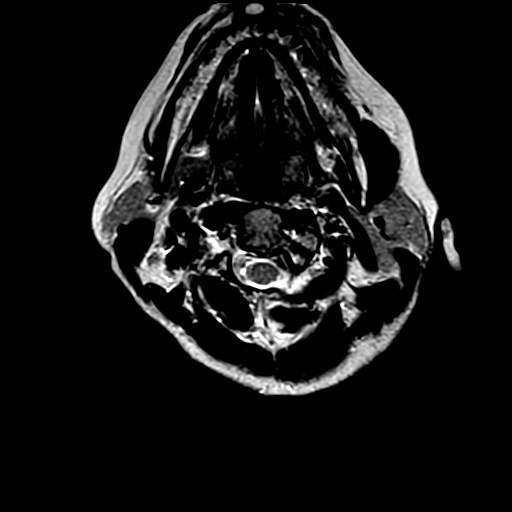

[24 of 48 positions shown; findings below may reference images not displayed]

FINDINGS: No focal bone changes of cervical vertebrae are seen. 

Foramen magnum structures are normal in the sagittal projection. 

At C2-3 level, no focal disc lesions are seen.

At C3-4 level, degenerative changes with osteophyte complex on the right side are causing mild right foraminal narrowing.

At C4-5 level, small central disc protrusion mildly impinges on thecal sac in the midline.

At C5-6 level, no focal disc lesions are seen.

At C6-7 level, no focal disc lesions are seen.  

C7-T1 disc is normal. 

Cervical spinal cord shows no focal lesions.  Paravertebral soft tissues are unremarkable.
IMPRESSION: 1. No focal bone changes of cervical vertebrae.

2. Degenerative changes of mild degree at C3-4, C4-5 levels as described above in detail.

3. Cervical spinal cord shows no focal lesions.

## 2022-10-01 ENCOUNTER — Ambulatory Visit (RURAL_HEALTH_CENTER): Payer: Self-pay | Admitting: Family Medicine

## 2022-10-04 ENCOUNTER — Encounter (HOSPITAL_BASED_OUTPATIENT_CLINIC_OR_DEPARTMENT_OTHER): Payer: Self-pay

## 2022-10-04 ENCOUNTER — Emergency Department
Admission: EM | Admit: 2022-10-04 | Discharge: 2022-10-04 | Disposition: A | Discharge status: Home / Self Care | Payer: Medicare (Managed Care) | Attending: Emergency Medicine | Admitting: Emergency Medicine

## 2022-10-04 ENCOUNTER — Other Ambulatory Visit: Payer: Self-pay

## 2022-10-04 DIAGNOSIS — K0510 Chronic gingivitis, plaque induced: Secondary | ICD-10-CM | POA: Insufficient documentation

## 2022-10-04 DIAGNOSIS — K0889 Other specified disorders of teeth and supporting structures: Secondary | ICD-10-CM | POA: Insufficient documentation

## 2022-10-04 DIAGNOSIS — T7840XA Allergy, unspecified, initial encounter: Secondary | ICD-10-CM

## 2022-10-04 DIAGNOSIS — L500 Allergic urticaria: Secondary | ICD-10-CM | POA: Insufficient documentation

## 2022-10-04 DIAGNOSIS — L509 Urticaria, unspecified: Secondary | ICD-10-CM

## 2022-10-04 DIAGNOSIS — T360X5A Adverse effect of penicillins, initial encounter: Secondary | ICD-10-CM | POA: Insufficient documentation

## 2022-10-04 MED ORDER — DIPHENHYDRAMINE 50 MG CAPSULE
50.0000 mg | ORAL_CAPSULE | Freq: Four times a day (QID) | ORAL | 0 refills | Status: AC | PRN
Start: 2022-10-04 — End: 2022-10-11

## 2022-10-04 MED ORDER — TRAMADOL 50 MG TABLET
ORAL_TABLET | ORAL | Status: AC
Start: 2022-10-04 — End: 2022-10-04
  Filled 2022-10-04: qty 2

## 2022-10-04 MED ORDER — FAMOTIDINE 20 MG TABLET
ORAL_TABLET | ORAL | Status: AC
Start: 2022-10-04 — End: 2022-10-04
  Filled 2022-10-04: qty 1

## 2022-10-04 MED ORDER — FAMOTIDINE 20 MG TABLET
20.0000 mg | ORAL_TABLET | ORAL | Status: AC
Start: 2022-10-04 — End: 2022-10-04
  Administered 2022-10-04: 20 mg via ORAL

## 2022-10-04 MED ORDER — TRAMADOL 50 MG TABLET
100.0000 mg | ORAL_TABLET | ORAL | Status: AC
Start: 2022-10-04 — End: 2022-10-04
  Administered 2022-10-04: 100 mg via ORAL

## 2022-10-04 MED ORDER — METHYLPREDNISOLONE ACETATE 40 MG/ML SUSPENSION FOR INJECTION
INTRAMUSCULAR | Status: AC
Start: 2022-10-04 — End: 2022-10-04
  Filled 2022-10-04: qty 2

## 2022-10-04 MED ORDER — TRAMADOL 50 MG TABLET
1.0000 | ORAL_TABLET | Freq: Four times a day (QID) | ORAL | 0 refills | Status: DC | PRN
Start: 2022-10-04 — End: 2024-01-03

## 2022-10-04 MED ORDER — DIPHENHYDRAMINE 25 MG CAPSULE
ORAL_CAPSULE | ORAL | Status: AC
Start: 2022-10-04 — End: 2022-10-04
  Filled 2022-10-04: qty 2

## 2022-10-04 MED ORDER — DIPHENHYDRAMINE 25 MG CAPSULE
50.0000 mg | ORAL_CAPSULE | ORAL | Status: AC
Start: 2022-10-04 — End: 2022-10-04
  Administered 2022-10-04: 50 mg via ORAL

## 2022-10-04 MED ORDER — METHYLPREDNISOLONE ACETATE 80 MG/ML SUSPENSION FOR INJECTION
INTRAMUSCULAR | Status: AC
Start: 2022-10-04 — End: 2022-10-04
  Filled 2022-10-04: qty 1

## 2022-10-04 MED ORDER — FAMOTIDINE 20 MG TABLET
20.0000 mg | ORAL_TABLET | Freq: Two times a day (BID) | ORAL | 0 refills | Status: AC
Start: 2022-10-04 — End: 2022-10-11

## 2022-10-04 MED ORDER — METHYLPREDNISOLONE ACETATE 80 MG/ML SUSPENSION FOR INJECTION
80.0000 mg | Freq: Once | INTRAMUSCULAR | Status: AC
Start: 2022-10-04 — End: 2022-10-04
  Administered 2022-10-04: 80 mg via INTRAMUSCULAR

## 2022-10-04 NOTE — ED Triage Notes (Signed)
Im allergic to sulfa and the dentist prescribed penicillin and Im broke out all over and itching. I just need a shot. I have taken about 4-5 pills.

## 2022-10-04 NOTE — ED Provider Notes (Signed)
[facility], [facility] Emergency Department  ED Primary Provider Note  History of Present Illness   Chief Complaint   Patient presents with    Allergic reaction     Martha White is a 57 y.o. female who had concerns including Allergic reaction.  Arrival: The patient arrived by Car complaining of having 20 teeth pulled 2 weeks ago at the dentist's office.  They then immediately stuck dentures over the wounds.  Patient stated she was taking penicillin at the time and developed urticaria and itching rash.  She denies any shortness of breath.  No swelling of her tongue or throat.  No problems with swallowing.  She denies any chest pain or shortness breath.  She then went back to [facility] and they switched her antibiotic to clindamycin.  Patient has been taking Motrin and the antibiotic since then.  They never dressed the allergic reaction that is been going on for over a week.    HPI  Review of Systems   Review of Systems   Constitutional:  Positive for activity change and appetite change. Negative for chills and fever.   HENT:  Positive for dental problem. Negative for ear pain and sore throat.    Eyes:  Negative for pain and visual disturbance.   Respiratory:  Negative for cough and shortness of breath.    Cardiovascular:  Negative for chest pain and palpitations.   Gastrointestinal:  Negative for abdominal pain and vomiting.   Genitourinary:  Negative for dysuria and hematuria.   Musculoskeletal:  Negative for arthralgias and back pain.   Skin:  Positive for rash. Negative for color change.   Neurological:  Negative for seizures and syncope.   All other systems reviewed and are negative.     Historical Data   History Reviewed This Encounter:     Physical Exam   ED Triage Vitals [10/04/22 1005]   BP (Non-Invasive) 131/87   Heart Rate 91   Respiratory Rate 18   Temperature 36.4 C (97.6 F)   SpO2 97 %   Weight    Height      Physical Exam  Vitals and nursing note reviewed.    Constitutional:       General: She is not in acute distress.     Appearance: She is well-developed and normal weight.   HENT:      Head: Normocephalic and atraumatic.      Right Ear: External ear normal.      Left Ear: External ear normal.      Nose: Nose normal.      Mouth/Throat:      Mouth: Mucous membranes are dry.      Pharynx: Posterior oropharyngeal erythema present.      Comments: Positive gingivitis with open wounds where the teeth were extracted.  Eyes:      Extraocular Movements: Extraocular movements intact.      Conjunctiva/sclera: Conjunctivae normal.      Pupils: Pupils are equal, round, and reactive to light.   Cardiovascular:      Rate and Rhythm: Normal rate and regular rhythm.      Pulses: Normal pulses.      Heart sounds: Normal heart sounds. No murmur heard.  Pulmonary:      Effort: Pulmonary effort is normal. No respiratory distress.      Breath sounds: Normal breath sounds.   Abdominal:      General: Bowel sounds are normal.      Palpations: Abdomen is soft.  Tenderness: There is no abdominal tenderness.   Musculoskeletal:         General: No swelling. Normal range of motion.      Cervical back: Normal range of motion and neck supple.   Skin:     General: Skin is warm and dry.      Capillary Refill: Capillary refill takes less than 2 seconds.      Findings: Erythema and rash present.      Comments: Positive urticaria generalized over the body.  No angioedema.  No swelling of the mouth or tongue.   Neurological:      General: No focal deficit present.      Mental Status: She is alert and oriented to person, place, and time.   Psychiatric:         Mood and Affect: Mood normal.         Behavior: Behavior normal.         Thought Content: Thought content normal.       Patient Data   Labs Ordered/Reviewed - No data to display  No orders to display     Medical Decision Making        Medical Decision Making  Patient is 57 year old black female complaining of having all her teeth pills 2 weeks  ago and given penicillin.  She then developed a urticaria and itching generalized rash.  Patient is complaining of mouth pain with swelling where her teeth were pulled.  Patient was instructed to use her dentures immediately over the wounds.  Patient then went back and was switched from penicillin to clindamycin.  Patient is still having itching rash with hives.  She denies any shortness of breath or chest pain.  Patient denies any fever chills.  No difficulty swallowing.  No change in voice.  Patient will have steroid injection with Pepcid and Benadryl for the next 5 days.  Patient will continue the clindamycin and was given Ultram for pain.  Patient will follow up with dentist in the next 3 days.    Risk  OTC drugs.  Prescription drug management.             Medications Administered in the ED   methylPREDNISolone acetate (DEPO-medrol) 80 mg/mL injection (has no administration in time range)   famotidine (PEPCID) tablet (has no administration in time range)   diphenhydrAMINE (BENADRYL) capsule (has no administration in time range)   traMADol (ULTRAM) tablet (has no administration in time range)   famotidine (PEPCID) tablet (has no administration in time range)     Clinical Impression   Pain, dental (Primary)   Gingivitis   Acute allergic reaction   Urticaria       Disposition: Discharged               Clinical Impression   Pain, dental (Primary)   Gingivitis   Acute allergic reaction   Urticaria       Current Discharge Medication List        START taking these medications    Details   diphenhydrAMINE (BENADRYL) 50 mg Oral Capsule Take 1 Capsule (50 mg total) by mouth Every 6 hours as needed for up to 7 days  Qty: 28 Capsule, Refills: 0

## 2022-10-04 NOTE — ED Nurses Note (Signed)
Pt DC home ambulatory with husband. Prescriptions x 3 e-scribed. Verbal and written instructions given. Pt and husband voice understanding.

## 2022-10-04 NOTE — ED Nurses Note (Signed)
Pt c/o's itching all over. Scabbed areas noted scattered. Respirations even and unlabored. Denies short of breath. States, "I'm allergic to PCN and didn't realize that my antibiotic was PCN until I started itching. "

## 2022-10-07 ENCOUNTER — Encounter (HOSPITAL_BASED_OUTPATIENT_CLINIC_OR_DEPARTMENT_OTHER): Payer: Self-pay

## 2022-10-07 ENCOUNTER — Other Ambulatory Visit: Payer: Self-pay

## 2022-10-07 ENCOUNTER — Emergency Department
Admission: EM | Admit: 2022-10-07 | Discharge: 2022-10-07 | Disposition: A | Discharge status: Home / Self Care | Payer: Medicare (Managed Care) | Attending: NURSE PRACTITIONER | Admitting: NURSE PRACTITIONER

## 2022-10-07 DIAGNOSIS — L299 Pruritus, unspecified: Secondary | ICD-10-CM

## 2022-10-07 DIAGNOSIS — L509 Urticaria, unspecified: Secondary | ICD-10-CM

## 2022-10-07 DIAGNOSIS — L500 Allergic urticaria: Secondary | ICD-10-CM | POA: Insufficient documentation

## 2022-10-07 HISTORY — DX: Type 2 diabetes mellitus without complications: E11.9

## 2022-10-07 LAB — CBC WITH DIFF
BASOPHIL #: 0.02 10*3/uL (ref 0.00–0.30)
BASOPHIL %: 1 % (ref 0–3)
EOSINOPHIL #: 0.13 10*3/uL (ref 0.00–0.80)
EOSINOPHIL %: 3 % (ref 0–7)
HCT: 36.4 % — ABNORMAL LOW (ref 37.0–47.0)
HGB: 11.7 g/dL — ABNORMAL LOW (ref 12.5–16.0)
LYMPHOCYTE #: 1.21 10*3/uL (ref 1.10–5.00)
LYMPHOCYTE %: 32 % (ref 25–45)
MCH: 27.7 pg (ref 27.0–32.0)
MCHC: 32.1 g/dL (ref 32.0–36.0)
MCV: 86.2 fL (ref 78.0–99.0)
MONOCYTE #: 0.33 10*3/uL (ref 0.00–1.30)
MONOCYTE %: 9 % (ref 0–12)
MPV: 8.6 fL (ref 7.4–10.4)
NEUTROPHIL #: 2.15 10*3/uL (ref 1.80–8.40)
NEUTROPHIL %: 56 % (ref 40–76)
PLATELETS: 141 10*3/uL (ref 140–440)
RBC: 4.22 10*6/uL (ref 4.20–5.40)
RDW: 17.6 % — ABNORMAL HIGH (ref 11.6–14.8)
WBC: 3.9 10*3/uL — ABNORMAL LOW (ref 4.0–10.5)

## 2022-10-07 LAB — COMPREHENSIVE METABOLIC PANEL, NON-FASTING
ALBUMIN/GLOBULIN RATIO: 0.7 — ABNORMAL LOW (ref 0.8–1.4)
ALBUMIN: 2.8 g/dL — ABNORMAL LOW (ref 3.4–5.0)
ALKALINE PHOSPHATASE: 82 U/L (ref 46–116)
ALT (SGPT): 64 U/L (ref <=78)
ANION GAP: 3 mmol/L — ABNORMAL LOW (ref 4–13)
AST (SGOT): 72 U/L — ABNORMAL HIGH (ref 15–37)
BILIRUBIN TOTAL: 0.2 mg/dL (ref 0.2–1.0)
BUN/CREA RATIO: 11
BUN: 11 mg/dL (ref 7–18)
CALCIUM, CORRECTED: 9.5 mg/dL
CALCIUM: 8.5 mg/dL (ref 8.5–10.1)
CHLORIDE: 106 mmol/L (ref 98–107)
CO2 TOTAL: 30 mmol/L (ref 21–32)
CREATININE: 1.04 mg/dL — ABNORMAL HIGH (ref 0.55–1.02)
ESTIMATED GFR: 62 mL/min/{1.73_m2} (ref >59)
GLOBULIN: 3.9
GLUCOSE: 70 mg/dL — ABNORMAL LOW (ref 74–106)
OSMOLALITY, CALCULATED: 275 mOsm/kg (ref 270–290)
POTASSIUM: 4.5 mmol/L (ref 3.5–5.1)
PROTEIN TOTAL: 6.7 g/dL (ref 6.4–8.2)
SODIUM: 139 mmol/L (ref 136–145)

## 2022-10-07 LAB — NT-PROBNP: NT-PROBNP: 35 pg/mL (ref <=125)

## 2022-10-07 MED ORDER — FAMOTIDINE 20 MG TABLET
ORAL_TABLET | ORAL | Status: AC
Start: 2022-10-07 — End: 2022-10-07
  Filled 2022-10-07: qty 1

## 2022-10-07 MED ORDER — SODIUM CHLORIDE 0.9 % IV BOLUS
1000.0000 mL | INJECTION | Status: DC
Start: 2022-10-07 — End: 2022-10-07

## 2022-10-07 MED ORDER — FAMOTIDINE 20 MG TABLET
20.0000 mg | ORAL_TABLET | ORAL | Status: AC
Start: 2022-10-07 — End: 2022-10-07
  Administered 2022-10-07: 20 mg via ORAL

## 2022-10-07 MED ORDER — DEXAMETHASONE SODIUM PHOSPHATE (PF) 10 MG/ML INJECTION SOLUTION
10.0000 mg | INTRAMUSCULAR | Status: AC
Start: 2022-10-07 — End: 2022-10-07
  Administered 2022-10-07: 10 mg via INTRAMUSCULAR

## 2022-10-07 MED ORDER — PREDNISONE 20 MG TABLET
20.0000 mg | ORAL_TABLET | Freq: Every day | ORAL | 0 refills | Status: AC
Start: 2022-10-07 — End: 2022-10-12

## 2022-10-07 MED ORDER — DEXAMETHASONE SODIUM PHOSPHATE (PF) 10 MG/ML INJECTION SOLUTION
10.0000 mg | INTRAMUSCULAR | Status: DC
Start: 2022-10-07 — End: 2022-10-07

## 2022-10-07 MED ORDER — FAMOTIDINE (PF) 20 MG/2 ML INTRAVENOUS SOLUTION
20.0000 mg | INTRAVENOUS | Status: DC
Start: 2022-10-07 — End: 2022-10-07

## 2022-10-07 MED ORDER — FAMOTIDINE (PF) 20 MG/2 ML INTRAVENOUS SOLUTION
INTRAVENOUS | Status: AC
Start: 2022-10-07 — End: 2022-10-07
  Filled 2022-10-07: qty 2

## 2022-10-07 MED ORDER — DEXAMETHASONE SODIUM PHOSPHATE (PF) 10 MG/ML INJECTION SOLUTION
INTRAMUSCULAR | Status: AC
Start: 2022-10-07 — End: 2022-10-07
  Filled 2022-10-07: qty 1

## 2022-10-07 NOTE — ED Provider Notes (Signed)
[facility]  ED Primary Provider Note  Patient Name: Martha White  Patient Age: 57 y.o.  Date of Birth: 1965/10/02    Chief Complaint: Allergic reaction        History of Present Illness       Martha White is a 57 y.o. female who had concerns including Allergic reaction. Patient presents to ED today for an allergic reaction. Patients problems initially started when she had over 20 teeth extracted what has been 3 weeks ago. Patient was seen an evaluate for this on 7/18  at our facility. Patients ABX were changed by her dentist. Patient state she is still itching. Patient states now her legs are swelling and she is still itching. Patient states she did pick up the Pepcid and benadryl.         Review of Systems     No other overt Review of Systems are noted to be positive except noted in the HPI.      Historical Data   History Reviewed This Encounter: Medical History  Surgical History  Family History  Social History      Physical Exam   ED Triage Vitals [10/07/22 1530]   BP (Non-Invasive) 130/84   Heart Rate 86   Respiratory Rate 20   Temperature 36.3 C (97.4 F)   SpO2 100 %   Weight 65.8 kg (145 lb)   Height 1.499 m (4\' 11" )         Nursing notes reviewed for what could be assessed. Past Medical, Surgical, and Social history reviewed for what has been completed.     Constitutional: NAD. Well-Developed. Well Nourished.  Head: Normocephalic, atraumatic.  Mouth/Throat: moist mucus membranes.  Eyes: EOM grossly intact, conjunctiva normal.  Neck: Supple  Cardiovascular: Regular Rate and Rhythm, extremities well perfused.  Pulmonary/Chest: No respiratory distress. Lungs are symmetric to auscultation bilaterally.  Abdominal: Soft, non-tender, non-distended. Non peritoneal, no rebound, no guarding.  MSK: No Lower Extremity Edema.  Skin: Warm, dry, and intact  Neuro: Appropriate, CN II-XII grossly intact. Gait at patients baseline.  Psych: Pleasant           Procedures      Patient Data      Labs Ordered/Reviewed   COMPREHENSIVE METABOLIC PANEL, NON-FASTING - Abnormal; Notable for the following components:       Result Value    ANION GAP 3 (*)     CREATININE 1.04 (*)     ALBUMIN 2.8 (*)     GLUCOSE 70 (*)     AST (SGOT) 72 (*)     ALBUMIN/GLOBULIN RATIO 0.7 (*)     All other components within normal limits    Narrative:     Estimated Glomerular Filtration Rate (eGFR) is calculated using the CKD-EPI (2021) equation, intended for patients 59 years of age and older. If gender is not documented or "unknown", there will be no eGFR calculation.   CBC WITH DIFF - Abnormal; Notable for the following components:    WBC 3.9 (*)     HGB 11.7 (*)     HCT 36.4 (*)     RDW 17.6 (*)     All other components within normal limits    Narrative:     repeated   NT-PROBNP - Normal   CBC/DIFF    Narrative:     The following orders were created for panel order CBC/DIFF.  Procedure  Abnormality         Status                     ---------                               -----------         ------                     CBC WITH ZOXW[[id number]]                Abnormal            Final result                 Please view results for these tests on the individual orders.       No orders to display       Medical Decision Making          Medical Decision Making  Amount and/or Complexity of Data Reviewed  Labs: ordered.    Risk  Prescription drug management.          Studies Assessed: labs        MDM Narrative:  Patient presents to ED today for an allergic reaction. Patients problems initially started when she had over 20 teeth extracted what has been 3 weeks ago. Patient was seen an evaluate for this on 7/18  at our facility. Patients ABX were changed by her dentist. Patient state she is still itching. Patient states now her legs are swelling and she is still itching. Patient states she did pick up the Pepcid and benadryl.  Patient's labs are unremarkable.  Patient was given dose of steroids instructed  follow up with the has been dinner PCP.  Patient verbalized understanding.  Patient was discharged.             Medications Administered in the ED   famotidine (PEPCID) tablet (20 mg Oral Given 10/07/22 1859)   dexAMETHasone (PF) 10 mg/mL injection (10 mg IntraMUSCULAR Given 10/07/22 1900)       Following the history, physical exam, and ED workup, the patient was deemed stable and suitable for discharge. The patient/caregiver was advised to return to the ED for any new or worsening symptoms. Discharge medications, and follow-up instructions were discussed with the patient/caregiver in detail, who verbalizes understanding. The patient/caregiver is in agreement and is comfortable with the plan of care.    Disposition: Discharged         Current Discharge Medication List        START taking these medications.        Details   predniSONE 20 mg Tablet  Commonly known as: DELTASONE   20 mg, Oral, DAILY  Qty: 5 Tablet  Refills: 0            CONTINUE these medications - NO CHANGES were made during your visit.        Details   albuterol sulfate 90 mcg/actuation oral inhaler  Commonly known as: PROVENTIL or VENTOLIN or PROAIR   1-2 Puffs, Inhalation, EVERY 6 HOURS PRN  Refills: 0     amLODIPine 10 mg Tablet  Commonly known as: NORVASC   10 mg, Oral, DAILY  Refills: 0     aspirin 81 mg Tablet, Delayed Release (E.C.)  Commonly known as: ECOTRIN   81 mg, Oral, DAILY  Refills: 0  ATORVASTATIN ORAL   1 Tablet, Oral, DAILY  Refills: 0     bethanechol chloride 10 mg Tablet  Commonly known as: URECHOLINE   5 mg, Oral, DAILY  Refills: 0     clopidogreL 75 mg Tablet  Commonly known as: PLAVIX   75 mg, Oral, DAILY  Refills: 0     COMBIVENT INHL   Inhalation  Refills: 0     cyclobenzaprine 10 mg Tablet  Commonly known as: FLEXERIL   10 mg, Oral, 2 TIMES DAILY  Refills: 0     diphenhydrAMINE 50 mg Capsule  Commonly known as: BENADRYL   50 mg, Oral, EVERY 6 HOURS PRN  Qty: 28 Capsule  Refills: 0     DULoxetine 30 mg Capsule, Delayed  Release(E.C.)  Commonly known as: CYMBALTA DR   30 mg, Oral, DAILY  Refills: 0     ergocalciferol (vitamin D2) 1,250 mcg (50,000 unit) Capsule  Commonly known as: DRISDOL   50,000 Units, Oral, EVERY 7 DAYS  Refills: 0     famotidine 20 mg Tablet  Commonly known as: PEPCID   20 mg, Oral, 2 TIMES DAILY  Qty: 14 Tablet  Refills: 0     furosemide 20 mg Tablet  Commonly known as: LASIX   20 mg, Oral, DAILY  Refills: 0     gabapentin 400 mg Capsule  Commonly known as: NEURONTIN   400 mg, Oral, 3 TIMES DAILY  Refills: 0     HUMALOG KWIKPEN INSULIN SUBQ   Subcutaneous, 7-20 units tid before meals  Refills: 0     hydrOXYzine pamoate 25 mg Capsule  Commonly known as: VISTARIL   50 mg, Oral, DAILY  Refills: 0     ketorolac tromethamine 10 mg Tablet  Commonly known as: TORADOL   10 mg, Oral, EVERY 6 HOURS PRN  Qty: 15 Tablet  Refills: 0     Levemir FlexPen 100 unit/mL (3 mL) Insulin Pen  Generic drug: insulin detemir U-100   60 Units, Subcutaneous, DAILY, 60 units daily  Refills: 0     lisinopriL 20 mg Tablet  Commonly known as: PRINIVIL   20 mg, Oral, DAILY  Refills: 0     MetFORMIN 1,000 mg Tablet  Commonly known as: GLUCOPHAGE   1,000 mg, Oral, 2 TIMES DAILY WITH FOOD  Refills: 0     metoclopramide 100 mcg/mL Solution  Commonly known as: REGLAN   Oral, 3 TIMES DAILY BEFORE MEALS, 10/61ml - take one teaspoonful 22m by mouth  Refills: 0     metoprolol succinate 50 mg Tablet Sustained Release 24 hr  Commonly known as: TOPROL-XL   50 mg, Oral, DAILY  Refills: 0     omeprazole 40 mg Capsule, Delayed Release(E.C.)  Commonly known as: PRILOSEC   40 mg, Oral, DAILY  Refills: 0     ondansetron 4 mg Tablet, Rapid Dissolve  Commonly known as: ZOFRAN ODT   4 mg, Oral, EVERY 8 HOURS PRN  Qty: 12 Tablet  Refills: 0     rOPINIRole 1 mg Tablet  Commonly known as: REQUIP   3 mg, Oral, NIGHTLY  Refills: 0     tiZANidine 4 mg Tablet  Commonly known as: ZANAFLEX   4 mg, Oral, 3 TIMES DAILY  Qty: 20 Tablet  Refills: 0     traMADoL 50 mg  Tablet  Commonly known as: ULTRAM   50 mg, Oral, EVERY 6 HOURS PRN  Qty: 20 Tablet  Refills: 0     traZODone 100  mg Tablet  Commonly known as: DESYREL   100 mg, Oral, NIGHTLY  Refills: 0     Trulicity 0.75 mg/0.5 mL Pen Injector  Generic drug: dulaglutide   0.75 mg, Subcutaneous, EVERY 7 DAYS  Refills: 0            Follow up:   No follow-up provider specified.             Clinical Impression   Hives (Primary)   Pruritus         Discharge Medication List as of 10/07/2022  7:26 PM        START taking these medications    Details   predniSONE (DELTASONE) 20 mg Oral Tablet Take 1 Tablet (20 mg total) by mouth Once a day for 5 days, Disp-5 Tablet, R-0, E-Rx                 Bunnie Domino FNP-C  Department of Emergency Medicine

## 2022-10-07 NOTE — Discharge Instructions (Addendum)
Thank you for allowing us to be part of your care.    Please discuss all medications with your pharmacist to ensure there are no concerns of interactions.    Please ensure all questions or concerns are addressed prior to leaving the hospital. We want to make sure your concerns are addressed to make sure you are as safe and healthy as possible. By leaving the hospital, it is understood you are in agreement with your treatment plan.    You may have received sedating medication during your visit. Please discuss this with your discharging provider nurse as you may not be able to operate machines while the medication is in your system, or while you are taking any potentially sedating prescriptions.    Please call the hospital medical records office for a copy of your finalized results, and review them with a primary care physician, for any findings needing further attention.    If you feel your situation worsens, or does not get better in 48 hours, please see a physician for evaluation.    We encourage you to see your regular doctor as soon as possible to let them know you were seen in the emergency department. They may want to do further testing. If you do not have a doctor, please feel free to call the hospital, and ask for contact information of accepting providers. Please also discuss your vaccinations, and ensure all are up to date.    You may use this document to take today off work or school.

## 2022-10-07 NOTE — ED Nurses Note (Signed)
Verbalized understanding of discharge instructions, rx education, and follow up information. AVS given to patient.  VSS.  Left ER with no further complaints.

## 2022-10-07 NOTE — ED Nurses Note (Signed)
Multiple RN's unable to obtain IV access. Provider informed.

## 2022-10-07 NOTE — ED Triage Notes (Addendum)
Pt reports was seen in ED Thursday for allergic reaction to penicillin prescribed from dentist for tooth extraction. Reports prescribed meds not able to obtain relief from continuous itching. Denies SOB. Reports dental pain remains. Reports bilateral ankle swelling since Friday.

## 2022-10-07 NOTE — ED Nurses Note (Signed)
Patient reports being seen in ED on Thursday for an allergic reaction to penicillin. States that the rash and itching is still present and ankles started swelling Friday or Saturday. Mild rash noted to patients upper back, and swelling noted to bilateral lower extremities. Pulses present in bilateral lower extremities. Patient denies shortness of breath. No further concerns or complaints at this time.

## 2022-12-29 ENCOUNTER — Encounter (HOSPITAL_BASED_OUTPATIENT_CLINIC_OR_DEPARTMENT_OTHER): Payer: Self-pay

## 2022-12-29 ENCOUNTER — Emergency Department (HOSPITAL_BASED_OUTPATIENT_CLINIC_OR_DEPARTMENT_OTHER): Payer: Medicare (Managed Care)

## 2022-12-29 ENCOUNTER — Other Ambulatory Visit: Payer: Self-pay

## 2022-12-29 ENCOUNTER — Emergency Department
Admission: EM | Admit: 2022-12-29 | Discharge: 2022-12-29 | Disposition: A | Discharge status: Home / Self Care | Payer: Medicare (Managed Care) | Attending: Emergency Medicine | Admitting: Emergency Medicine

## 2022-12-29 DIAGNOSIS — R42 Dizziness and giddiness: Secondary | ICD-10-CM | POA: Insufficient documentation

## 2022-12-29 DIAGNOSIS — Z1152 Encounter for screening for COVID-19: Secondary | ICD-10-CM | POA: Insufficient documentation

## 2022-12-29 DIAGNOSIS — J029 Acute pharyngitis, unspecified: Secondary | ICD-10-CM | POA: Insufficient documentation

## 2022-12-29 DIAGNOSIS — J40 Bronchitis, not specified as acute or chronic: Secondary | ICD-10-CM | POA: Insufficient documentation

## 2022-12-29 DIAGNOSIS — R0789 Other chest pain: Secondary | ICD-10-CM

## 2022-12-29 DIAGNOSIS — J069 Acute upper respiratory infection, unspecified: Secondary | ICD-10-CM

## 2022-12-29 LAB — COVID-19, FLU A/B, RSV RAPID BY PCR
INFLUENZA VIRUS TYPE A: NOT DETECTED
INFLUENZA VIRUS TYPE B: NOT DETECTED
RESPIRATORY SYNCTIAL VIRUS (RSV): NOT DETECTED
SARS-CoV-2: NOT DETECTED

## 2022-12-29 LAB — URINALYSIS, MACRO/MICRO
BILIRUBIN: NEGATIVE mg/dL
BLOOD: NEGATIVE mg/dL
GLUCOSE: NEGATIVE mg/dL
KETONES: NEGATIVE mg/dL
LEUKOCYTES: NEGATIVE WBCs/uL
NITRITE: NEGATIVE
PH: 6 (ref 4.6–8.0)
PROTEIN: NEGATIVE mg/dL
SPECIFIC GRAVITY: 1.015 (ref 1.003–1.035)
UROBILINOGEN: 0.2 mg/dL (ref 0.2–1.0)

## 2022-12-29 LAB — CBC WITH DIFF
BASOPHIL #: 0.01 10*3/uL (ref 0.00–0.30)
BASOPHIL %: 0 % (ref 0–3)
EOSINOPHIL #: 0.13 10*3/uL (ref 0.00–0.80)
EOSINOPHIL %: 3 % (ref 1–7)
HCT: 33.6 % — ABNORMAL LOW (ref 37.0–47.0)
HGB: 11.2 g/dL — ABNORMAL LOW (ref 12.5–16.0)
LYMPHOCYTE #: 1.1 10*3/uL (ref 1.10–5.00)
LYMPHOCYTE %: 26 % (ref 25–45)
MCH: 28.3 pg (ref 27.0–32.0)
MCHC: 33.2 g/dL (ref 32.0–36.0)
MCV: 85.4 fL (ref 78.0–99.0)
MONOCYTE #: 0.55 10*3/uL (ref 0.00–1.30)
MONOCYTE %: 13 % — ABNORMAL HIGH (ref 0–12)
MPV: 7.3 fL — ABNORMAL LOW (ref 7.4–10.4)
NEUTROPHIL #: 2.38 10*3/uL (ref 1.80–8.40)
NEUTROPHIL %: 57 % (ref 40–76)
PLATELETS: 226 10*3/uL (ref 140–440)
RBC: 3.94 10*6/uL — ABNORMAL LOW (ref 4.20–5.40)
RDW: 16.5 % — ABNORMAL HIGH (ref 11.6–14.8)
WBC: 4.2 10*3/uL (ref 4.0–10.5)

## 2022-12-29 LAB — COMPREHENSIVE METABOLIC PANEL, NON-FASTING
ALBUMIN/GLOBULIN RATIO: 0.7 — ABNORMAL LOW (ref 0.8–1.4)
ALBUMIN: 2.5 g/dL — ABNORMAL LOW (ref 3.4–5.0)
ALKALINE PHOSPHATASE: 95 U/L (ref 46–116)
ALT (SGPT): 59 U/L (ref <=78)
ANION GAP: 9 mmol/L (ref 4–13)
AST (SGOT): 40 U/L — ABNORMAL HIGH (ref 15–37)
BILIRUBIN TOTAL: 0.5 mg/dL (ref 0.2–1.0)
BUN/CREA RATIO: 10
BUN: 10 mg/dL (ref 7–18)
CALCIUM, CORRECTED: 9.8 mg/dL
CALCIUM: 8.6 mg/dL (ref 8.5–10.1)
CHLORIDE: 102 mmol/L (ref 98–107)
CO2 TOTAL: 29 mmol/L (ref 21–32)
CREATININE: 1.04 mg/dL — ABNORMAL HIGH (ref 0.55–1.02)
ESTIMATED GFR: 62 mL/min/{1.73_m2} (ref >59)
GLOBULIN: 3.8
GLUCOSE: 101 mg/dL (ref 74–106)
OSMOLALITY, CALCULATED: 279 mosm/kg (ref 270–290)
POTASSIUM: 3.9 mmol/L (ref 3.5–5.1)
PROTEIN TOTAL: 6.3 g/dL — ABNORMAL LOW (ref 6.4–8.2)
SODIUM: 140 mmol/L (ref 136–145)

## 2022-12-29 LAB — MAGNESIUM: MAGNESIUM: 1.7 mg/dL — ABNORMAL LOW (ref 1.8–2.4)

## 2022-12-29 LAB — PT/INR
INR: 1.08 (ref 0.84–1.10)
PROTHROMBIN TIME: 12.6 s (ref 9.8–12.7)

## 2022-12-29 LAB — RAPID THROAT SCREEN, STREPTOCOCCUS, WITH REFLEX: THROAT RAPID SCREEN, STREPTOCOCCUS: NEGATIVE

## 2022-12-29 MED ORDER — ONDANSETRON HCL (PF) 4 MG/2 ML INJECTION SOLUTION
4.0000 mg | INTRAMUSCULAR | Status: AC
Start: 2022-12-29 — End: 2022-12-29
  Administered 2022-12-29: 4 mg via INTRAVENOUS

## 2022-12-29 MED ORDER — MECLIZINE 25 MG TABLET
ORAL_TABLET | ORAL | Status: AC
Start: 2022-12-29 — End: 2022-12-29
  Filled 2022-12-29: qty 1

## 2022-12-29 MED ORDER — KETOROLAC 30 MG/ML (1 ML) INJECTION SOLUTION
30.0000 mg | INTRAMUSCULAR | Status: AC
Start: 2022-12-29 — End: 2022-12-29
  Administered 2022-12-29: 30 mg via INTRAVENOUS

## 2022-12-29 MED ORDER — AZITHROMYCIN 250 MG TABLET
ORAL_TABLET | ORAL | Status: AC
Start: 2022-12-29 — End: 2022-12-29
  Filled 2022-12-29: qty 2

## 2022-12-29 MED ORDER — ONDANSETRON HCL (PF) 4 MG/2 ML INJECTION SOLUTION
INTRAMUSCULAR | Status: AC
Start: 2022-12-29 — End: 2022-12-29
  Filled 2022-12-29: qty 2

## 2022-12-29 MED ORDER — MECLIZINE 25 MG TABLET
25.0000 mg | ORAL_TABLET | Freq: Three times a day (TID) | ORAL | 0 refills | Status: AC
Start: 2022-12-29 — End: 2023-01-29

## 2022-12-29 MED ORDER — AZITHROMYCIN 250 MG TABLET
500.0000 mg | ORAL_TABLET | ORAL | Status: AC
Start: 2022-12-29 — End: 2022-12-29
  Administered 2022-12-29: 500 mg via ORAL

## 2022-12-29 MED ORDER — ACETAMINOPHEN 325 MG TABLET
975.0000 mg | ORAL_TABLET | ORAL | Status: AC
Start: 2022-12-29 — End: 2022-12-29
  Administered 2022-12-29: 975 mg via ORAL

## 2022-12-29 MED ORDER — MECLIZINE 25 MG TABLET
25.0000 mg | ORAL_TABLET | ORAL | Status: AC
Start: 2022-12-29 — End: 2022-12-29
  Administered 2022-12-29: 25 mg via ORAL

## 2022-12-29 MED ORDER — KETOROLAC 30 MG/ML (1 ML) INJECTION SOLUTION
INTRAMUSCULAR | Status: AC
Start: 2022-12-29 — End: 2022-12-29
  Filled 2022-12-29: qty 1

## 2022-12-29 MED ORDER — AZITHROMYCIN 500 MG TABLET
500.0000 mg | ORAL_TABLET | ORAL | 0 refills | Status: DC
Start: 2022-12-29 — End: 2023-01-28

## 2022-12-29 MED ORDER — ACETAMINOPHEN 325 MG TABLET
ORAL_TABLET | ORAL | Status: AC
Start: 2022-12-29 — End: 2022-12-29
  Filled 2022-12-29: qty 3

## 2022-12-29 MED ORDER — SODIUM CHLORIDE 0.9 % IV BOLUS
1000.0000 mL | INJECTION | Status: AC
Start: 2022-12-29 — End: 2022-12-29
  Administered 2022-12-29: 0 mL via INTRAVENOUS
  Administered 2022-12-29: 1000 mL via INTRAVENOUS

## 2022-12-29 MED ORDER — IOHEXOL 350 MG IODINE/ML INTRAVENOUS SOLUTION
100.0000 mL | INTRAVENOUS | Status: AC
Start: 2022-12-29 — End: 2022-12-29
  Administered 2022-12-29: 100 mL via INTRAVENOUS

## 2022-12-29 NOTE — ED Triage Notes (Signed)
HA, fever, cough, body aches, SOB for the last week.  Decreased appetite.

## 2022-12-29 NOTE — ED Nurses Note (Signed)
Provided pt with ice pack for swelling d/t IV contrast infiltration.

## 2022-12-29 NOTE — ED Nurses Note (Signed)
Pt c/o nausea. Dr Ermalinda Memos notified. Orders pending.

## 2022-12-29 NOTE — ED Nurses Note (Signed)
Pt c/o acute pain of IV infiltration of IV contrast. Dr Ermalinda Memos notified. Orders pending.

## 2022-12-29 NOTE — ED Attending Note (Signed)
[facility], [facility] - Emergency Department  Emergency Department  Course Note    Care/report received from Dr. Vincent Gros  7:30 P.M.  Per report:  Martha White is a 57 y.o. female who had concerns including Flu Like Symptoms.  Patient with flu-like symptoms probable bronchitis waiting CTA chest and abdomen    Pending labs/imaging/consults:  Waiting CTA chest abdomen  Plan:  Awaiting CTA chest and abdomen results    Course:   ED Course as of 12/29/22 2132   Sat Dec 29, 2022   2131 CTA chest and abdomen negative for any pathology patient will be discharged home     Following the history, physical exam, and ED workup, the patient was deemed stable and suitable for discharge. The patient/caregiver was advised to return to the ED for any new or worsening symptoms. Discharge medications, and follow-up instructions were discussed with the patient/caregiver in detail, who verbalizes understanding. The patient/caregiver is in agreement and is comfortable with the plan of care.    Disposition:  Patient awaiting CTA chest and abdomen results       Current Discharge Medication List        START taking these medications.        Details   azithromycin 500 mg Tablet  Commonly known as: ZITHROMAX   500 mg, Oral, EVERY 24 HOURS  Qty: 6 Tablet  Refills: 0     meclizine 25 mg Tablet  Commonly known as: ANTIVERT   25 mg, Oral, 3 TIMES DAILY  Qty: 30 Tablet  Refills: 0            CONTINUE these medications - NO CHANGES were made during your visit.        Details   albuterol sulfate 90 mcg/actuation oral inhaler  Commonly known as: PROVENTIL or VENTOLIN or PROAIR   1-2 Puffs, Inhalation, EVERY 6 HOURS PRN  Refills: 0     amLODIPine 10 mg Tablet  Commonly known as: NORVASC   10 mg, Oral, DAILY  Refills: 0     aspirin 81 mg Tablet, Delayed Release (E.C.)  Commonly known as: ECOTRIN   81 mg, Oral, DAILY  Refills: 0     ATORVASTATIN ORAL   1 Tablet, Oral, DAILY  Refills: 0     bethanechol chloride 10 mg Tablet  Commonly  known as: URECHOLINE   5 mg, Oral, DAILY  Refills: 0     clopidogreL 75 mg Tablet  Commonly known as: PLAVIX   75 mg, Oral, DAILY  Refills: 0     COMBIVENT INHL   Inhalation  Refills: 0     cyclobenzaprine 10 mg Tablet  Commonly known as: FLEXERIL   10 mg, Oral, 2 TIMES DAILY  Refills: 0     DULoxetine 30 mg Capsule, Delayed Release(E.C.)  Commonly known as: CYMBALTA DR   30 mg, Oral, DAILY  Refills: 0     ergocalciferol (vitamin D2) 1,250 mcg (50,000 unit) Capsule  Commonly known as: DRISDOL   50,000 Units, Oral, EVERY 7 DAYS  Refills: 0     furosemide 20 mg Tablet  Commonly known as: LASIX   20 mg, Oral, DAILY  Refills: 0     gabapentin 400 mg Capsule  Commonly known as: NEURONTIN   400 mg, Oral, 3 TIMES DAILY  Refills: 0     HUMALOG KWIKPEN INSULIN SUBQ   Subcutaneous, 7-20 units tid before meals  Refills: 0     hydrOXYzine pamoate 25 mg Capsule  Commonly known as:  VISTARIL   50 mg, Oral, DAILY  Refills: 0     ketorolac tromethamine 10 mg Tablet  Commonly known as: TORADOL   10 mg, Oral, EVERY 6 HOURS PRN  Qty: 15 Tablet  Refills: 0     Levemir FlexPen 100 unit/mL (3 mL) Insulin Pen  Generic drug: insulin detemir U-100   60 Units, Subcutaneous, DAILY, 60 units daily  Refills: 0     lisinopriL 20 mg Tablet  Commonly known as: PRINIVIL   20 mg, Oral, DAILY  Refills: 0     MetFORMIN 1,000 mg Tablet  Commonly known as: GLUCOPHAGE   1,000 mg, Oral, 2 TIMES DAILY WITH FOOD  Refills: 0     metoclopramide 100 mcg/mL Solution  Commonly known as: REGLAN   Oral, 3 TIMES DAILY BEFORE MEALS, 10/39ml - take one teaspoonful 33m by mouth  Refills: 0     metoprolol succinate 50 mg Tablet Sustained Release 24 hr  Commonly known as: TOPROL-XL   50 mg, Oral, DAILY  Refills: 0     omeprazole 40 mg Capsule, Delayed Release(E.C.)  Commonly known as: PRILOSEC   40 mg, Oral, DAILY  Refills: 0     ondansetron 4 mg Tablet, Rapid Dissolve  Commonly known as: ZOFRAN ODT   4 mg, Oral, EVERY 8 HOURS PRN  Qty: 12 Tablet  Refills: 0      rOPINIRole 1 mg Tablet  Commonly known as: REQUIP   3 mg, Oral, NIGHTLY  Refills: 0     tiZANidine 4 mg Tablet  Commonly known as: ZANAFLEX   4 mg, Oral, 3 TIMES DAILY  Qty: 20 Tablet  Refills: 0     traMADoL 50 mg Tablet  Commonly known as: ULTRAM   50 mg, Oral, EVERY 6 HOURS PRN  Qty: 20 Tablet  Refills: 0     traZODone 100 mg Tablet  Commonly known as: DESYREL   100 mg, Oral, NIGHTLY  Refills: 0     Trulicity 0.75 mg/0.5 mL Pen Injector  Generic drug: dulaglutide   0.75 mg, Subcutaneous, EVERY 7 DAYS  Refills: 0            Follow up:   Ty Hilts, CNP  [address] [place] [address]  [phone]    Schedule an appointment as soon as possible for a visit in 3 days  If symptoms worsen      Clinical Impression   Upper respiratory tract infection, unspecified type (Primary)   Bronchitis   Pharyngitis, unspecified etiology   Dizziness   Vertigo   Atypical chest pain         Tivis Ringer, MD

## 2022-12-29 NOTE — ED Nurses Note (Signed)
Pt reports sharp pain persists under the left breast radiating to the left upper thoracic. Denies SOB.

## 2022-12-29 NOTE — ED Nurses Note (Signed)
Patient discharged home with family.  AVS reviewed with patient/care giver.  A written copy of the AVS and discharge instructions was given to the patient/care giver. Scripts handed to patient/care giver. Questions sufficiently answered as needed.  Patient/care giver encouraged to follow up with PCP as indicated.  In the event of an emergency, patient/care giver instructed to call 911 or go to the nearest emergency room.

## 2022-12-29 NOTE — ED Nurses Note (Signed)
Pt c/o pain under left breast radiating to the left upper back. Stated "I feel worse now than I did before I came." Pt sitting up in bed with increased anxiety. Dr Ermalinda Memos notified. Orders pending.

## 2022-12-29 NOTE — ED Nurses Note (Signed)
Pt c/o congested cough, body aches and fever, weakness for approx 2 weeks. Reports taking OTC meds with no relief. Reports c/o vertigo. Stated decreased appetite and decreased fluid intakes. Stated hx COPD and asthma.

## 2022-12-29 NOTE — ED Provider Notes (Addendum)
[facility], [facility] - Emergency Department  ED Primary Provider Note  History of Present Illness   Chief Complaint   Patient presents with    Flu White Symptoms     Martha White is a 57 y.o. female who had concerns including Flu White Symptoms.  Arrival: The patient arrived by Car complaining feeling lightheaded and dizzy to where the room is moving on her.  Patient almost fell in the room while being examined.  She denies any one-sided weakness.  No numbness or tingling in the extremity.  Patient has been having a cough nonproductive for the past 2 weeks.  She has not nasal congestion and postnasal drip.  She is also complaining of a sore throat.  She states coughing nonstop which gives her headache.  She denied any difficulty swallowing or change in voice.  No numbness or tingling.  Patient denies any nausea vomiting or diarrhea.  No abdominal pain.    HPI  Review of Systems   Review of Systems   Constitutional:  Positive for activity change and appetite change. Negative for chills and fever.   HENT:  Positive for congestion, postnasal drip, rhinorrhea and sore throat. Negative for ear pain.    Eyes:  Negative for pain and visual disturbance.   Respiratory:  Positive for cough. Negative for shortness of breath.    Cardiovascular:  Negative for chest pain and palpitations.   Gastrointestinal:  Negative for abdominal pain and vomiting.   Genitourinary:  Negative for dysuria and hematuria.   Musculoskeletal:  Positive for arthralgias and myalgias. Negative for back pain.   Skin:  Negative for color change and rash.   Neurological:  Negative for seizures and syncope.   All other systems reviewed and are negative.     Historical Data   History Reviewed This Encounter:     Physical Exam   ED Triage Vitals [12/29/22 1200]   BP (Non-Invasive) 131/86   Heart Rate (!) 103   Respiratory Rate 18   Temperature 37 C (98.6 F)   SpO2 100 %   Weight 65.8 kg (145 lb)   Height 1.499 m (4\' 11" )     Physical  Exam  Vitals and nursing note reviewed.   Constitutional:       General: She is not in acute distress.     Appearance: Normal appearance. She is well-developed and normal weight.   HENT:      Head: Normocephalic and atraumatic.      Right Ear: External ear normal.      Left Ear: External ear normal.      Nose: Congestion and rhinorrhea present.      Mouth/Throat:      Pharynx: Posterior oropharyngeal erythema present.      Comments: Positive erythema posterior pharynx.  Eyes:      Extraocular Movements: Extraocular movements intact.      Conjunctiva/sclera: Conjunctivae normal.      Pupils: Pupils are equal, round, and reactive to light.   Cardiovascular:      Rate and Rhythm: Regular rhythm. Tachycardia present.      Pulses: Normal pulses.      Heart sounds: Normal heart sounds. No murmur heard.  Pulmonary:      Effort: Pulmonary effort is normal. No respiratory distress.      Breath sounds: Normal breath sounds.   Abdominal:      General: Bowel sounds are normal.      Palpations: Abdomen is soft.  Tenderness: There is no abdominal tenderness.   Musculoskeletal:         General: No swelling. Normal range of motion.      Cervical back: Normal range of motion and neck supple.   Skin:     General: Skin is warm and dry.      Capillary Refill: Capillary refill takes less than 2 seconds.   Neurological:      General: No focal deficit present.      Mental Status: She is alert and oriented to person, place, and time.   Psychiatric:         Mood and Affect: Mood normal.         Behavior: Behavior normal.         Thought Content: Thought content normal.       Patient Data     Labs Ordered/Reviewed   COMPREHENSIVE METABOLIC PANEL, NON-FASTING - Abnormal; Notable for the following components:       Result Value    CREATININE 1.04 (*)     ALBUMIN 2.5 (*)     AST (SGOT) 40 (*)     PROTEIN TOTAL 6.3 (*)     ALBUMIN/GLOBULIN RATIO 0.7 (*)     All other components within normal limits    Narrative:     Estimated Glomerular  Filtration Rate (eGFR) is calculated using the CKD-EPI (2021) equation, intended for patients 51 years of age and older. If gender is not documented or "unknown", there will be no eGFR calculation.   MAGNESIUM - Abnormal; Notable for the following components:    MAGNESIUM 1.7 (*)     All other components within normal limits   CBC WITH DIFF - Abnormal; Notable for the following components:    RBC 3.94 (*)     HGB 11.2 (*)     HCT 33.6 (*)     RDW 16.5 (*)     MPV 7.3 (*)     MONOCYTE % 13 (*)     All other components within normal limits    Narrative:     repeated   RAPID THROAT SCREEN, STREPTOCOCCUS, WITH REFLEX - Normal    Narrative:     Walk-Away Mode   COVID-19, FLU A/B, RSV RAPID BY PCR - Normal    Narrative:     Results are for the simultaneous qualitative identification of SARS-CoV-2 (formerly 2019-nCoV), Influenza A, Influenza B, and RSV RNA. These etiologic agents are generally detectable in nasopharyngeal and nasal swabs during the ACUTE PHASE of infection. Hence, this test is intended to be performed on respiratory specimens collected from individuals with signs and symptoms of upper respiratory tract infection who meet Centers for Disease Control and Prevention (CDC) clinical and/or epidemiological criteria for Coronavirus Disease 2019 (COVID-19) testing. CDC COVID-19 criteria for testing on human specimens is available at [facility] webpage information for Healthcare Professionals: Coronavirus Disease 2019 (COVID-19) ([email or web address]).     False-negative results may occur if the virus has genomic mutations, insertions, deletions, or rearrangements or if performed very early in the course of illness. Otherwise, negative results indicate virus specific RNA targets are not detected, however negative results do not preclude SARS-CoV-2 infection/COVID-19, Influenza, or Respiratory syncytial virus infection. Results should not be used as the sole basis for patient  management decisions. Negative results must be combined with clinical observations, patient history, and epidemiological information. If upper respiratory tract infection is still suspected based on exposure history together with other clinical findings, re-testing should be considered.  Test methodology:   Cepheid Xpert Xpress SARS-CoV-2/Flu/RSV Assay real-time polymerase chain reaction (RT-PCR) test on the GeneXpert Dx and Xpert Xpress systems.   PT/INR - Normal    Narrative:     Coumadin therapy INR range for Conventional Anticoagulation is 2.0 to 3.0 and for Intensive Anticoagulation 2.5 to 3.5.   URINALYSIS, MACRO/MICRO - Normal   THROAT CULTURE, BETA HEMOLYTIC STREPTOCOCCUS   CBC/DIFF    Narrative:     The following orders were created for panel order CBC/DIFF.  Procedure                               Abnormality         Status                     ---------                               -----------         ------                     CBC WITH YNWG[[id number]]                Abnormal            Final result                 Please view results for these tests on the individual orders.   URINALYSIS WITH REFLEX MICROSCOPIC AND CULTURE IF POSITIVE    Narrative:     The following orders were created for panel order URINALYSIS WITH REFLEX MICROSCOPIC AND CULTURE IF POSITIVE.  Procedure                               Abnormality         Status                     ---------                               -----------         ------                     URINALYSIS, MACRO/MICRO[[id number]]      Normal              Final result                 Please view results for these tests on the individual orders.     XR AP MOBILE CHEST   Final Result by Edi, Radresults In (10/12 1256)   NEGATIVE CHEST            Radiologist location ID: [id number]         CT BRAIN WO IV CONTRAST   Final Result by Edi, Radresults In (10/12 1258)   NO ACUTE FINDINGS         One or more dose reduction techniques were used (e.g., Automated exposure  control, adjustment of the mA and/or kV according to patient size, use of iterative reconstruction technique).         Radiologist location ID: [id number]           Medical Decision Making  Medical Decision Making  Patient is 57 year old black female complaining of aches and pains all over.  Patient has been coughing nonproductive for the past 2 weeks.  She has also complaining of feeling lightheaded and dizzy this morning when she woke up.  She is describing vertigo White symptoms.  Patient denied any fever or chills.  Patient is complaining of a sore throat as well as nasal congestion postnasal drip.  Patient denies any earache or severe headache.  Patient is complaining of left-sided posterior chest pain every time she coughs.  Patient denies any smoking history.  No dysuria or increased frequency.  No nausea vomiting or diarrhea.  Patient will have a for Plex done as well as strep titer.  She will also have labs and an IV placed.  Patient will get hydrated and have chest x-ray as well as a CT scan of the head.  Patient will start on Antivert to see if the dizziness goes away.  Patient will then follow up with PMD in the next 3-4 days.        Care of the patient was handed off to Dr. elder at 7:30 p.m.Marland Kitchen  Please refer to their ED course note for further details on patient's ED course, further imaging, ultimately patient disposition.    Amount and/or Complexity of Data Reviewed  Labs: ordered.  Radiology: ordered.  ECG/medicine tests: ordered.     Details: Normal sinus rhythm 95, PR interval 166 MS, QT interval 326 MS, otherwise normal EKG    Risk  OTC drugs.  Prescription drug management.             Medications Administered in the ED   NS bolus infusion 1,000 mL (0 mL Intravenous Stopped 12/29/22 1334)   meclizine (ANTIVERT) tablet (25 mg Oral Given 12/29/22 1234)   acetaminophen (TYLENOL) tablet (975 mg Oral Given 12/29/22 1234)   azithromycin (ZITHROMAX) tablet (500 mg Oral Given 12/29/22 1439)    ondansetron (ZOFRAN) 2 mg/mL injection (4 mg Intravenous Given 12/29/22 1505)   ketorolac (TORADOL) 30 mg/mL injection (30 mg Intravenous Given 12/29/22 1604)     Clinical Impression   Upper respiratory tract infection, unspecified type (Primary)   Bronchitis   Pharyngitis, unspecified etiology   Dizziness   Vertigo       Disposition: Data Unavailable               Clinical Impression   Upper respiratory tract infection, unspecified type (Primary)   Bronchitis   Pharyngitis, unspecified etiology   Dizziness   Vertigo       Current Discharge Medication List        START taking these medications    Details   azithromycin (ZITHROMAX) 500 mg Oral Tablet Take 1 Tablet (500 mg total) by mouth Every 24 hours  Qty: 6 Tablet, Refills: 0      meclizine (ANTIVERT) 25 mg Oral Tablet Take 1 Tablet (25 mg total) by mouth Three times a day for 10 days  Qty: 30 Tablet, Refills: 0

## 2022-12-31 DIAGNOSIS — I499 Cardiac arrhythmia, unspecified: Secondary | ICD-10-CM

## 2022-12-31 LAB — ECG 12 LEAD
Atrial Rate: 95 {beats}/min
Calculated P Axis: 16 degrees
Calculated R Axis: 48 degrees
Calculated T Axis: -4 degrees
PR Interval: 166 ms
QRS Duration: 80 ms
QT Interval: 326 ms
QTC Calculation: 409 ms
Ventricular rate: 95 {beats}/min

## 2023-01-01 LAB — THROAT CULTURE, BETA HEMOLYTIC STREPTOCOCCUS: THROAT CULTURE: NORMAL

## 2023-01-02 ENCOUNTER — Other Ambulatory Visit: Payer: Self-pay

## 2023-01-02 ENCOUNTER — Emergency Department (HOSPITAL_BASED_OUTPATIENT_CLINIC_OR_DEPARTMENT_OTHER): Payer: Medicare (Managed Care)

## 2023-01-02 ENCOUNTER — Emergency Department
Admission: EM | Admit: 2023-01-02 | Discharge: 2023-01-03 | Disposition: A | Discharge status: Home / Self Care | Payer: Medicare (Managed Care) | Attending: Emergency Medicine | Admitting: Emergency Medicine

## 2023-01-02 DIAGNOSIS — R000 Tachycardia, unspecified: Secondary | ICD-10-CM | POA: Insufficient documentation

## 2023-01-02 DIAGNOSIS — R55 Syncope and collapse: Secondary | ICD-10-CM | POA: Insufficient documentation

## 2023-01-02 DIAGNOSIS — R9431 Abnormal electrocardiogram [ECG] [EKG]: Secondary | ICD-10-CM | POA: Insufficient documentation

## 2023-01-02 LAB — CBC WITH DIFF
BASOPHIL #: 0.02 10*3/uL (ref 0.00–0.30)
BASOPHIL %: 0 % (ref 0–3)
EOSINOPHIL #: 0.08 10*3/uL (ref 0.00–0.80)
EOSINOPHIL %: 2 % (ref 1–7)
HCT: 34.7 % — ABNORMAL LOW (ref 37.0–47.0)
HGB: 11.1 g/dL — ABNORMAL LOW (ref 12.5–16.0)
LYMPHOCYTE #: 1.15 10*3/uL (ref 1.10–5.00)
LYMPHOCYTE %: 30 % (ref 25–45)
MCH: 27.3 pg (ref 27.0–32.0)
MCHC: 32 g/dL (ref 32.0–36.0)
MCV: 85.2 fL (ref 78.0–99.0)
MONOCYTE #: 0.31 10*3/uL (ref 0.00–1.30)
MONOCYTE %: 8 % (ref 0–12)
MPV: 7.5 fL (ref 7.4–10.4)
NEUTROPHIL #: 2.25 10*3/uL (ref 1.80–8.40)
NEUTROPHIL %: 59 % (ref 40–76)
PLATELETS: 289 10*3/uL (ref 140–440)
RBC: 4.07 10*6/uL — ABNORMAL LOW (ref 4.20–5.40)
RDW: 16.8 % — ABNORMAL HIGH (ref 11.6–14.8)
WBC: 3.8 10*3/uL — ABNORMAL LOW (ref 4.0–10.5)

## 2023-01-02 MED ORDER — ASPIRIN 81 MG CHEWABLE TABLET
324.0000 mg | CHEWABLE_TABLET | ORAL | Status: AC
Start: 2023-01-02 — End: 2023-01-02
  Administered 2023-01-02: 324 mg via ORAL

## 2023-01-02 MED ORDER — ASPIRIN 81 MG CHEWABLE TABLET
CHEWABLE_TABLET | ORAL | Status: AC
Start: 2023-01-02 — End: 2023-01-02
  Filled 2023-01-02: qty 4

## 2023-01-02 NOTE — ED Triage Notes (Signed)
Pt here visiting son in ED, pt began having a difficult time and became very anxious having a near syncopal episode witnessed by staff . Pt reports left hand numbness

## 2023-01-02 NOTE — ED Provider Notes (Incomplete)
[facility], [facility] - Emergency Department  ED Primary Provider Note  HPI:  Martha White is a 57 y.o. female     Patient had a witnessed syncopal episode here in the department.  She has been stressed with the management of her son.  As she was speaking her eyes rolled up into her head and she did pass out for.  Proximally 2 minutes.  There was no tongue biting there was no loss of continence.  Patient has been complaining of chest pain for least 5 days believes it is secondary to persistent cough.  She was seen in the department 12/29/2022    ROS review and negative aside from stated in HPI.    Physical Exam:  ED Triage Vitals [01/02/23 2249]   BP (Non-Invasive) (!) 143/126   Heart Rate (!) 107   Respiratory Rate (!) 22   Temperature 36.8 C (98.2 F)   SpO2 98 %   Weight 65.8 kg (145 lb)   Height 1.499 m (4\' 11" )     ***    Patient data:  Labs Ordered/Reviewed - No data to display  No orders to display       MDM:  ***  ED Course as of 01/02/23 2301   Wed Jan 02, 2023   2256   He was not able to and for how treatment decisions would apply to him as his current personality states he just wants to die Believe the patient has severe limitation compare information and understand/in further consequences of his choices.      Data Unavailable  Clinical Impression   None     Medications Administered in the ED   aspirin chewable tablet 324 mg (has no administration in time range)        Current Discharge Medication List        CONTINUE these medications - NO CHANGES were made during your visit.        Details   albuterol sulfate 90 mcg/actuation oral inhaler  Commonly known as: PROVENTIL or VENTOLIN or PROAIR   1-2 Puffs, Inhalation, EVERY 6 HOURS PRN  Refills: 0     amLODIPine 10 mg Tablet  Commonly known as: NORVASC   10 mg, Oral, DAILY  Refills: 0     aspirin 81 mg Tablet, Delayed Release (E.C.)  Commonly known as: ECOTRIN   81 mg, Oral, DAILY  Refills: 0     ATORVASTATIN ORAL   1 Tablet, Oral,  DAILY  Refills: 0     azithromycin 500 mg Tablet  Commonly known as: ZITHROMAX   500 mg, Oral, EVERY 24 HOURS  Qty: 6 Tablet  Refills: 0     bethanechol chloride 10 mg Tablet  Commonly known as: URECHOLINE   5 mg, Oral, DAILY  Refills: 0     clopidogreL 75 mg Tablet  Commonly known as: PLAVIX   75 mg, Oral, DAILY  Refills: 0     COMBIVENT INHL   Inhalation  Refills: 0     cyclobenzaprine 10 mg Tablet  Commonly known as: FLEXERIL   10 mg, Oral, 2 TIMES DAILY  Refills: 0     DULoxetine 30 mg Capsule, Delayed Release(E.C.)  Commonly known as: CYMBALTA DR   30 mg, Oral, DAILY  Refills: 0     ergocalciferol (vitamin D2) 1,250 mcg (50,000 unit) Capsule  Commonly known as: DRISDOL   50,000 Units, Oral, EVERY 7 DAYS  Refills: 0     furosemide 20 mg Tablet  Commonly  known as: LASIX   20 mg, Oral, DAILY  Refills: 0     gabapentin 400 mg Capsule  Commonly known as: NEURONTIN   400 mg, Oral, 3 TIMES DAILY  Refills: 0     HUMALOG KWIKPEN INSULIN SUBQ   Subcutaneous, 7-20 units tid before meals  Refills: 0     hydrOXYzine pamoate 25 mg Capsule  Commonly known as: VISTARIL   50 mg, Oral, DAILY  Refills: 0     ketorolac tromethamine 10 mg Tablet  Commonly known as: TORADOL   10 mg, Oral, EVERY 6 HOURS PRN  Qty: 15 Tablet  Refills: 0     Levemir FlexPen 100 unit/mL (3 mL) Insulin Pen  Generic drug: insulin detemir U-100   60 Units, Subcutaneous, DAILY, 60 units daily  Refills: 0     lisinopriL 20 mg Tablet  Commonly known as: PRINIVIL   20 mg, Oral, DAILY  Refills: 0     meclizine 25 mg Tablet  Commonly known as: ANTIVERT   25 mg, Oral, 3 TIMES DAILY  Qty: 30 Tablet  Refills: 0     MetFORMIN 1,000 mg Tablet  Commonly known as: GLUCOPHAGE   1,000 mg, Oral, 2 TIMES DAILY WITH FOOD  Refills: 0     metoclopramide 100 mcg/mL Solution  Commonly known as: REGLAN   Oral, 3 TIMES DAILY BEFORE MEALS, 10/13ml - take one teaspoonful 63m by mouth  Refills: 0     metoprolol succinate 50 mg Tablet Sustained Release 24 hr  Commonly known as:  TOPROL-XL   50 mg, Oral, DAILY  Refills: 0     omeprazole 40 mg Capsule, Delayed Release(E.C.)  Commonly known as: PRILOSEC   40 mg, Oral, DAILY  Refills: 0     ondansetron 4 mg Tablet, Rapid Dissolve  Commonly known as: ZOFRAN ODT   4 mg, Oral, EVERY 8 HOURS PRN  Qty: 12 Tablet  Refills: 0     rOPINIRole 1 mg Tablet  Commonly known as: REQUIP   3 mg, Oral, NIGHTLY  Refills: 0     tiZANidine 4 mg Tablet  Commonly known as: ZANAFLEX   4 mg, Oral, 3 TIMES DAILY  Qty: 20 Tablet  Refills: 0     traMADoL 50 mg Tablet  Commonly known as: ULTRAM   50 mg, Oral, EVERY 6 HOURS PRN  Qty: 20 Tablet  Refills: 0     traZODone 100 mg Tablet  Commonly known as: DESYREL   100 mg, Oral, NIGHTLY  Refills: 0     Trulicity 0.75 mg/0.5 mL Pen Injector  Generic drug: dulaglutide   0.75 mg, Subcutaneous, EVERY 7 DAYS  Refills: 0

## 2023-01-02 NOTE — ED Provider Notes (Signed)
[facility], [facility] - Emergency Department  ED Primary Provider Note  HPI:  Martha White is a 57 y.o. female     Patient had a witnessed syncopal episode here in the department.  She has been stressed with the management of her son.  As she was speaking her eyes rolled up into her head and she did pass out for.  Proximally 2 minutes.  There was no tongue biting there was no loss of continence.  Patient has been complaining of chest pain for least 5 days believes it is secondary to persistent cough.  She was seen in the department 12/29/2022    ROS review and negative aside from stated in HPI.    Physical Exam:  ED Triage Vitals [01/02/23 2249]   BP (Non-Invasive) (!) 143/126   Heart Rate (!) 107   Respiratory Rate (!) 22   Temperature 36.8 C (98.2 F)   SpO2 98 %   Weight 65.8 kg (145 lb)   Height 1.499 m (4\' 11" )     ***    Patient data:  Labs Ordered/Reviewed - No data to display  No orders to display       MDM:  ***  ED Course as of 01/02/23 2301   Wed Jan 02, 2023   2256   He was not able to and for how treatment decisions would apply to him as his current personality states he just wants to die Believe the patient has severe limitation compare information and understand/in further consequences of his choices.      Data Unavailable  Clinical Impression   None     Medications Administered in the ED   aspirin chewable tablet 324 mg (has no administration in time range)        Current Discharge Medication List        CONTINUE these medications - NO CHANGES were made during your visit.        Details   albuterol sulfate 90 mcg/actuation oral inhaler  Commonly known as: PROVENTIL or VENTOLIN or PROAIR   1-2 Puffs, Inhalation, EVERY 6 HOURS PRN  Refills: 0     amLODIPine 10 mg Tablet  Commonly known as: NORVASC   10 mg, Oral, DAILY  Refills: 0     aspirin 81 mg Tablet, Delayed Release (E.C.)  Commonly known as: ECOTRIN   81 mg, Oral, DAILY  Refills: 0     ATORVASTATIN ORAL   1 Tablet, Oral,  DAILY  Refills: 0     azithromycin 500 mg Tablet  Commonly known as: ZITHROMAX   500 mg, Oral, EVERY 24 HOURS  Qty: 6 Tablet  Refills: 0     bethanechol chloride 10 mg Tablet  Commonly known as: URECHOLINE   5 mg, Oral, DAILY  Refills: 0     clopidogreL 75 mg Tablet  Commonly known as: PLAVIX   75 mg, Oral, DAILY  Refills: 0     COMBIVENT INHL   Inhalation  Refills: 0     cyclobenzaprine 10 mg Tablet  Commonly known as: FLEXERIL   10 mg, Oral, 2 TIMES DAILY  Refills: 0     DULoxetine 30 mg Capsule, Delayed Release(E.C.)  Commonly known as: CYMBALTA DR   30 mg, Oral, DAILY  Refills: 0     ergocalciferol (vitamin D2) 1,250 mcg (50,000 unit) Capsule  Commonly known as: DRISDOL   50,000 Units, Oral, EVERY 7 DAYS  Refills: 0     furosemide 20 mg Tablet  Commonly  known as: LASIX   20 mg, Oral, DAILY  Refills: 0     gabapentin 400 mg Capsule  Commonly known as: NEURONTIN   400 mg, Oral, 3 TIMES DAILY  Refills: 0     HUMALOG KWIKPEN INSULIN SUBQ   Subcutaneous, 7-20 units tid before meals  Refills: 0     hydrOXYzine pamoate 25 mg Capsule  Commonly known as: VISTARIL   50 mg, Oral, DAILY  Refills: 0     ketorolac tromethamine 10 mg Tablet  Commonly known as: TORADOL   10 mg, Oral, EVERY 6 HOURS PRN  Qty: 15 Tablet  Refills: 0     Levemir FlexPen 100 unit/mL (3 mL) Insulin Pen  Generic drug: insulin detemir U-100   60 Units, Subcutaneous, DAILY, 60 units daily  Refills: 0     lisinopriL 20 mg Tablet  Commonly known as: PRINIVIL   20 mg, Oral, DAILY  Refills: 0     meclizine 25 mg Tablet  Commonly known as: ANTIVERT   25 mg, Oral, 3 TIMES DAILY  Qty: 30 Tablet  Refills: 0     MetFORMIN 1,000 mg Tablet  Commonly known as: GLUCOPHAGE   1,000 mg, Oral, 2 TIMES DAILY WITH FOOD  Refills: 0     metoclopramide 100 mcg/mL Solution  Commonly known as: REGLAN   Oral, 3 TIMES DAILY BEFORE MEALS, 10/13ml - take one teaspoonful 63m by mouth  Refills: 0     metoprolol succinate 50 mg Tablet Sustained Release 24 hr  Commonly known as:  TOPROL-XL   50 mg, Oral, DAILY  Refills: 0     omeprazole 40 mg Capsule, Delayed Release(E.C.)  Commonly known as: PRILOSEC   40 mg, Oral, DAILY  Refills: 0     ondansetron 4 mg Tablet, Rapid Dissolve  Commonly known as: ZOFRAN ODT   4 mg, Oral, EVERY 8 HOURS PRN  Qty: 12 Tablet  Refills: 0     rOPINIRole 1 mg Tablet  Commonly known as: REQUIP   3 mg, Oral, NIGHTLY  Refills: 0     tiZANidine 4 mg Tablet  Commonly known as: ZANAFLEX   4 mg, Oral, 3 TIMES DAILY  Qty: 20 Tablet  Refills: 0     traMADoL 50 mg Tablet  Commonly known as: ULTRAM   50 mg, Oral, EVERY 6 HOURS PRN  Qty: 20 Tablet  Refills: 0     traZODone 100 mg Tablet  Commonly known as: DESYREL   100 mg, Oral, NIGHTLY  Refills: 0     Trulicity 0.75 mg/0.5 mL Pen Injector  Generic drug: dulaglutide   0.75 mg, Subcutaneous, EVERY 7 DAYS  Refills: 0

## 2023-01-03 ENCOUNTER — Encounter (HOSPITAL_BASED_OUTPATIENT_CLINIC_OR_DEPARTMENT_OTHER): Payer: Self-pay

## 2023-01-03 DIAGNOSIS — R9431 Abnormal electrocardiogram [ECG] [EKG]: Secondary | ICD-10-CM

## 2023-01-03 DIAGNOSIS — R000 Tachycardia, unspecified: Secondary | ICD-10-CM

## 2023-01-03 LAB — COMPREHENSIVE METABOLIC PANEL, NON-FASTING
ALBUMIN/GLOBULIN RATIO: 0.7 — ABNORMAL LOW (ref 0.8–1.4)
ALBUMIN: 2.7 g/dL — ABNORMAL LOW (ref 3.4–5.0)
ALKALINE PHOSPHATASE: 97 U/L (ref 46–116)
ALT (SGPT): 45 U/L (ref <=78)
ANION GAP: 8 mmol/L (ref 4–13)
AST (SGOT): 35 U/L (ref 15–37)
BILIRUBIN TOTAL: 0.3 mg/dL (ref 0.2–1.0)
BUN/CREA RATIO: 9
BUN: 9 mg/dL (ref 7–18)
CALCIUM, CORRECTED: 9.8 mg/dL
CALCIUM: 8.8 mg/dL (ref 8.5–10.1)
CHLORIDE: 105 mmol/L (ref 98–107)
CO2 TOTAL: 28 mmol/L (ref 21–32)
CREATININE: 1.02 mg/dL (ref 0.55–1.02)
ESTIMATED GFR: 63 mL/min/{1.73_m2} (ref >59)
GLOBULIN: 4.1
GLUCOSE: 75 mg/dL (ref 74–106)
OSMOLALITY, CALCULATED: 279 mosm/kg (ref 270–290)
POTASSIUM: 3.6 mmol/L (ref 3.5–5.1)
PROTEIN TOTAL: 6.8 g/dL (ref 6.4–8.2)
SODIUM: 141 mmol/L (ref 136–145)

## 2023-01-03 LAB — TROPONIN-I
TROPONIN I: 3 ng/L (ref <15)
TROPONIN I: 3 ng/L (ref <15)

## 2023-01-03 LAB — ECG 12 LEAD
Atrial Rate: 107 {beats}/min
Calculated P Axis: 56 degrees
Calculated R Axis: -4 degrees
Calculated T Axis: 43 degrees
PR Interval: 160 ms
QRS Duration: 72 ms
QT Interval: 330 ms
QTC Calculation: 440 ms
Ventricular rate: 107 {beats}/min

## 2023-01-03 NOTE — Discharge Instructions (Signed)
Your evaluation today did not reveal critical condition requiring immediate hospitalization.  Follow up primary care and Cardiology ideally within next 3-5 days.  Return to emergency department you have worsening chest pain shortness breath or any other concerning symptoms.  Thank you for visiting [facility]

## 2023-01-03 NOTE — ED Nurses Note (Signed)
Verbalized understanding of discharge instructions and follow up information. AVS given to patient.  VSS.  Left ER with no further complaints.

## 2023-01-10 ENCOUNTER — Encounter (INDEPENDENT_AMBULATORY_CARE_PROVIDER_SITE_OTHER): Payer: Self-pay | Admitting: Surgery

## 2023-01-15 NOTE — H&P (View-Only) (Signed)
 GENERAL SURGERY, [facility] GENERAL SURGERY  [address]  [place] [address]    History and Physical    Name: Martha White MRN:  [id number]   Date: 01/16/2023 DOB:  [birth year] (57 y.o.)                  Reason for Visit: Port Placement    History of Present Illness  Martha White presents today for evaluation of potential port placement secondary to limited IV access.  No prior port placement in the past.  She has although had multiple central lines placed at any time she has had to have any type of procedure.  Difficulties with any attempts at phlebotomy as well.        MEDICAL DECISION:      Review of prior external note(s) from each unique source:  Patients referral to this office including a recent assessment by the referring provider.  This was reviewed by me for this unique office visit for the indication and intent of the referral as well as any pertinent medical or surgical history relevant to the patients independent evaluation by me today.        Patient Data  Patient History  Past Medical History:   Diagnosis Date    Arthropathy     Asthma     Carpal tunnel syndrome     Carpal tunnel syndrome     Congestive heart failure (CMS HCC)     COPD (chronic obstructive pulmonary disease) (CMS HCC)     Diabetes mellitus, type 2 (CMS HCC)     Fibromyalgia     Gastroparesis     Headache     HTN (hypertension)     Lumbar herniated disc     Neuropathy (CMS HCC)     Restless legs syndrome (RLS)     Thyroid disease     TIA (transient ischemic attack)     TIA (transient ischemic attack)          Past Surgical History:   Procedure Laterality Date    HX CHOLECYSTECTOMY      HX HERNIA REPAIR      HX HERNIA REPAIR      HX HYSTERECTOMY      HX ROTATOR CUFF REPAIR      STOMACH SURGERY      X4         Current Outpatient Medications   Medication Sig    albuterol sulfate (PROVENTIL OR VENTOLIN OR PROAIR) 90 mcg/actuation Inhalation oral inhaler Take 1-2 Puffs by inhalation Every 6 hours as needed     amLODIPine (NORVASC) 10 mg Oral Tablet Take 1 Tablet (10 mg total) by mouth Once a day    aspirin (ECOTRIN) 81 mg Oral Tablet, Delayed Release (E.C.) Take 1 Tablet (81 mg total) by mouth Once a day    atorvastatin calcium (ATORVASTATIN ORAL) Take 1 Tablet by mouth Once a day    azithromycin (ZITHROMAX) 500 mg Oral Tablet Take 1 Tablet (500 mg total) by mouth Every 24 hours    bethanechol chloride (URECHOLINE) 10 mg Oral Tablet Take 0.5 Tablets (5 mg total) by mouth Once a day    clobetasoL (TEMOVATE) 0.05 % Ointment APPLY TO THE AFFECTED AREA(S) TWICE DAILY FOR TWO WEEKS; STOP FOR two WEEKS THEN USE AS NEEDED FOR itch. do not USE ON face, groin, OR long term.    clopidogreL (PLAVIX) 75 mg Oral Tablet Take 1 Tablet (75 mg total) by mouth Once a day  cyanocobalamin (VITAMIN B12) 1,000 mcg/mL Injection Solution inject 1ml SUBCUTANEOUSLY EVERY A MONTH    cyclobenzaprine (FLEXERIL) 10 mg Oral Tablet Take 1 Tablet (10 mg total) by mouth Twice daily    diphenhydrAMINE (BANOPHEN) 50 mg Oral Capsule TAKE 1 CAPSULE BY MOUTH EVERY 6 HOURS AS NEEDED FOR UP TO 7 DAYS (GENERIC FOR BENADRYL)    divalproex (DEPAKOTE ER) 250 mg Oral Tablet Sustained Release 24 hr Take 1 Tablet (250 mg total) by mouth    docusate sodium (COLACE) 100 mg Oral Capsule 100 mg by oral route.    dulaglutide (TRULICITY) 0.75 mg/0.5 mL Subcutaneous Pen Injector Inject 0.5 mL (0.75 mg total) under the skin Every 7 days    DULoxetine (CYMBALTA DR) 30 mg Oral Capsule, Delayed Release(E.C.) Take 1 Capsule (30 mg total) by mouth Once a day    ergocalciferol, vitamin D2, (DRISDOL) 1,250 mcg (50,000 unit) Oral Capsule Take 1 Capsule (50,000 Units total) by mouth Every 7 days    famotidine (PEPCID) 20 mg Oral Tablet Take 1 Tablet (20 mg total) by mouth    furosemide (LASIX) 20 mg Oral Tablet Take 1 Tablet (20 mg total) by mouth Once a day    gabapentin (NEURONTIN) 400 mg Oral Capsule Take 1 Capsule (400 mg total) by mouth Three times a day    hydrOXYzine pamoate  (VISTARIL) 25 mg Oral Capsule Take 2 Capsules (50 mg total) by mouth Once a day    insulin detemir U-100 (LEVEMIR FLEXPEN) 100 unit/mL (3 mL) Subcutaneous Insulin Pen Inject 60 Units under the skin Once a day 60 units daily    insulin lispro (HUMALOG KWIKPEN INSULIN SUBQ) Inject under the skin 7-20 units tid before meals    ipratropium/albuterol sulfate (COMBIVENT INHL) Take by inhalation    ketorolac tromethamine (TORADOL) 10 mg Oral Tablet Take 1 Tablet (10 mg total) by mouth Every 6 hours as needed for Pain    lisinopriL (PRINIVIL) 20 mg Oral Tablet Take 1 Tablet (20 mg total) by mouth Once a day    loratadine (CLARITIN) 10 mg Oral Tablet Take 1 Tablet (10 mg total) by mouth    MetFORMIN (GLUCOPHAGE) 1,000 mg Oral Tablet Take 1 Tablet (1,000 mg total) by mouth Twice daily with food    metoclopramide (REGLAN) 100 mcg/mL Oral Solution Take by mouth Three times daily before meals 10/44ml - take one teaspoonful 34m by mouth    metoprolol succinate (TOPROL-XL) 50 mg Oral Tablet Sustained Release 24 hr Take 1 Tablet (50 mg total) by mouth Once a day    omeprazole (PRILOSEC) 40 mg Oral Capsule, Delayed Release(E.C.) Take 1 Capsule (40 mg total) by mouth Once a day    ondansetron (ZOFRAN ODT) 4 mg Oral Tablet, Rapid Dissolve Take 1 Tablet (4 mg total) by mouth Every 8 hours as needed for Nausea/Vomiting    rOPINIRole (REQUIP) 1 mg Oral Tablet Take 3 Tablets (3 mg total) by mouth Every night    tiZANidine (ZANAFLEX) 4 mg Oral Tablet Take 1 Tablet (4 mg total) by mouth Three times a day    traMADoL (ULTRAM) 50 mg Oral Tablet Take 1 Tablet (50 mg total) by mouth Every 6 hours as needed for Pain    traZODone (DESYREL) 100 mg Oral Tablet Take 1 Tablet (100 mg total) by mouth Every night    verapamiL (CALAN) 40 mg Oral Tablet Take 1 Tablet (40 mg total) by mouth     Allergies   Allergen Reactions    Alcohol Shortness of Breath  Etoh, not rubbing alcohol      Ethyl Alcohol Shortness of Breath    Other Swelling     Pneumonia  and Flu vaccines    Sulfa (Sulfonamides) Anaphylaxis    Ibuprofen     Influenza Virus Vaccine, Specific Hives/ Urticaria    Penicillins Hives/ Urticaria    Pneumococcal Vaccine Hives/ Urticaria     Family Medical History:    None         Social History     Tobacco Use    Smoking status: Never    Smokeless tobacco: Never   Vaping Use    Vaping status: Never Used   Substance Use Topics    Alcohol use: Never    Drug use: Never            Physical Examination:  Vitals:    01/16/23 1017   BP: (!) 137/92   Pulse: 95   Temp: 36.7 C (98 F)   SpO2: 99%   Weight: 73.9 kg (163 lb)   Height: 1.499 m (4\' 11" )   BMI: 32.99      General: appropriate for age. in no acute distress.    Vital signs are present above and have been reviewed by me     HEENT: Atraumatic, Normocephalic.    Lungs: Nonlabored breathing with symmetric expansion    Heart:Regular wth respect to rate.    Abdomen:Soft. Nontender. Nondistended     Psychiatric: Alert and oriented to person, place, and time. affect appropriate      Assessment and Plan    ICD-10-CM    1. Poor venous access  I87.8             Discussed risks and benefits of port placement with the patient.  Risks include pneumothorax, hemothorax, potential need for additional procedures, infection that may necessitate removal of the port.  Detailed informed consent was creasted for this procedure, reviewed in detail by the patient and signed/scanned into this chart.           I appreciate the opportunity to be involved in the care of your patients.  If you have any questions or concerns regarding this encounter, please do not hesitate to contact me at your convenience.      Maura Crandall MD MBA CPE FACS     This note may have been partially generated using MModal Fluency Direct system, and there may be some incorrect words, spellings, and punctuation that were not noted in checking the note before saving, though effort was made to avoid such errors.

## 2023-01-15 NOTE — H&P (Signed)
GENERAL SURGERY, [facility] GENERAL SURGERY  [address]  [place] [address]    History and Physical    Name: Martha White MRN:  [id number]   Date: 01/16/2023 DOB:  [birth year] (57 y.o.)                  Reason for Visit: Port Placement    History of Present Illness  Martha White presents today for evaluation of potential port placement secondary to limited IV access.  No prior port placement in the past.  She has although had multiple central lines placed at any time she has had to have any type of procedure.  Difficulties with any attempts at phlebotomy as well.        MEDICAL DECISION:      Review of prior external note(s) from each unique source:  Patients referral to this office including a recent assessment by the referring provider.  This was reviewed by me for this unique office visit for the indication and intent of the referral as well as any pertinent medical or surgical history relevant to the patients independent evaluation by me today.        Patient Data  Patient History  Past Medical History:   Diagnosis Date    Arthropathy     Asthma     Carpal tunnel syndrome     Carpal tunnel syndrome     Congestive heart failure (CMS HCC)     COPD (chronic obstructive pulmonary disease) (CMS HCC)     Diabetes mellitus, type 2 (CMS HCC)     Fibromyalgia     Gastroparesis     Headache     HTN (hypertension)     Lumbar herniated disc     Neuropathy (CMS HCC)     Restless legs syndrome (RLS)     Thyroid disease     TIA (transient ischemic attack)     TIA (transient ischemic attack)          Past Surgical History:   Procedure Laterality Date    HX CHOLECYSTECTOMY      HX HERNIA REPAIR      HX HERNIA REPAIR      HX HYSTERECTOMY      HX ROTATOR CUFF REPAIR      STOMACH SURGERY      X4         Current Outpatient Medications   Medication Sig    albuterol sulfate (PROVENTIL OR VENTOLIN OR PROAIR) 90 mcg/actuation Inhalation oral inhaler Take 1-2 Puffs by inhalation Every 6 hours as needed     amLODIPine (NORVASC) 10 mg Oral Tablet Take 1 Tablet (10 mg total) by mouth Once a day    aspirin (ECOTRIN) 81 mg Oral Tablet, Delayed Release (E.C.) Take 1 Tablet (81 mg total) by mouth Once a day    atorvastatin calcium (ATORVASTATIN ORAL) Take 1 Tablet by mouth Once a day    azithromycin (ZITHROMAX) 500 mg Oral Tablet Take 1 Tablet (500 mg total) by mouth Every 24 hours    bethanechol chloride (URECHOLINE) 10 mg Oral Tablet Take 0.5 Tablets (5 mg total) by mouth Once a day    clobetasoL (TEMOVATE) 0.05 % Ointment APPLY TO THE AFFECTED AREA(S) TWICE DAILY FOR TWO WEEKS; STOP FOR two WEEKS THEN USE AS NEEDED FOR itch. do not USE ON face, groin, OR long term.    clopidogreL (PLAVIX) 75 mg Oral Tablet Take 1 Tablet (75 mg total) by mouth Once a day  cyanocobalamin (VITAMIN B12) 1,000 mcg/mL Injection Solution inject 1ml SUBCUTANEOUSLY EVERY A MONTH    cyclobenzaprine (FLEXERIL) 10 mg Oral Tablet Take 1 Tablet (10 mg total) by mouth Twice daily    diphenhydrAMINE (BANOPHEN) 50 mg Oral Capsule TAKE 1 CAPSULE BY MOUTH EVERY 6 HOURS AS NEEDED FOR UP TO 7 DAYS (GENERIC FOR BENADRYL)    divalproex (DEPAKOTE ER) 250 mg Oral Tablet Sustained Release 24 hr Take 1 Tablet (250 mg total) by mouth    docusate sodium (COLACE) 100 mg Oral Capsule 100 mg by oral route.    dulaglutide (TRULICITY) 0.75 mg/0.5 mL Subcutaneous Pen Injector Inject 0.5 mL (0.75 mg total) under the skin Every 7 days    DULoxetine (CYMBALTA DR) 30 mg Oral Capsule, Delayed Release(E.C.) Take 1 Capsule (30 mg total) by mouth Once a day    ergocalciferol, vitamin D2, (DRISDOL) 1,250 mcg (50,000 unit) Oral Capsule Take 1 Capsule (50,000 Units total) by mouth Every 7 days    famotidine (PEPCID) 20 mg Oral Tablet Take 1 Tablet (20 mg total) by mouth    furosemide (LASIX) 20 mg Oral Tablet Take 1 Tablet (20 mg total) by mouth Once a day    gabapentin (NEURONTIN) 400 mg Oral Capsule Take 1 Capsule (400 mg total) by mouth Three times a day    hydrOXYzine pamoate  (VISTARIL) 25 mg Oral Capsule Take 2 Capsules (50 mg total) by mouth Once a day    insulin detemir U-100 (LEVEMIR FLEXPEN) 100 unit/mL (3 mL) Subcutaneous Insulin Pen Inject 60 Units under the skin Once a day 60 units daily    insulin lispro (HUMALOG KWIKPEN INSULIN SUBQ) Inject under the skin 7-20 units tid before meals    ipratropium/albuterol sulfate (COMBIVENT INHL) Take by inhalation    ketorolac tromethamine (TORADOL) 10 mg Oral Tablet Take 1 Tablet (10 mg total) by mouth Every 6 hours as needed for Pain    lisinopriL (PRINIVIL) 20 mg Oral Tablet Take 1 Tablet (20 mg total) by mouth Once a day    loratadine (CLARITIN) 10 mg Oral Tablet Take 1 Tablet (10 mg total) by mouth    MetFORMIN (GLUCOPHAGE) 1,000 mg Oral Tablet Take 1 Tablet (1,000 mg total) by mouth Twice daily with food    metoclopramide (REGLAN) 100 mcg/mL Oral Solution Take by mouth Three times daily before meals 10/44ml - take one teaspoonful 34m by mouth    metoprolol succinate (TOPROL-XL) 50 mg Oral Tablet Sustained Release 24 hr Take 1 Tablet (50 mg total) by mouth Once a day    omeprazole (PRILOSEC) 40 mg Oral Capsule, Delayed Release(E.C.) Take 1 Capsule (40 mg total) by mouth Once a day    ondansetron (ZOFRAN ODT) 4 mg Oral Tablet, Rapid Dissolve Take 1 Tablet (4 mg total) by mouth Every 8 hours as needed for Nausea/Vomiting    rOPINIRole (REQUIP) 1 mg Oral Tablet Take 3 Tablets (3 mg total) by mouth Every night    tiZANidine (ZANAFLEX) 4 mg Oral Tablet Take 1 Tablet (4 mg total) by mouth Three times a day    traMADoL (ULTRAM) 50 mg Oral Tablet Take 1 Tablet (50 mg total) by mouth Every 6 hours as needed for Pain    traZODone (DESYREL) 100 mg Oral Tablet Take 1 Tablet (100 mg total) by mouth Every night    verapamiL (CALAN) 40 mg Oral Tablet Take 1 Tablet (40 mg total) by mouth     Allergies   Allergen Reactions    Alcohol Shortness of Breath  Etoh, not rubbing alcohol      Ethyl Alcohol Shortness of Breath    Other Swelling     Pneumonia  and Flu vaccines    Sulfa (Sulfonamides) Anaphylaxis    Ibuprofen     Influenza Virus Vaccine, Specific Hives/ Urticaria    Penicillins Hives/ Urticaria    Pneumococcal Vaccine Hives/ Urticaria     Family Medical History:    None         Social History     Tobacco Use    Smoking status: Never    Smokeless tobacco: Never   Vaping Use    Vaping status: Never Used   Substance Use Topics    Alcohol use: Never    Drug use: Never            Physical Examination:  Vitals:    01/16/23 1017   BP: (!) 137/92   Pulse: 95   Temp: 36.7 C (98 F)   SpO2: 99%   Weight: 73.9 kg (163 lb)   Height: 1.499 m (4\' 11" )   BMI: 32.99      General: appropriate for age. in no acute distress.    Vital signs are present above and have been reviewed by me     HEENT: Atraumatic, Normocephalic.    Lungs: Nonlabored breathing with symmetric expansion    Heart:Regular wth respect to rate.    Abdomen:Soft. Nontender. Nondistended     Psychiatric: Alert and oriented to person, place, and time. affect appropriate      Assessment and Plan    ICD-10-CM    1. Poor venous access  I87.8             Discussed risks and benefits of port placement with the patient.  Risks include pneumothorax, hemothorax, potential need for additional procedures, infection that may necessitate removal of the port.  Detailed informed consent was creasted for this procedure, reviewed in detail by the patient and signed/scanned into this chart.           I appreciate the opportunity to be involved in the care of your patients.  If you have any questions or concerns regarding this encounter, please do not hesitate to contact me at your convenience.      Maura Crandall MD MBA CPE FACS     This note may have been partially generated using MModal Fluency Direct system, and there may be some incorrect words, spellings, and punctuation that were not noted in checking the note before saving, though effort was made to avoid such errors.

## 2023-01-16 ENCOUNTER — Ambulatory Visit (INDEPENDENT_AMBULATORY_CARE_PROVIDER_SITE_OTHER): Payer: Medicare (Managed Care) | Admitting: Surgery

## 2023-01-16 ENCOUNTER — Encounter (INDEPENDENT_AMBULATORY_CARE_PROVIDER_SITE_OTHER): Payer: Self-pay | Admitting: Surgery

## 2023-01-16 ENCOUNTER — Other Ambulatory Visit: Payer: Self-pay

## 2023-01-16 VITALS — BP 137/92 | HR 95 | Temp 98.0°F | Ht 59.0 in | Wt 163.0 lb

## 2023-01-16 DIAGNOSIS — I878 Other specified disorders of veins: Secondary | ICD-10-CM

## 2023-01-17 ENCOUNTER — Encounter (INDEPENDENT_AMBULATORY_CARE_PROVIDER_SITE_OTHER): Payer: Self-pay | Admitting: Surgery

## 2023-01-28 ENCOUNTER — Emergency Department
Admission: EM | Admit: 2023-01-28 | Discharge: 2023-01-28 | Disposition: A | Discharge status: Home / Self Care | Payer: Medicare (Managed Care) | Attending: Emergency Medicine | Admitting: Emergency Medicine

## 2023-01-28 ENCOUNTER — Other Ambulatory Visit: Payer: Self-pay

## 2023-01-28 ENCOUNTER — Encounter (HOSPITAL_BASED_OUTPATIENT_CLINIC_OR_DEPARTMENT_OTHER): Payer: Self-pay

## 2023-01-28 DIAGNOSIS — J069 Acute upper respiratory infection, unspecified: Secondary | ICD-10-CM | POA: Insufficient documentation

## 2023-01-28 DIAGNOSIS — R197 Diarrhea, unspecified: Secondary | ICD-10-CM | POA: Insufficient documentation

## 2023-01-28 DIAGNOSIS — J40 Bronchitis, not specified as acute or chronic: Secondary | ICD-10-CM | POA: Insufficient documentation

## 2023-01-28 DIAGNOSIS — R112 Nausea with vomiting, unspecified: Secondary | ICD-10-CM | POA: Insufficient documentation

## 2023-01-28 DIAGNOSIS — Z1152 Encounter for screening for COVID-19: Secondary | ICD-10-CM | POA: Insufficient documentation

## 2023-01-28 LAB — COVID-19, FLU A/B, RSV RAPID BY PCR
INFLUENZA VIRUS TYPE A: NOT DETECTED
INFLUENZA VIRUS TYPE B: NOT DETECTED
RESPIRATORY SYNCTIAL VIRUS (RSV): NOT DETECTED
SARS-CoV-2: NOT DETECTED

## 2023-01-28 MED ORDER — ONDANSETRON 4 MG DISINTEGRATING TABLET
4.0000 mg | ORAL_TABLET | ORAL | Status: AC
Start: 2023-01-28 — End: 2023-01-28
  Administered 2023-01-28: 4 mg via ORAL

## 2023-01-28 MED ORDER — AZITHROMYCIN 250 MG TABLET
500.0000 mg | ORAL_TABLET | ORAL | Status: DC
Start: 2023-01-28 — End: 2023-01-28
  Administered 2023-01-28: 0 mg via ORAL

## 2023-01-28 MED ORDER — ONDANSETRON 4 MG DISINTEGRATING TABLET
4.0000 mg | ORAL_TABLET | Freq: Three times a day (TID) | ORAL | 0 refills | Status: DC | PRN
Start: 2023-01-28 — End: 2023-04-27

## 2023-01-28 MED ORDER — ONDANSETRON 4 MG DISINTEGRATING TABLET
ORAL_TABLET | ORAL | Status: AC
Start: 2023-01-28 — End: 2023-01-28
  Filled 2023-01-28: qty 1

## 2023-01-28 MED ORDER — AZITHROMYCIN 500 MG TABLET
500.0000 mg | ORAL_TABLET | ORAL | 0 refills | Status: DC
Start: 2023-01-28 — End: 2023-12-10

## 2023-01-28 NOTE — ED Provider Notes (Signed)
[facility], [facility] - Emergency Department  ED Primary Provider Note  History of Present Illness   Chief Complaint   Patient presents with    Exposure To Coronavirus     Martha White is a 57 y.o. female who had concerns including Exposure To Coronavirus.  Arrival: The patient arrived by Car complaining nasal congestion with postnasal drip along with coughing nonproductive over the past several days.  Patient states her husband was diagnosed with COVID on Wednesday.  She started with symptoms yesterday in his gotten progressively worse.  She has had aches and pains all over.  She states having fever and chills.  She started to vomit yesterday in his vomited 6 times.  She has had continuous diarrhea since last night.  She denies any abdominal pain.  No dysuria or increased frequency.    HPI  Review of Systems   Review of Systems   Constitutional:  Positive for activity change, appetite change, chills and fever.   HENT:  Positive for congestion, postnasal drip and rhinorrhea. Negative for ear pain and sore throat.    Eyes:  Negative for pain and visual disturbance.   Respiratory:  Negative for cough and shortness of breath.    Cardiovascular:  Negative for chest pain and palpitations.   Gastrointestinal:  Positive for diarrhea, nausea and vomiting. Negative for abdominal pain.   Genitourinary:  Negative for dysuria and hematuria.   Musculoskeletal:  Positive for arthralgias and myalgias. Negative for back pain.   Skin:  Negative for color change and rash.   Neurological:  Negative for seizures and syncope.   All other systems reviewed and are negative.     Historical Data   History Reviewed This Encounter:     Physical Exam   ED Triage Vitals [01/28/23 1115]   BP (Non-Invasive) 136/85   Heart Rate 95   Respiratory Rate 20   Temperature (!) 35.9 C (96.6 F)   SpO2 100 %   Weight 65.8 kg (145 lb)   Height 1.499 m (4\' 11" )     Physical Exam  Vitals and nursing note reviewed.   Constitutional:        General: She is not in acute distress.     Appearance: Normal appearance. She is well-developed and normal weight.   HENT:      Head: Normocephalic and atraumatic.      Right Ear: External ear normal.      Left Ear: External ear normal.      Nose: Congestion and rhinorrhea present.      Mouth/Throat:      Mouth: Mucous membranes are dry.   Eyes:      Extraocular Movements: Extraocular movements intact.      Conjunctiva/sclera: Conjunctivae normal.      Pupils: Pupils are equal, round, and reactive to light.   Cardiovascular:      Rate and Rhythm: Normal rate and regular rhythm.      Pulses: Normal pulses.      Heart sounds: Normal heart sounds. No murmur heard.  Pulmonary:      Effort: Pulmonary effort is normal. No respiratory distress.      Breath sounds: Normal breath sounds.   Abdominal:      Palpations: Abdomen is soft.      Tenderness: There is no abdominal tenderness.      Comments: Hyperactive bowel sounds throughout the abdomen.   Musculoskeletal:         General: No swelling. Normal range of motion.  Cervical back: Normal range of motion and neck supple.   Skin:     General: Skin is warm and dry.      Capillary Refill: Capillary refill takes less than 2 seconds.   Neurological:      General: No focal deficit present.      Mental Status: She is alert and oriented to person, place, and time.   Psychiatric:         Mood and Affect: Mood normal.         Behavior: Behavior normal.         Thought Content: Thought content normal.       Patient Data     Labs Ordered/Reviewed   COVID-19, FLU A/B, RSV RAPID BY PCR - Normal    Narrative:     Results are for the simultaneous qualitative identification of SARS-CoV-2 (formerly 2019-nCoV), Influenza A, Influenza B, and RSV RNA. These etiologic agents are generally detectable in nasopharyngeal and nasal swabs during the ACUTE PHASE of infection. Hence, this test is intended to be performed on respiratory specimens collected from individuals with signs and symptoms  of upper respiratory tract infection who meet Centers for Disease Control and Prevention (CDC) clinical and/or epidemiological criteria for Coronavirus Disease 2019 (COVID-19) testing. CDC COVID-19 criteria for testing on human specimens is available at [facility] webpage information for Healthcare Professionals: Coronavirus Disease 2019 (COVID-19) ([employer]).     False-negative results may occur if the virus has genomic mutations, insertions, deletions, or rearrangements or if performed very early in the course of illness. Otherwise, negative results indicate virus specific RNA targets are not detected, however negative results do not preclude SARS-CoV-2 infection/COVID-19, Influenza, or Respiratory syncytial virus infection. Results should not be used as the sole basis for patient management decisions. Negative results must be combined with clinical observations, patient history, and epidemiological information. If upper respiratory tract infection is still suspected based on exposure history together with other clinical findings, re-testing should be considered.    Test methodology:   Cepheid Xpert Xpress SARS-CoV-2/Flu/RSV Assay real-time polymerase chain reaction (RT-PCR) test on the GeneXpert Dx and Xpert Xpress systems.     No orders to display     Medical Decision Making        Medical Decision Making  Patient is 57 year old black female complaining of nasal congestion with postnasal drip along with cough nonproductive since Friday.  She states ever since then she has been having aches and pains all over as well as fever and chills.  Patient started having nausea and vomiting last night.  She also started having diarrhea last night.  Patient's husband was diagnosed with COVID Wednesday.  Patient will have a for Plex done as well as be given Zofran for the nausea.  Patient will then be treated for results and discharged home.  Patient will follow up with her PMD in  the next 3-4 days.    Risk  Prescription drug management.             Medications Administered in the ED   azithromycin [facility]) tablet (has no administration in time range)   ondansetron (ZOFRAN ODT) rapid dissolve tablet (4 mg Oral Given 01/28/23 1140)     Clinical Impression   Nausea vomiting and diarrhea (Primary)   Upper respiratory tract infection, unspecified type   Bronchitis       Disposition: Discharged               Clinical Impression   Nausea vomiting and  diarrhea (Primary)   Upper respiratory tract infection, unspecified type   Bronchitis       Current Discharge Medication List

## 2023-01-28 NOTE — ED Nurses Note (Signed)

## 2023-01-28 NOTE — ED Nurses Note (Signed)
Patient reports flu like symptoms including shortness of breath, nausea, aches, fever, headache, and some diarrhea ongoing for the last few days. States that she has been exposed to COVID by her husband.

## 2023-01-28 NOTE — ED Triage Notes (Signed)
Martha White had covid last Wednesday and I feel worse than him.  Nausea, body aches, headache, throwing up and diarrhea. Ongoing for 2 days.

## 2023-01-29 ENCOUNTER — Encounter (HOSPITAL_COMMUNITY): Payer: Medicare (Managed Care) | Admitting: Surgery

## 2023-01-29 ENCOUNTER — Other Ambulatory Visit (HOSPITAL_COMMUNITY): Payer: Medicare (Managed Care)

## 2023-01-29 ENCOUNTER — Encounter (HOSPITAL_COMMUNITY)
Admission: RE | Disposition: A | Discharge status: Home / Self Care | Payer: Self-pay | Source: Ambulatory Visit | Attending: Surgery

## 2023-01-29 ENCOUNTER — Encounter (HOSPITAL_COMMUNITY): Payer: Self-pay | Admitting: Surgery

## 2023-01-29 ENCOUNTER — Ambulatory Visit (HOSPITAL_COMMUNITY): Payer: Medicare (Managed Care) | Admitting: Certified Registered"

## 2023-01-29 ENCOUNTER — Ambulatory Visit
Admission: RE | Admit: 2023-01-29 | Discharge: 2023-01-29 | Disposition: A | Discharge status: Home / Self Care | Payer: Medicare (Managed Care) | Source: Ambulatory Visit | Attending: Surgery | Admitting: Surgery

## 2023-01-29 DIAGNOSIS — I878 Other specified disorders of veins: Secondary | ICD-10-CM | POA: Insufficient documentation

## 2023-01-29 DIAGNOSIS — F419 Anxiety disorder, unspecified: Secondary | ICD-10-CM | POA: Insufficient documentation

## 2023-01-29 DIAGNOSIS — M5126 Other intervertebral disc displacement, lumbar region: Secondary | ICD-10-CM | POA: Insufficient documentation

## 2023-01-29 DIAGNOSIS — F32A Depression, unspecified: Secondary | ICD-10-CM | POA: Insufficient documentation

## 2023-01-29 DIAGNOSIS — Z7984 Long term (current) use of oral hypoglycemic drugs: Secondary | ICD-10-CM | POA: Insufficient documentation

## 2023-01-29 DIAGNOSIS — M1990 Unspecified osteoarthritis, unspecified site: Secondary | ICD-10-CM | POA: Insufficient documentation

## 2023-01-29 DIAGNOSIS — G43909 Migraine, unspecified, not intractable, without status migrainosus: Secondary | ICD-10-CM | POA: Insufficient documentation

## 2023-01-29 DIAGNOSIS — Z7902 Long term (current) use of antithrombotics/antiplatelets: Secondary | ICD-10-CM | POA: Insufficient documentation

## 2023-01-29 DIAGNOSIS — I110 Hypertensive heart disease with heart failure: Secondary | ICD-10-CM | POA: Insufficient documentation

## 2023-01-29 DIAGNOSIS — I509 Heart failure, unspecified: Secondary | ICD-10-CM | POA: Insufficient documentation

## 2023-01-29 DIAGNOSIS — G629 Polyneuropathy, unspecified: Secondary | ICD-10-CM | POA: Insufficient documentation

## 2023-01-29 DIAGNOSIS — G2581 Restless legs syndrome: Secondary | ICD-10-CM | POA: Insufficient documentation

## 2023-01-29 DIAGNOSIS — M797 Fibromyalgia: Secondary | ICD-10-CM | POA: Insufficient documentation

## 2023-01-29 DIAGNOSIS — K219 Gastro-esophageal reflux disease without esophagitis: Secondary | ICD-10-CM | POA: Insufficient documentation

## 2023-01-29 DIAGNOSIS — Z79899 Other long term (current) drug therapy: Secondary | ICD-10-CM | POA: Insufficient documentation

## 2023-01-29 DIAGNOSIS — Z794 Long term (current) use of insulin: Secondary | ICD-10-CM | POA: Insufficient documentation

## 2023-01-29 DIAGNOSIS — E119 Type 2 diabetes mellitus without complications: Secondary | ICD-10-CM | POA: Insufficient documentation

## 2023-01-29 DIAGNOSIS — J4489 Other specified chronic obstructive pulmonary disease: Secondary | ICD-10-CM | POA: Insufficient documentation

## 2023-01-29 DIAGNOSIS — M5382 Other specified dorsopathies, cervical region: Secondary | ICD-10-CM | POA: Insufficient documentation

## 2023-01-29 DIAGNOSIS — E785 Hyperlipidemia, unspecified: Secondary | ICD-10-CM | POA: Insufficient documentation

## 2023-01-29 LAB — POC BLOOD GLUCOSE (RESULTS)
GLUCOSE, POC: 63 mg/dL — ABNORMAL LOW (ref 70–100)
GLUCOSE, POC: 89 mg/dL (ref 70–100)
GLUCOSE, POC: 144 mg/dL — ABNORMAL HIGH (ref 70–100)
GLUCOSE, POC: 67 mg/dL — ABNORMAL LOW (ref 70–100)

## 2023-01-29 SURGERY — INSERTION PORT SUBCUTANEOUS
Anesthesia: Monitor Anesthesia Care | Site: Chest | Wound class: Clean Wound: Uninfected operative wounds in which no inflammation occurred

## 2023-01-29 MED ORDER — LIDOCAINE 1 %-EPINEPHRINE 1:100,000 INJECTION SOLUTION
Freq: Once | INTRAMUSCULAR | Status: DC | PRN
Start: 2023-01-29 — End: 2023-01-29
  Administered 2023-01-29: 20 mL via INTRAMUSCULAR

## 2023-01-29 MED ORDER — HEPARIN (PORCINE) 5,000 UNIT/ML INJECTION SOLUTION
Freq: Once | INTRAMUSCULAR | Status: DC | PRN
Start: 2023-01-29 — End: 2023-01-29
  Administered 2023-01-29: 5000 [IU]

## 2023-01-29 MED ORDER — LACTATED RINGERS INTRAVENOUS SOLUTION
INTRAVENOUS | Status: DC
Start: 2023-01-29 — End: 2023-01-29

## 2023-01-29 MED ORDER — DEXTROSE 50 % IN WATER (D50W) INTRAVENOUS SYRINGE
12.5000 g | INJECTION | INTRAVENOUS | Status: AC
Start: 2023-01-29 — End: 2023-01-29
  Administered 2023-01-29: 25 mL via INTRAVENOUS

## 2023-01-29 MED ORDER — SODIUM CHLORIDE 0.9 % (FLUSH) INJECTION SYRINGE
3.0000 mL | INJECTION | INTRAMUSCULAR | Status: DC | PRN
Start: 2023-01-29 — End: 2023-01-29

## 2023-01-29 MED ORDER — PROPOFOL 10 MG/ML INTRAVENOUS EMULSION
INTRAVENOUS | Status: DC | PRN
Start: 2023-01-29 — End: 2023-01-29
  Administered 2023-01-29: 0 ug/kg/min via INTRAVENOUS
  Administered 2023-01-29: 25 ug/kg/min via INTRAVENOUS
  Administered 2023-01-29: 75 ug/kg/min via INTRAVENOUS
  Administered 2023-01-29: 50 ug/kg/min via INTRAVENOUS

## 2023-01-29 MED ORDER — DEXTROSE 50 % IN WATER (D50W) INTRAVENOUS SYRINGE
INJECTION | INTRAVENOUS | Status: AC
Start: 2023-01-29 — End: 2023-01-29
  Filled 2023-01-29: qty 50

## 2023-01-29 MED ORDER — HEPARIN (PORCINE) 5,000 UNIT/ML INJECTION SOLUTION
INTRAMUSCULAR | Status: AC
Start: 2023-01-29 — End: 2023-01-29
  Filled 2023-01-29: qty 1

## 2023-01-29 MED ORDER — ACETAMINOPHEN 1,000 MG/100 ML (10 MG/ML) INTRAVENOUS SOLUTION
INTRAVENOUS | Status: AC
Start: 2023-01-29 — End: 2023-01-29
  Filled 2023-01-29: qty 100

## 2023-01-29 MED ORDER — FENTANYL (PF) 50 MCG/ML INJECTION WRAPPER
25.0000 ug | INJECTION | INTRAMUSCULAR | Status: DC | PRN
Start: 2023-01-29 — End: 2023-01-29

## 2023-01-29 MED ORDER — LIDOCAINE 1 %-EPINEPHRINE 1:100,000 INJECTION SOLUTION
INTRAMUSCULAR | Status: AC
Start: 2023-01-29 — End: 2023-01-29
  Filled 2023-01-29: qty 20

## 2023-01-29 MED ORDER — PROCHLORPERAZINE EDISYLATE 10 MG/2 ML (5 MG/ML) INJECTION SOLUTION
5.0000 mg | Freq: Once | INTRAMUSCULAR | Status: DC | PRN
Start: 2023-01-29 — End: 2023-01-29

## 2023-01-29 MED ORDER — HYDROCODONE 5 MG-ACETAMINOPHEN 325 MG TABLET
1.0000 | ORAL_TABLET | ORAL | Status: DC | PRN
Start: 2023-01-29 — End: 2023-01-29
  Administered 2023-01-29: 1 via ORAL
  Filled 2023-01-29: qty 1

## 2023-01-29 MED ORDER — ONDANSETRON HCL (PF) 4 MG/2 ML INJECTION SOLUTION
4.0000 mg | Freq: Once | INTRAMUSCULAR | Status: AC
Start: 2023-01-29 — End: 2023-01-29
  Administered 2023-01-29: 4 mg via INTRAVENOUS

## 2023-01-29 MED ORDER — FAMOTIDINE (PF) 20 MG/2 ML INTRAVENOUS SOLUTION
INTRAVENOUS | Status: AC
Start: 2023-01-29 — End: 2023-01-29
  Filled 2023-01-29: qty 2

## 2023-01-29 MED ORDER — FENTANYL (PF) 50 MCG/ML INJECTION WRAPPER
INJECTION | Freq: Once | INTRAMUSCULAR | Status: DC | PRN
Start: 2023-01-29 — End: 2023-01-29
  Administered 2023-01-29 (×2): 50 ug via INTRAVENOUS

## 2023-01-29 MED ORDER — ONDANSETRON HCL (PF) 4 MG/2 ML INJECTION SOLUTION
4.0000 mg | Freq: Four times a day (QID) | INTRAMUSCULAR | Status: DC | PRN
Start: 2023-01-29 — End: 2023-01-29

## 2023-01-29 MED ORDER — FAMOTIDINE (PF) 20 MG/2 ML INTRAVENOUS SOLUTION
20.0000 mg | Freq: Once | INTRAVENOUS | Status: AC
Start: 2023-01-29 — End: 2023-01-29
  Administered 2023-01-29: 20 mg via INTRAVENOUS

## 2023-01-29 MED ORDER — CEFAZOLIN 2 GRAM INTRAVENOUS SOLUTION
INTRAVENOUS | Status: AC
Start: 2023-01-29 — End: 2023-01-29
  Filled 2023-01-29: qty 14.71

## 2023-01-29 MED ORDER — ONDANSETRON HCL (PF) 4 MG/2 ML INJECTION SOLUTION
INTRAMUSCULAR | Status: AC
Start: 2023-01-29 — End: 2023-01-29
  Filled 2023-01-29: qty 2

## 2023-01-29 MED ORDER — MIDAZOLAM 5 MG/ML INJECTION WRAPPER
INTRAMUSCULAR | Status: AC
Start: 2023-01-29 — End: 2023-01-29
  Filled 2023-01-29: qty 1

## 2023-01-29 MED ORDER — ACETAMINOPHEN 1,000 MG/100 ML (10 MG/ML) INTRAVENOUS SOLUTION
Freq: Once | INTRAVENOUS | Status: DC | PRN
Start: 2023-01-29 — End: 2023-01-29
  Administered 2023-01-29: 1000 mg via INTRAVENOUS

## 2023-01-29 MED ORDER — ALBUTEROL SULFATE 2.5 MG/3 ML (0.083 %) SOLUTION FOR NEBULIZATION
2.5000 mg | INHALATION_SOLUTION | Freq: Once | RESPIRATORY_TRACT | Status: DC | PRN
Start: 2023-01-29 — End: 2023-01-29

## 2023-01-29 MED ORDER — MIDAZOLAM 5 MG/ML INJECTION WRAPPER
1.0000 mg | Freq: Once | INTRAMUSCULAR | Status: DC | PRN
Start: 2023-01-29 — End: 2023-01-29
  Administered 2023-01-29: 1 mg via INTRAVENOUS

## 2023-01-29 MED ORDER — SODIUM CHLORIDE 0.9 % (FLUSH) INJECTION SYRINGE
3.0000 mL | INJECTION | Freq: Three times a day (TID) | INTRAMUSCULAR | Status: DC
Start: 2023-01-29 — End: 2023-01-29

## 2023-01-29 MED ORDER — IPRATROPIUM 0.5 MG-ALBUTEROL 3 MG (2.5 MG BASE)/3 ML NEBULIZATION SOLN
3.0000 mL | INHALATION_SOLUTION | Freq: Once | RESPIRATORY_TRACT | Status: DC | PRN
Start: 2023-01-29 — End: 2023-01-29

## 2023-01-29 MED ORDER — DEXTROSE 50 % IN WATER (D50W) INTRAVENOUS SYRINGE
INJECTION | Freq: Once | INTRAVENOUS | Status: DC | PRN
Start: 2023-01-29 — End: 2023-01-29
  Administered 2023-01-29: 12.5 g via INTRAVENOUS

## 2023-01-29 MED ORDER — FENTANYL (PF) 50 MCG/ML INJECTION WRAPPER
50.0000 ug | INJECTION | INTRAMUSCULAR | Status: DC | PRN
Start: 2023-01-29 — End: 2023-01-29

## 2023-01-29 MED ORDER — FENTANYL (PF) 50 MCG/ML INJECTION SOLUTION
INTRAMUSCULAR | Status: AC
Start: 2023-01-29 — End: 2023-01-29
  Filled 2023-01-29: qty 2

## 2023-01-29 MED ORDER — ROPIVACAINE (PF) 2 MG/ML (0.2 %) INJECTION SOLUTION
INTRAMUSCULAR | Status: AC
Start: 2023-01-29 — End: 2023-01-29
  Filled 2023-01-29: qty 20

## 2023-01-29 MED ORDER — METOPROLOL SUCCINATE ER 50 MG TABLET,EXTENDED RELEASE 24 HR
ORAL_TABLET | ORAL | Status: AC
Start: 2023-01-29 — End: 2023-01-29
  Filled 2023-01-29: qty 1

## 2023-01-29 MED ORDER — METOPROLOL SUCCINATE ER 50 MG TABLET,EXTENDED RELEASE 24 HR
50.0000 mg | ORAL_TABLET | ORAL | Status: AC
Start: 2023-01-29 — End: 2023-01-29
  Administered 2023-01-29: 50 mg via ORAL

## 2023-01-29 MED ORDER — CEFAZOLIN 1 GRAM SOLUTION FOR INJECTION
Freq: Once | INTRAMUSCULAR | Status: DC | PRN
Start: 2023-01-29 — End: 2023-01-29
  Administered 2023-01-29: 2000 mg via INTRAVENOUS

## 2023-01-29 MED ORDER — ONDANSETRON HCL (PF) 4 MG/2 ML INJECTION SOLUTION
4.0000 mg | Freq: Once | INTRAMUSCULAR | Status: DC | PRN
Start: 2023-01-29 — End: 2023-01-29

## 2023-01-29 MED ORDER — PROPOFOL 10 MG/ML IV BOLUS
INJECTION | Freq: Once | INTRAVENOUS | Status: DC | PRN
Start: 2023-01-29 — End: 2023-01-29
  Administered 2023-01-29: 30 mg via INTRAVENOUS

## 2023-01-29 SURGICAL SUPPLY — 27 items
ADH LIQUID LF  WTPRF VIAL PREP NONSTAIN MASTISOL STYRAX GUM MASTIC ALC MTHY SLCYT STRL CLR NHZR 2/3 (WOUND CARE SUPPLY) ×1 IMPLANT
BLADE 15 2 END CBNSTL SURG STRL DISP (SURGICAL CUTTING SUPPLIES) ×1 IMPLANT
CONV USE ITEM 107364 - COVER STAND 54X23IN MAYO REG FBRC REINF STD TLSCP FOLD STRL (DRAPE/PACKS/SHEETS/OR TOWEL) ×1 IMPLANT
CONV USE ITEM 339062 - PACK SURG ECLIPSE ENT I STRL LF (CUSTOM TRAYS & PACK) ×1
COUNTER 20 CNT BLOCK ADH NEEDLE STRL LF  RD SHARP FOAM 15.75X11.5X14IN DISP (MED SURG SUPPLIES) ×1 IMPLANT
COVER EQP 90X60IN HVDTY BCK PAD FNFLD BLU STRL (DRAPE/PACKS/SHEETS/OR TOWEL) ×1 IMPLANT
COVER STAND 54X23IN MAYO REG FBRC REINF STD TLSCP FOLD STRL (DRAPE/PACKS/SHEETS/OR TOWEL) ×1
DCNTR FLUID DISPENSR BAG BAJ DISP STRL LF  ASPT TRANSF (IV TUBING & ACCESSORIES) ×1 IMPLANT
DETERGENT INSTR 22OZ TRNSPT GEL RINSE FREE NEUT PH PREKLENZ CLR PLSNT LF (MISCELLANEOUS PT CARE ITEMS) ×1 IMPLANT
DRAPE 76X55IN 3 QT HALYARD LF  STRL DISP SURG (DRAPE/PACKS/SHEETS/OR TOWEL) ×1 IMPLANT
DRAPE SURG KC100 LAP CHOLE REINF ARMBRD CVR HKLP TUBE HLDR FEN 104X76X120IN STD BSC STRL (DRAPE/PACKS/SHEETS/OR TOWEL) ×1 IMPLANT
DRESS TRNSPR 4.75X4.75IN CHG G_EL PAD NTCH STRP TGDRM 2 7/16 (WOUND CARE SUPPLY) ×1 IMPLANT
GLOVE SURG 5.5 LF  PF BEAD CUF STRL CRM 12IN PROTEXIS PI PLISPRN THK9.4 MIL (GLOVES AND ACCESSORIES) ×1 IMPLANT
GLOVE SURG 7 LF  PF BEAD CUF STRL CRM 11.8IN PROTEXIS PI PLISPRN THK9.1 MIL (GLOVES AND ACCESSORIES) ×1 IMPLANT
GLOVE SURG 7.5 LTX PF SMOOTH BEAD CUF STRL YW 12IN PROTEXIS (GLOVES AND ACCESSORIES) IMPLANT
GOWN SURG LRG STD LGTH L3 HKLP CLSR RGLN SLEEVE TWL STRL LF  DISP GRN AERO BLU PRFRM FBRC (DRAPE/PACKS/SHEETS/OR TOWEL) ×3 IMPLANT
LABEL MED CORRECT MED LABELING SYS 4 FLG 2 SHEET 24 PRPRNT STRL (MED SURG SUPPLIES) ×1 IMPLANT
NEEDLE HYPO  23GA 1.5IN TW PRCSNGL SS POLYPROP REG BVL LL HUB DEHP-FR TRQS STRL LF  DISP (MED SURG SUPPLIES) ×2 IMPLANT
PACK SURG ECLIPSE ENT I STRL LF (CUSTOM TRAYS & PACK) ×1 IMPLANT
PORT IMPL INFUS POWERPORT ISP MRI ARGD CHRNFLX 8FR 1 LUM ATTACH CATH INTMD KIT SIL FIL SUT PLUG STRL ×1 IMPLANT
SOL IV 0.9% NACL 1000ML STRL PRSV FR FLXB CONTAINR LF (MEDICATIONS/SOLUTIONS) ×1 IMPLANT
SPONGE GAUZE 4X4IN AV GZ CLU COTTON ABS NWVN POSTOP LF  STRL DISP (WOUND CARE SUPPLY) ×1 IMPLANT
SPONGE SURG 4X4IN 16 PLY XRY DETECT COTTON STRL LF  DISP (WOUND CARE SUPPLY) ×1 IMPLANT
STRIP 4X.5IN STRSTRP PLSTR REINF SKNCLS WHT STRL LF (WOUND CARE SUPPLY) ×1 IMPLANT
SUTURE 4-0 PS2 MONOCRYL MTPS 27IN UNDYED MONOF ABS (SUTURE/WOUND CLOSURE) ×1 IMPLANT
SYRINGE LL 10ML LF  STRL GRAD N-PYRG DEHP-FR PVC FREE MED DISP (MED SURG SUPPLIES) ×3 IMPLANT
TOWEL 24X16IN COTTON BLU DISP SURG STRL LF (DRAPE/PACKS/SHEETS/OR TOWEL) ×1 IMPLANT

## 2023-01-29 NOTE — OR Surgeon (Signed)
[facility]      Patient Name: Martha White, Martha White Number: [id number]  Date of Service: 01/29/2023   Date of Birth: 03/31/1965      Pre-Operative Diagnosis: HISTORY OF DIFFICULT VENOUS ACCESS     Post-Operative Diagnosis: HISTORY OF DIFFICULT VENOUS ACCESS    Procedure(s)/Description:  PORTACATH INSERTION: 36561 (CPT) right subclavian with radiographic supervision and interpretation of fluoroscopy    Attending Surgeon: Esmond Plants, MD     Anesthesia:  CRNA: Margaretha Glassing, CRNA    Anesthesia Type: .Monitor Anesthesia Care     Estimated Blood Loss:  <10 cc    The patient was brought to the operating suite and placed in supine position on the operating room table where anesthesia provided IV access as well as conscious sedation.  Once the patient was adequately sedated, the neck and chest were prepped and draped in usual sterile fashion in anticipation of insertion of port-A-cath.   Attention was first directed towards the patient's shoulder where the skin and subcutaneous tissues in infraclavicular position was injected with local analgesia followed by cannulation of the subclavian vein with an 18-gauge needle.  The return was nonpulsatile dark venous appearing blood.  This was  followed by passage of guide wire under fluoroscopy into the vena cava.  Once access to the venous system had been accomplished, a subcutaneous pocket was created approximately 2 cm inferior to the access point after local analgesia was accomplished.   Once the subcutaneous pocket was developed and noted to be appropriately sized for the catheter, a subcutaneous tract was developed between the guide wire and the pocket followed by passage of the catheter between the two points without difficulty.   The catheter was then connected to the hub of the port-A-cath followed by flushing of the line with heparinized saline and placement of the port-A-cath in its subcutaneous pocket.   The catheter tip was then cut to  appropriate length using fluoroscopy followed by passage of the dilator and pull-away sheath over the guide wire under fluoroscopic guidance into appropriate location.   The dilator and guide wire were then removed followed by passage of the catheter down the pull-away sheath and removal of the pull-away sheath.   The catheter was then aspirated with non pulsatile flow and flushed with ease followed by closure of the skin using subcuticular 4-0 Monocryl, both the subcutaneous pocket as well as the access point.   Fluoroscopy confirmed appropriate location of the catheter in the superior vena cava.  The catheter was aspirated and flushed with ease prior to closure.  The patient tolerated the procedure well; returned to postanesthesia care in stable condition.  Maura Crandall MD MBA CPE FACS

## 2023-01-29 NOTE — Anesthesia Postprocedure Evaluation (Signed)
Anesthesia Post Op Evaluation    Patient: Martha White  Procedure(s):  PORTACATH INSERTION    Last Vitals:Temperature: 36.1 C (97 F) (01/29/23 1049)  Heart Rate: 91 (01/29/23 1049)  BP (Non-Invasive): 130/76 (01/29/23 1049)  Respiratory Rate: 16 (01/29/23 1049)  SpO2: 100 % (01/29/23 1049)    There were no known notable events for this encounter.    Patient is sufficiently recovered from the effects of anesthesia to participate in the evaluation and has returned to their pre-procedure level.  Patient location during evaluation: PACU       Patient participation: complete - patient participated  Level of consciousness: awake and alert and responsive to verbal stimuli    Pain score: 0  Pain management: adequate  Airway patency: patent    Anesthetic complications: no  Cardiovascular status: acceptable  Respiratory status: acceptable  Hydration status: acceptable  Patient post-procedure temperature: Pt Normothermic   PONV Status: Absent

## 2023-01-29 NOTE — Nurses Notes (Signed)
Spoke w/ hospital pharmacy regarding using Nozin and patient's allergy to ethanol.  Per Pharmacy, patient should not use Nozin.

## 2023-01-29 NOTE — Interval H&P Note (Signed)
[facility]      H&P UPDATE FORM                                                                                  Martha White, Martha White, 57 y.o. female  Date of Admission:  01/29/2023  Date of Birth:  1965-05-10    01/29/2023    STOP: IF H&P IS GREATER THAN 30 DAYS FROM SURGICAL DAY COMPLETE NEW H&P IS REQUIRED.     H & P updated the day of the procedure.  1.  H&P completed within 30 days of surgical procedure and has been reviewed within 24 hours of admission but prior to surgery or a procedure requiring anesthesia services, the patient has been examined, and no change has occured in the patients condition since the H&P was completed.       Change in medications: No        No LMP recorded. Patient has had a hysterectomy.      Comments:     2.  Patient continues to be appropriate candidate for planned surgical procedure. YES    Esmond Plants, MD

## 2023-01-29 NOTE — Anesthesia Preprocedure Evaluation (Signed)
ANESTHESIA PRE-OP EVALUATION  Planned Procedure: PORTACATH INSERTION (Chest)  Review of Systems     anesthesia history negative     patient summary reviewed  nursing notes reviewed        Pulmonary   COPD and asthma,   Cardiovascular    Hypertension, well controlled, CHF (2 years ago), ECG reviewed, ACE / ARB inhibitor use and hyperlipidemia ,No peripheral edema,  Exercise Tolerance: > or = 4 METS   ,beta blocker therapy  ,taken in last 24 hours     GI/Hepatic/Renal    GERD and well controlled        Endo/Other    osteoarthritis and drug induced coagulopathy (Plavix last dose 01/22/23),   type 2 diabetes/ controlled with insulin/ controlled with oral medications    Neuro/Psych/MS    RLS, TIA (2004), headaches (migraines), back abnormality, fibromyalgia, anxiety, depression, Neck problems    peripheral neuropathy,  Cancer    negative hematology/oncology ROS,                     Physical Assessment      Airway       Mallampati: II    TM distance: 3 FB    Neck ROM: full  Mouth Opening: good.            Dental           (+) edentulous           Pulmonary    Breath sounds clear to auscultation  (-) no rhonchi, no decreased breath sounds, no wheezes, no rales and no stridor     Cardiovascular    Rhythm: regular  Rate: Normal  (-) no friction rub, carotid bruit is not present, no peripheral edema and no murmur     Other findings              Plan  ASA 3     Planned anesthesia type: MAC           PONV Plan:  I plan to administer pharmcologic prophalaxis antiemetics  POV PLAN:   plan for postoperative opioid use                Anesthesia issues/risks discussed are: Eye /Visual Loss, Nerve Injuries, PONV, Stroke, Aspiration, Difficult Airway, Cardiac Events/MI, Intraoperative Awareness/ Recall, Blood Loss and Sore Throat.  Anesthetic plan and risks discussed with patient  signed consent obtained          Patient's NPO status is appropriate for Anesthesia.           Plan discussed with CRNA.    (For MAC with GETA as back  up)

## 2023-01-29 NOTE — Discharge Instructions (Signed)
Do NOT remove steri strips    KEEP INCISION SITE CLEAN AND DRY.    NO LIFTING, PULLING, TUGGING WITH SURGERY arm    CALL DR FOR ANY PROBLEMS.    Take home meds for pain.

## 2023-01-29 NOTE — Anesthesia Transfer of Care (Signed)
ANESTHESIA TRANSFER OF CARE   Martha White is a 57 y.o. ,female, Weight: 66.2 kg (146 lb)   had Procedure(s):  PORTACATH INSERTION  performed  01/29/23   Primary Service: Esmond Plants, MD    Past Medical History:   Diagnosis Date   . Arthropathy    . Asthma    . Carpal tunnel syndrome    . Carpal tunnel syndrome    . Congestive heart failure (CMS HCC)    . COPD (chronic obstructive pulmonary disease) (CMS HCC)    . Diabetes mellitus, type 2 (CMS HCC)    . Fibromyalgia    . Gastroparesis    . Headache    . HTN (hypertension)    . Lumbar herniated disc    . Neuropathy (CMS HCC)    . Restless legs syndrome (RLS)    . Thyroid disease    . TIA (transient ischemic attack)    . TIA (transient ischemic attack)       Allergy History as of 01/29/23       ALCOHOL         Noted Status Severity Type Reaction    01/29/23 0740 Darlyn Read, RN 04/19/10 Deleted High  Shortness of Breath    Comments: Etoh, not rubbing alcohol       05/31/21 1740 Delene Loll, RN 04/19/10 Active High  Shortness of Breath    Comments: Etoh, not rubbing alcohol                 ETHYL ALCOHOL         Noted Status Severity Type Reaction    01/29/23 0741 Darlyn Read, RN 04/19/10 Active High  Shortness of Breath    05/31/21 1740 Delene Loll, RN 04/19/10 Active High  Shortness of Breath              INFLUENZA VIRUS VACCINE, SPECIFIC         Noted Status Severity Type Reaction    07/06/22 1100 Alicia Amel, LPN 16/10/96 Active Low  Hives/ Urticaria    05/31/21 1740 Delene Loll, RN 05/16/20 Active                 OTHER         Noted Status Severity Type Reaction    01/29/23 0741 Darlyn Read, RN 02/07/15 Deleted High  Swelling    Comments: Pneumonia and Flu vaccines     05/31/21 1740 Delene Loll, RN 02/07/15 Active High  Swelling    Comments: Pneumonia and Flu vaccines               PNEUMOCOCCAL VACCINE         Noted Status Severity Type Reaction    07/06/22 1100 Alicia Amel, LPN 04/54/09 Active Low  Hives/ Urticaria     05/31/21 1740 Delene Loll, RN 05/16/20 Active                 SULFA (SULFONAMIDES)         Noted Status Severity Type Reaction    05/31/21 1740 Delene Loll, RN 04/19/10 Active High  Anaphylaxis              PENICILLINS         Noted Status Severity Type Reaction    10/07/22 1531 Casandra Doffing, RN 10/07/22 Active Low  Hives/ Urticaria              IBUPROFEN         Noted Status Severity Type Reaction  01/16/23 1014 Meriam Sprague, LPN 54/09/81 Active                 INFLUENZA VIRUS VACCINES         Noted Status Severity Type Reaction    01/29/23 0741 Darlyn Read, RN 01/29/23 Active                     I completed my transfer of care / handoff to the receiving personnel during which we discussed:  Access, Airway, All key/critical aspects of case discussed, Analgesia, Antibiotics, Expectation of post procedure, Fluids/Product, Gave opportunity for questions and acknowledgement of understanding, Labs and PMHx  Report given to: Benay Pike, RN    Post Location: PACU                                                           Last OR Temp: Temperature: 36.1 C (97 F)  ABG:  POTASSIUM   Date Value Ref Range Status   01/02/2023 3.6 3.5 - 5.1 mmol/L Final     KETONES   Date Value Ref Range Status   12/29/2022 Negative Negative mg/dL Final     CALCIUM   Date Value Ref Range Status   01/02/2023 8.8 8.5 - 10.1 mg/dL Final     Calculated P Axis   Date Value Ref Range Status   01/02/2023 56 degrees Final     Calculated R Axis   Date Value Ref Range Status   01/02/2023 -4 degrees Final     Calculated T Axis   Date Value Ref Range Status   01/02/2023 43 degrees Final     Airway:* No LDAs found *  Blood pressure 130/76, pulse 91, temperature 36.1 C (97 F), resp. rate 16, height 1.511 m (4' 11.5"), weight 66.2 kg (146 lb), SpO2 100%.

## 2023-01-30 ENCOUNTER — Telehealth (INDEPENDENT_AMBULATORY_CARE_PROVIDER_SITE_OTHER): Payer: Self-pay | Admitting: Surgery

## 2023-02-07 ENCOUNTER — Other Ambulatory Visit: Payer: Medicare (Managed Care) | Attending: INTERNAL MEDICINE

## 2023-02-07 ENCOUNTER — Other Ambulatory Visit: Payer: Self-pay

## 2023-02-07 DIAGNOSIS — R0602 Shortness of breath: Secondary | ICD-10-CM | POA: Insufficient documentation

## 2023-02-13 LAB — NT-PROBNP: NT-PROBNP: 49 pg/mL (ref <125)

## 2023-02-19 ENCOUNTER — Other Ambulatory Visit: Payer: Self-pay

## 2023-02-19 ENCOUNTER — Emergency Department
Admission: EM | Admit: 2023-02-19 | Discharge: 2023-02-19 | Disposition: A | Discharge status: Home / Self Care | Payer: Medicare (Managed Care)

## 2023-02-19 ENCOUNTER — Encounter (HOSPITAL_BASED_OUTPATIENT_CLINIC_OR_DEPARTMENT_OTHER): Payer: Self-pay

## 2023-02-19 ENCOUNTER — Emergency Department (HOSPITAL_BASED_OUTPATIENT_CLINIC_OR_DEPARTMENT_OTHER): Payer: Medicare (Managed Care)

## 2023-02-19 DIAGNOSIS — E16A2 Hypoglycemia level 2: Secondary | ICD-10-CM | POA: Insufficient documentation

## 2023-02-19 DIAGNOSIS — Z95828 Presence of other vascular implants and grafts: Secondary | ICD-10-CM | POA: Insufficient documentation

## 2023-02-19 DIAGNOSIS — R748 Abnormal levels of other serum enzymes: Secondary | ICD-10-CM | POA: Insufficient documentation

## 2023-02-19 DIAGNOSIS — Z452 Encounter for adjustment and management of vascular access device: Secondary | ICD-10-CM

## 2023-02-19 DIAGNOSIS — Z789 Other specified health status: Secondary | ICD-10-CM

## 2023-02-19 DIAGNOSIS — I517 Cardiomegaly: Secondary | ICD-10-CM | POA: Insufficient documentation

## 2023-02-19 DIAGNOSIS — E11649 Type 2 diabetes mellitus with hypoglycemia without coma: Secondary | ICD-10-CM | POA: Insufficient documentation

## 2023-02-19 LAB — MANUAL DIFFERENTIAL
BASOPHIL %: 2 % (ref 0–3)
BASOPHIL ABSOLUTE: 0.07 10*3/uL (ref 0.00–0.30)
BASOPHILS MANUAL: 2
EOSINOPHIL %: 3 % (ref 0–7)
EOSINOPHIL ABSOLUTE: 0.11 10*3/uL (ref 0.00–0.80)
EOSINOPHILS MANUAL: 3
LYMPHOCYTE %: 42 % (ref 25–45)
LYMPHOCYTE ABSOLUTE: 1.47 10*3/uL (ref 1.10–5.00)
LYMPHOCYTES MANUAL: 42
MONOCYTE %: 7 % (ref 0–12)
MONOCYTE ABSOLUTE: 0.25 10*3/uL (ref 0.00–1.30)
MONOCYTES MANUAL: 7
NEUTROPHIL %: 46 % (ref 40–76)
NEUTROPHIL ABSOLUTE: 1.61 10*3/uL — ABNORMAL LOW (ref 1.80–8.40)
NEUTROPHILS MANUAL: 46
PLATELET MORPHOLOGY COMMENT: ADEQUATE
TOTAL CELLS COUNTED [#] IN BLOOD: 100
WBC: 3.5 10*3/uL

## 2023-02-19 LAB — COMPREHENSIVE METABOLIC PANEL, NON-FASTING
ALBUMIN/GLOBULIN RATIO: 0.8 (ref 0.8–1.4)
ALBUMIN: 3 g/dL — ABNORMAL LOW (ref 3.4–5.0)
ALKALINE PHOSPHATASE: 95 U/L (ref 46–116)
ALT (SGPT): 79 U/L — ABNORMAL HIGH (ref <=78)
ANION GAP: 8 mmol/L (ref 4–13)
AST (SGOT): 59 U/L — ABNORMAL HIGH (ref 15–37)
BILIRUBIN TOTAL: 0.3 mg/dL (ref 0.2–1.0)
BUN/CREA RATIO: 12
BUN: 11 mg/dL (ref 7–18)
CALCIUM, CORRECTED: 9.5 mg/dL
CALCIUM: 8.7 mg/dL (ref 8.5–10.1)
CHLORIDE: 107 mmol/L (ref 98–107)
CO2 TOTAL: 27 mmol/L (ref 21–32)
CREATININE: 0.89 mg/dL (ref 0.55–1.02)
ESTIMATED GFR: 75 mL/min/{1.73_m2} (ref >59)
GLOBULIN: 3.7
GLUCOSE: 75 mg/dL (ref 74–106)
OSMOLALITY, CALCULATED: 281 mosm/kg (ref 270–290)
POTASSIUM: 4 mmol/L (ref 3.5–5.1)
PROTEIN TOTAL: 6.7 g/dL (ref 6.4–8.2)
SODIUM: 142 mmol/L (ref 136–145)

## 2023-02-19 LAB — CBC WITH DIFF
HCT: 34.7 % — ABNORMAL LOW (ref 37.0–47.0)
HGB: 10.9 g/dL — ABNORMAL LOW (ref 12.5–16.0)
MCH: 26.2 pg — ABNORMAL LOW (ref 27.0–32.0)
MCHC: 31.5 g/dL — ABNORMAL LOW (ref 32.0–36.0)
MCV: 83.2 fL (ref 78.0–99.0)
MPV: 7.8 fL (ref 7.4–10.4)
PLATELETS: 254 10*3/uL (ref 140–440)
RBC: 4.17 10*6/uL — ABNORMAL LOW (ref 4.20–5.40)
RDW: 18.2 % — ABNORMAL HIGH (ref 11.6–14.8)
WBC: 3.5 10*3/uL — ABNORMAL LOW (ref 4.0–10.5)

## 2023-02-19 LAB — POC BLOOD GLUCOSE (RESULTS)
GLUCOSE, POC: 52 mg/dL — ABNORMAL LOW (ref 70–100)
GLUCOSE, POC: 122 mg/dL — ABNORMAL HIGH (ref 70–100)

## 2023-02-19 LAB — LACTIC ACID LEVEL W/ REFLEX FOR LEVEL >2.0: LACTIC ACID: 0.9 mmol/L (ref 0.4–2.0)

## 2023-02-19 MED ORDER — HEPARIN LOCK FLUSH (PORCINE) 100 UNIT/ML INTRAVENOUS SOLUTION
INTRAVENOUS | Status: AC
Start: 2023-02-19 — End: 2023-02-19
  Filled 2023-02-19: qty 5

## 2023-02-19 MED ORDER — ONDANSETRON HCL (PF) 4 MG/2 ML INJECTION SOLUTION
4.0000 mg | INTRAMUSCULAR | Status: AC
Start: 2023-02-19 — End: 2023-02-19
  Administered 2023-02-19: 4 mg via INTRAVENOUS

## 2023-02-19 MED ORDER — ONDANSETRON HCL (PF) 4 MG/2 ML INJECTION SOLUTION
INTRAMUSCULAR | Status: AC
Start: 2023-02-19 — End: 2023-02-19
  Filled 2023-02-19: qty 2

## 2023-02-19 NOTE — ED Triage Notes (Signed)
Patient states she is here to have port to right upper chest flushed. Also c/o drainage from placement site. Dr. Vinie Sill advised her to come to ED.

## 2023-02-19 NOTE — ED Nurses Note (Signed)
Patient given coke and saltine crackers. Offered a sandwich tray and patient states she is nauseated and cannot eat that right now. Provider notified.

## 2023-02-19 NOTE — ED Nurses Note (Signed)

## 2023-02-19 NOTE — ED Provider Notes (Signed)
Emergency Medicine      Name: Martha White  Age and Gender: 57 y.o. female  Date of Birth: 03/29/1965  MRN: [id number]  PCP: Ty Hilts, CNP    CC:  Chief Complaint   Patient presents with    Vascular Access Problem       HPI:  Martha White is a 57 y.o. Black/African American female who presents to the ER with reports of drainage from surgical site of right subclavian Port-A-Cath implantation.  Patient reports surgical procedure was completed 3 weeks prior.  Patient states that she was in need of a Port-A-Cath flush.  Patient states was having yellow to green drainage from the surgical site, was informed by Dr. Sabino Gasser to report to the emergency department.    Below pertinent information reviewed with patient:  Past Medical History:   Diagnosis Date    Arthropathy     Asthma     Carpal tunnel syndrome     Carpal tunnel syndrome     Congestive heart failure (CMS HCC)     COPD (chronic obstructive pulmonary disease) (CMS HCC)     Diabetes mellitus, type 2 (CMS HCC)     Fibromyalgia     Gastroparesis     Headache     HTN (hypertension)     Lumbar herniated disc     Neuropathy (CMS HCC)     Restless legs syndrome (RLS)     Thyroid disease     TIA (transient ischemic attack)     TIA (transient ischemic attack)            Allergies   Allergen Reactions    Rubbing Alcohol (Ethanol) [Ethyl Alcohol] Shortness of Breath    Sulfa (Sulfonamides) Anaphylaxis    Ibuprofen     Influenza Virus Vaccines     Influenza Virus Vaccine, Specific Hives/ Urticaria    Penicillins Hives/ Urticaria    Pneumococcal Vaccine Hives/ Urticaria       Past Surgical History:   Procedure Laterality Date    HX CHOLECYSTECTOMY      HX HERNIA REPAIR      HX HERNIA REPAIR      HX HYSTERECTOMY      HX ROTATOR CUFF REPAIR      PORTACATH INSERTION N/A 01/29/2023    Performed by Esmond Plants, MD at [facility]    STOMACH SURGERY      X4        Social History     Socioeconomic History    Marital status: Married   Tobacco Use    Smoking status: Never     Smokeless tobacco: Never   Vaping Use    Vaping status: Never Used   Substance and Sexual Activity    Alcohol use: Never    Drug use: Never    Sexual activity: Never       ROS:  No other overt positive review of systems are noted other than stated in the HPI.      Objective:    ED Triage Vitals [02/19/23 1243]   BP (Non-Invasive) (!) 157/97   Heart Rate 98   Respiratory Rate 16   Temperature 36.3 C (97.4 F)   SpO2 98 %   Weight 65.8 kg (145 lb)   Height 1.511 m (4' 11.5")     Filed Vitals:    02/19/23 1243 02/19/23 1500 02/19/23 1515 02/19/23 1530   BP: (!) 157/97 96/84  (!) 142/92   Pulse: 98 85  90  Resp: 16 14  16    Temp: 36.3 C (97.4 F)      SpO2: 98% 100% 100% 100%       Nursing notes and vital signs reviewed.    Constitutional - No acute distress.  Alert and Active.  HEENT - Normocephalic. Atraumatic. PERRL. EOMI. Conjunctiva clear. Moist mucous membranes.   Neck - Trachea midline. No stridor. No hoarseness.  Cardiac - Regular rate and rhythm. No murmurs, rubs, or gallops. Intact distal pulses.  Respiratory/Chest - Normal respiratory effort.   Abdomen - Normal bowel sounds.   Musculoskeletal - Limited ROM RUE d/t chronic shoulder.  No edema or discoloration.  PMS intact.  Skin - Warm and dry, without any rashes or other lesions.  Neuro - Alert and oriented x 3. Cranial nerves II-XII are grossly intact.  Moving all extremities symmetrically. Normal gait.  Psych - Normal mood and affect. Behavior is normal                  Any pertinent labs and imaging obtained during this encounter reviewed below in MDM.    MDM/ED Course:        Medical Decision Making  Martha White is a 57 y.o. Black/African American female who presents to the ER with reports of drainage from surgical site of right subclavian Port-A-Cath implantation.  Patient reports surgical procedure was completed 3 weeks prior.  Patient states that she was in need of a Port-A-Cath flush.  Patient states was having yellow to green drainage from  the surgical site, was informed by Dr. Sabino Gasser to report to the emergency department.    Right subclavian Port-A-Cath surgical site appears to be healing well.  There is no drainage, no edema, no warmth and no tenderness to palpation.  Patient expressed concerns of this drainage being earlier today.  While at bedside for exam patient produces an MRI report suggesting right shoulder abnormalities awaiting surgical procedure.  Patient requesting "a shot in his shoulder for the pain".  Patient informed that joint injections were not performed in the emergency department.  Patient states "no I do not want a steroid shot I was want not pain shot".  Informed patient that pain management needed to be conducted by orthopedic surgeon following this diagnosis.  Patient indicated and verbalized understanding and agreement.    Discussed plan of care to investigate her complaints with right subclavian Port-A-Cath.      Diagnostics will include labs and radiology.    Differential include surgical site infection, cellulitis, thrombosis, migration, occlusion or extravasation.    Amount and/or Complexity of Data Reviewed  Labs: ordered.  Radiology: ordered.  ECG/medicine tests: independent interpretation performed.    Risk  Prescription drug management.              ED Course as of 02/19/23 1544   Tue Feb 19, 2023   1421 Highly unlikely for surgical site infection or cellulitis due to assessment being unremarkable.  The right subclavian area surgical site and skin is warm, dry, normal and intact, there is no drainage, there is no edema.  There is no tenderness on palpation.   1433 POC BLOOD GLUCOSE (RESULTS)(!)  Below normal at 52, no symptoms reported other than nausea.  Nursing instructed to give patient food after administering Zofran ODT.   1514 CBC/DIFF(!)  WBC below normal 3.5, RBC below normal 4.17, hemoglobin below normal 10.9, hematocrit below normal 34.7, MCH below normal 26.2, MCHC below normal 31.5, RDW elevated 18.2  1515 COMPREHENSIVE METABOLIC PANEL, NON-FASTING(!)  Albumin below normal 3.0, ALT elevated 79, AST elevated at 59   1515 LACTIC ACID LEVEL W/ REFLEX FOR LEVEL >2.0  Negative   1515 XR AP MOBILE CHEST  FINDINGS:  Right central venous catheter in the SVC   Heart size is mildly enlarged.     There are no acute infiltrates or pneumothorax   There is dorsal spurring. There is hardware over the left humeral head. There is a possible bone island in the right humeral head        IMPRESSION:  Negative for pneumothorax after catheter placement     1520 Nursing advised no difficulties accessing right subclavian Port-A-Cath, good blood return and flushes easily.   1538 Discussed results and plan of care which include discharge to home.  Patient was advised that at this point there is no signs of infection present.  We will contact the patient upon receipt of blood culture results.  Patient encouraged to follow up with primary care provider as well as maintain all specialty services appointments as scheduled.  Patient encouraged to return to the emergency department if symptoms persist, worsen or new symptoms of concern develop.  Patient indicated and verbalized understanding and agreement.       Orders Placed This Encounter    ADULT ROUTINE BLOOD CULTURE, SET OF 2 ADULT BOTTLES (BACTERIA AND YEAST)    ADULT ROUTINE BLOOD CULTURE, SET OF 2 ADULT BOTTLES (BACTERIA AND YEAST)    XR AP MOBILE CHEST    CBC/DIFF    COMPREHENSIVE METABOLIC PANEL, NON-FASTING    LACTIC ACID LEVEL W/ REFLEX FOR LEVEL >2.0    CBC WITH DIFF    MANUAL DIFFERENTIAL    ondansetron (ZOFRAN) 2 mg/mL injection         Any procedures:  Procedures    Impression:   Clinical Impression   Problem with vascular access - Perceived:  No abnormality detected (Primary)       Disposition: Discharged    / Corinna Lines, FNP-BC  02/19/2023, 13:15  [facility]  Department of Emergency Medicine  [facility]    Portions of this note may have been  dictated using voice recognition software.     -----------------------  Results for orders placed or performed during the hospital encounter of 02/19/23 (from the past 12 hour(s))   COMPREHENSIVE METABOLIC PANEL, NON-FASTING   Result Value Ref Range    SODIUM 142 136 - 145 mmol/L    POTASSIUM 4.0 3.5 - 5.1 mmol/L    CHLORIDE 107 98 - 107 mmol/L    CO2 TOTAL 27 21 - 32 mmol/L    ANION GAP 8 4 - 13 mmol/L    BUN 11 7 - 18 mg/dL    CREATININE 5.36 6.44 - 1.02 mg/dL    BUN/CREA RATIO 12     ESTIMATED GFR 75 >59 mL/min/1.8m^2    ALBUMIN 3.0 (L) 3.4 - 5.0 g/dL    CALCIUM 8.7 8.5 - 03.4 mg/dL    GLUCOSE 75 74 - 742 mg/dL    ALKALINE PHOSPHATASE 95 46 - 116 U/L    ALT (SGPT) 79 (H) <=78 U/L    AST (SGOT) 59 (H) 15 - 37 U/L    BILIRUBIN TOTAL 0.3 0.2 - 1.0 mg/dL    PROTEIN TOTAL 6.7 6.4 - 8.2 g/dL    ALBUMIN/GLOBULIN RATIO 0.8 0.8 - 1.4    OSMOLALITY, CALCULATED 281 270 - 290 mOsm/kg    CALCIUM, CORRECTED 9.5 mg/dL  GLOBULIN 3.7    LACTIC ACID LEVEL W/ REFLEX FOR LEVEL >2.0   Result Value Ref Range    LACTIC ACID 0.9 0.4 - 2.0 mmol/L   CBC WITH DIFF   Result Value Ref Range    WBC 3.5 (L) 4.0 - 10.5 x10^3/uL    RBC 4.17 (L) 4.20 - 5.40 x10^6/uL    HGB 10.9 (L) 12.5 - 16.0 g/dL    HCT 47.8 (L) 29.5 - 47.0 %    MCV 83.2 78.0 - 99.0 fL    MCH 26.2 (L) 27.0 - 32.0 pg    MCHC 31.5 (L) 32.0 - 36.0 g/dL    RDW 62.1 (H) 30.8 - 14.8 %    PLATELETS 254 140 - 440 x10^3/uL    MPV 7.8 7.4 - 10.4 fL   POC BLOOD GLUCOSE (RESULTS)   Result Value Ref Range    GLUCOSE, POC 52 (L) 70 - 100 mg/dl     XR AP MOBILE CHEST   Final Result   Negative for pneumothorax after catheter placement            Radiologist location ID: [id number]

## 2023-02-19 NOTE — Discharge Instructions (Addendum)
Please follow up with your primary care provider.  Please maintain all specialty services appointments as scheduled.  Return to the emergency department if symptoms persist, worsen or new symptoms of concern develop.    Thank you for allowing [place] to be part of your care.    Please discuss all medications with your pharmacist to ensure there are no concerns of interactions.    Please ensure all questions or concerns are addressed prior to leaving the hospital. We want to make sure your concerns are addressed to make sure you are as safe and healthy as possible. By leaving the hospital, it is understood you are in agreement with your treatment plan.    You may have received sedating medication during your visit. Please discuss this with your discharging provider nurse as you may not be able to operate machines while the medication is in your system, or while you are taking any potentially sedating prescriptions.    Please call the hospital medical records office for a copy of your finalized results, and review them with a primary care physician, for any findings needing further attention.    If you feel your situation worsens, or does not get better in 48 hours, please see a physician for evaluation.    We encourage you to see your regular doctor as soon as possible to let them know you were seen in the emergency department. They may want to do further testing. If you do not have a doctor, please feel free to call the hospital, and ask for contact information of accepting providers. Please also discuss your vaccinations, and ensure all are up to date.    You may use this document to take today off work or school.

## 2023-02-19 NOTE — ED Nurses Note (Signed)
Patient's husband brought patient food. Patient eating and drinking a sweet tea. No other needs or concerns at this time.

## 2023-02-24 LAB — ADULT ROUTINE BLOOD CULTURE, SET OF 2 BOTTLES (BACTERIA AND YEAST)
BLOOD CULTURE, ROUTINE: NO GROWTH
BLOOD CULTURE, ROUTINE: NO GROWTH

## 2023-03-21 ENCOUNTER — Other Ambulatory Visit: Payer: Self-pay

## 2023-03-21 ENCOUNTER — Emergency Department
Admission: EM | Admit: 2023-03-21 | Discharge: 2023-03-21 | Disposition: A | Discharge status: Home / Self Care | Payer: Medicare Other | Attending: EMERGENCY MEDICINE | Admitting: EMERGENCY MEDICINE

## 2023-03-21 DIAGNOSIS — Z452 Encounter for adjustment and management of vascular access device: Secondary | ICD-10-CM | POA: Insufficient documentation

## 2023-03-21 DIAGNOSIS — Z95828 Presence of other vascular implants and grafts: Secondary | ICD-10-CM | POA: Insufficient documentation

## 2023-03-21 NOTE — ED Nurses Note (Signed)
Oriented times four. Respirations even and nonlabored on room air. Denies pain and discomfort. No PIV to remove. Discharge instructions provided and reviewed. Patient oriented times four and verbalizes understanding of all information provided. Patient left this facility ambulatory and in care of self. Care relinquished at this time.

## 2023-03-21 NOTE — ED Triage Notes (Signed)
Need port flushed - last flushed first week of December. States pain around the are of the port.

## 2023-03-21 NOTE — ED Provider Notes (Signed)
Department of Emergency Medicine  [facility]  03/21/2023        Patient is a 58 y.o.  female presenting to the ED with chief complaint of concerns for port problem.  Patient reports that report was placed by Dr. Sabino Gasser in October.  Noticed yesterday that the area was itchy and today noticed that it seems more prominent on her chest.  No significant pain or swelling.  No erythema warmth or induration overlying the port area per her report.  Has not had the port accessed since early December.  It is concerned that it may need to be flushed.  Denies fever/chills, chest pain, shortness for breath or any other symptoms at this time.     Review of Systems:  All systems below reviewed and negative unless otherwise noted above in the HPI  CONSTITUTIONAL  CARDIOVASCULAR  PULMONARY  GASTROINTESTINAL  GENITOURINARY  MUSCULOSKELETAL  SKIN  NEURO      Past Medical History:  Past Medical History:  Past Medical History:   Diagnosis Date    Arthropathy     Asthma     Carpal tunnel syndrome     Carpal tunnel syndrome     Congestive heart failure (CMS HCC)     COPD (chronic obstructive pulmonary disease) (CMS HCC)     Diabetes mellitus, type 2 (CMS HCC)     Fibromyalgia     Gastroparesis     Headache     HTN (hypertension)     Lumbar herniated disc     Neuropathy (CMS HCC)     Restless legs syndrome (RLS)     Thyroid disease     TIA (transient ischemic attack)     TIA (transient ischemic attack)        Past Surgical History:  Past Surgical History:   Procedure Laterality Date    Hx cholecystectomy      Hx hernia repair      Hx hernia repair      Hx hysterectomy      Hx rotator cuff repair      Stomach surgery         Social History:  Social History     Tobacco Use    Smoking status: Never    Smokeless tobacco: Never   Vaping Use    Vaping status: Never Used   Substance Use Topics    Alcohol use: Never    Drug use: Never     Social History     Substance and Sexual Activity   Drug Use Never       Family History:  No  family history on file.      Above history reviewed.  Allergies, medication list, and old records also reviewed.     Filed Vitals:    03/21/23 1217   BP: 136/89   Pulse: (!) 105   Resp: 16   Temp: 36.5 C (97.7 F)   SpO2: 100%       Physical Exam:     Nursing note and past vitals reviewed.  Vital signs reviewed as above. No acute distress.   Constitutional: Pt is well-developed and well-nourished. No acute distress.   Head: Normocephalic and atraumatic.   Eyes: Conjunctivae are normal. EOM are intact.  Cardiovascular: RRR.  Normal peripheral perfusion.   Pulmonary/Chest: No increased work of breathing or accessory muscle use. Symmetrical expansion.  Chest wall:  Port-A-Cath is in place and nontender.  No overlying erythema, warmth or induration.  No palpable fluctuance.  Abdominal: Nondistended.  Musculoskeletal: Normal range of motion. No deformities.  Exhibits no edema   Neurological: Awake and alert.  Moves all extremities spontaneously.  No new focal deficits noted.  Skin: Warm and dry. No obvious rash or lesions  Psychiatric: Normal mood and affect.     Workup:     Labs:  No results found for this or any previous visit (from the past 24 hour(s)).    Imaging:         No orders of the defined types were placed in this encounter.      Abnormal Lab results:  Labs Ordered/Reviewed - No data to display      Plan: Appropriate labs and imaging ordered. Medical Records reviewed.    MDM:   During the patient's stay in the emergency department, the above listed imaging and/or labs were performed to assist with medical decision making and were reviewed by myself when available for review.     Medical Decision Making       Patient is a 58 year old female presenting today with concern for vascular access problem with her port.  On arrival, patient was afebrile and hemodynamically stable.  Exam revealed no erythema, induration or fluctuance overlying the porch location.  Patient has not had her port accessed in one month.   Highly doubt that this represents infection.  Patient has had no fevers chills or other infectious symptoms.  We discussed obtaining imaging as well as Infectious lab work to rule out evidence of infection; however, patient declines at this time.  Also to access the port, flush it and re heparinize it to ensure that has not occluded.  Patient also declines this.  She states if she develops fevers or worsening symptoms/pain she will return to the department for full infectious workup.  She will also follow up outpatient with the PCP.  Patient is stable for discharge home at this time.    Impression:  Port-A-Cath in place, concern for port dysfunction  Plan:  Discharge        This note was partially created using voice recognition software and is inherently subject to errors including those of syntax and "sound alike " substitutions which may escape proof reading.  In such instances, original meaning may be extrapolated by contextual derivation.       Thayer Ohm, MD//     Department of Emergency Medicine    [facility]

## 2023-03-21 NOTE — ED APP Handoff Note (Signed)
[facility] - Emergency Department  Emergency Department  Provider in Triage Note    Name: Martha White  Age: 58 y.o.  Gender: female     Subjective:   Martha White is a 58 y.o. female who presents with complaint of Vascular Access Problem  . Reports port is "bulging" out and hurting. Started last night. Port placed by Dr Vinie Sill 4-5 months ago. No blood thinners.    Objective:   Filed Vitals:    03/21/23 1217   BP: 136/89   Pulse: (!) 105   Resp: 16   Temp: 36.5 C (97.7 F)   SpO2: 100%      Vitals are also documented in the EMR.    Focused Physical Exam shows no acute distress    Assessment:  A medical screening exam was completed.  This patient is a 58 y.o. female with Vascular Access Problem  .    Plan:  Please see initial orders and work-up in the EMR.  This is to be continued with full evaluation in the main Emergency Department.     No current facility-administered medications for this encounter.     No results found for this or any previous visit (from the past 24 hour(s)).       Stephannie Li PA-C, MPAS   03/21/2023, 12:15   Department of Emergency Medicine  [facility]

## 2023-04-20 ENCOUNTER — Emergency Department (HOSPITAL_BASED_OUTPATIENT_CLINIC_OR_DEPARTMENT_OTHER): Payer: Medicare Other

## 2023-04-20 ENCOUNTER — Other Ambulatory Visit: Payer: Self-pay

## 2023-04-20 ENCOUNTER — Encounter (HOSPITAL_BASED_OUTPATIENT_CLINIC_OR_DEPARTMENT_OTHER): Payer: Self-pay

## 2023-04-20 ENCOUNTER — Emergency Department
Admission: EM | Admit: 2023-04-20 | Discharge: 2023-04-21 | Disposition: A | Discharge status: Home / Self Care | Payer: Medicare Other | Source: Home / Self Care | Attending: EMERGENCY MEDICINE | Admitting: EMERGENCY MEDICINE

## 2023-04-20 DIAGNOSIS — R0789 Other chest pain: Secondary | ICD-10-CM | POA: Insufficient documentation

## 2023-04-20 DIAGNOSIS — M797 Fibromyalgia: Secondary | ICD-10-CM | POA: Insufficient documentation

## 2023-04-20 DIAGNOSIS — I110 Hypertensive heart disease with heart failure: Secondary | ICD-10-CM | POA: Insufficient documentation

## 2023-04-20 DIAGNOSIS — K3184 Gastroparesis: Secondary | ICD-10-CM | POA: Insufficient documentation

## 2023-04-20 DIAGNOSIS — J4489 Other specified chronic obstructive pulmonary disease: Secondary | ICD-10-CM | POA: Insufficient documentation

## 2023-04-20 DIAGNOSIS — E1165 Type 2 diabetes mellitus with hyperglycemia: Secondary | ICD-10-CM | POA: Insufficient documentation

## 2023-04-20 DIAGNOSIS — R000 Tachycardia, unspecified: Secondary | ICD-10-CM | POA: Insufficient documentation

## 2023-04-20 DIAGNOSIS — I509 Heart failure, unspecified: Secondary | ICD-10-CM | POA: Insufficient documentation

## 2023-04-20 LAB — MANUAL DIFFERENTIAL
BLAST %: 2 %
BLAST ABSOLUTE: 0.09 10*3/uL
BLASTS MANUAL: 2
EOSINOPHIL %: 2 % (ref 0–7)
EOSINOPHIL ABSOLUTE: 0.09 10*3/uL (ref 0.00–0.80)
EOSINOPHILS MANUAL: 2
LYMPHOCYTE %: 29 % (ref 25–45)
LYMPHOCYTE ABSOLUTE: 1.25 10*3/uL (ref 1.10–5.00)
LYMPHOCYTES MANUAL: 29
METAMYELOCYTE %: 2 %
METAMYELOCYTE ABSOLUTE: 0.09 10*3/uL
METAMYELOCYTES MANUAL: 2
MONOCYTE %: 3 % (ref 0–12)
MONOCYTE ABSOLUTE: 0.13 10*3/uL (ref 0.00–1.30)
MONOCYTES MANUAL: 3
MYELOCYTE %: 2 %
MYELOCYTE ABSOLUTE: 0.09 10*3/uL
MYELOCYTES MANUAL: 2
NEUTROPHIL %: 60 % (ref 40–76)
NEUTROPHIL ABSOLUTE: 2.58 10*3/uL (ref 1.80–8.40)
NEUTROPHILS MANUAL: 60
PLATELET MORPHOLOGY COMMENT: NORMAL
TOTAL CELLS COUNTED [#] IN BLOOD: 100
WBC: 4.3 10*3/uL

## 2023-04-20 LAB — BASIC METABOLIC PANEL
ANION GAP: 10 mmol/L (ref 4–13)
BUN/CREA RATIO: 8
BUN: 10 mg/dL (ref 7–18)
CALCIUM: 8.2 mg/dL — ABNORMAL LOW (ref 8.5–10.1)
CHLORIDE: 102 mmol/L (ref 98–107)
CO2 TOTAL: 23 mmol/L (ref 21–32)
CREATININE: 1.23 mg/dL — ABNORMAL HIGH (ref 0.55–1.02)
ESTIMATED GFR: 51 mL/min/{1.73_m2} — ABNORMAL LOW (ref >59)
GLUCOSE: 323 mg/dL — ABNORMAL HIGH (ref 74–106)
OSMOLALITY, CALCULATED: 282 mosm/kg (ref 270–290)
POTASSIUM: 3.4 mmol/L — ABNORMAL LOW (ref 3.5–5.1)
SODIUM: 135 mmol/L — ABNORMAL LOW (ref 136–145)

## 2023-04-20 LAB — CBC WITH DIFF
HCT: 31.9 % (ref 31.2–41.9)
HGB: 10 g/dL — ABNORMAL LOW (ref 10.9–14.3)
MCH: 27 pg (ref 24.7–32.8)
MCHC: 31.3 g/dL — ABNORMAL LOW (ref 32.3–35.6)
MCV: 86.3 fL (ref 75.5–95.3)
MPV: 7.7 fL — ABNORMAL LOW (ref 7.9–10.8)
PLATELETS: 211 10*3/uL (ref 140–440)
RBC: 3.7 10*6/uL (ref 3.63–4.92)
RDW: 18.1 % — ABNORMAL HIGH (ref 12.3–17.7)
WBC: 4.3 10*3/uL (ref 3.8–11.8)

## 2023-04-20 LAB — TROPONIN-I: TROPONIN I: <2 ng/L (ref <15)

## 2023-04-20 MED ORDER — CHLORZOXAZONE 500 MG TABLET
500.0000 mg | ORAL_TABLET | Freq: Four times a day (QID) | ORAL | 0 refills | Status: DC | PRN
Start: 2023-04-20 — End: 2024-01-03

## 2023-04-20 MED ORDER — CYCLOBENZAPRINE 10 MG TABLET
10.0000 mg | ORAL_TABLET | ORAL | Status: AC
Start: 2023-04-20 — End: 2023-04-20
  Administered 2023-04-20: 10 mg via ORAL

## 2023-04-20 MED ORDER — ALUMINUM-MAG HYDROXIDE-SIMETHICONE 200 MG-200 MG-20 MG/5 ML ORAL SUSP
ORAL | Status: AC
Start: 2023-04-20 — End: 2023-04-20
  Filled 2023-04-20: qty 30

## 2023-04-20 MED ORDER — HEPARIN (PORCINE) 1,000 UNIT/ML INJECTION SOLUTION
INTRAVENOUS | Status: DC
Start: 2023-04-21 — End: 2023-04-20

## 2023-04-20 MED ORDER — SODIUM CHLORIDE 0.9 % (FLUSH) INJECTION SYRINGE
20.0000 mL | INJECTION | INTRAMUSCULAR | Status: DC | PRN
Start: 2023-04-20 — End: 2023-04-21

## 2023-04-20 MED ORDER — ETHYL ALCOHOL 62 % TOPICAL SWAB
1.0000 | Freq: Two times a day (BID) | CUTANEOUS | Status: DC
Start: 2023-04-20 — End: 2023-04-21
  Administered 2023-04-20: 0 via NASAL

## 2023-04-20 MED ORDER — HEPARIN LOCK FLUSH (PORCINE) 100 UNIT/ML INTRAVENOUS SOLUTION
5.0000 mL | Freq: Once | INTRAVENOUS | Status: AC
Start: 2023-04-21 — End: 2023-04-20
  Administered 2023-04-20: 5 mL

## 2023-04-20 MED ORDER — HEPARIN LOCK FLUSH (PORCINE) 100 UNIT/ML INTRAVENOUS SOLUTION
INTRAVENOUS | Status: AC
Start: 2023-04-20 — End: 2023-04-20
  Filled 2023-04-20: qty 5

## 2023-04-20 MED ORDER — SODIUM CHLORIDE 0.9 % (FLUSH) INJECTION SYRINGE
10.0000 mL | INJECTION | Freq: Three times a day (TID) | INTRAMUSCULAR | Status: DC
Start: 2023-04-20 — End: 2023-04-21
  Administered 2023-04-20: 0 mL via INTRAVENOUS

## 2023-04-20 MED ORDER — LIDOCAINE HCL 2 % MUCOSAL SOLUTION
Status: AC
Start: 2023-04-20 — End: 2023-04-20
  Filled 2023-04-20: qty 15

## 2023-04-20 MED ORDER — GI COCKTAIL (ANTACID SUSP, HYOSCYAMINE, LIDOCAINE)
55.0000 mL | Freq: Once | ORAL | Status: AC
Start: 2023-04-20 — End: 2023-04-20
  Administered 2023-04-20: 55 mL via ORAL
  Filled 2023-04-20: qty 55

## 2023-04-20 MED ORDER — CYCLOBENZAPRINE 10 MG TABLET
ORAL_TABLET | ORAL | Status: AC
Start: 2023-04-20 — End: 2023-04-20
  Filled 2023-04-20: qty 1

## 2023-04-20 NOTE — ED Provider Notes (Signed)
[facility]  ED Primary Provider Note    Emergency Department Provider Note.  Enc:  04/20/2023  9:39 PM  Sway Guttierrez MRN: [id number] Acct#  [facility], [place] - EMERGENCY DEPARTMENT    HPI    Martha White is a 58 y.o. female who presents to ED with CC: Chest Pain  (Pt reports CP starting last, worsening with movement. Reports hx of CHF )  History of asthma/CHF/non oxygen dependent chronic obstructive pulmonary disease/poorly controlled DM2 with gastroparesis/fibromyalgia/hypertension/TIA who regularly presents to the emergency department 1-2 times per month for various different complaints who now presents with left chest wall pain worse with deep inspiration.  Review of systems otherwise negative.  Onset of symptoms yesterday p.m. or greater than 12 hours ago.    REVIEW OF SYSTEMS    All systems reviewed and negative except as per HPI.  General:  Denies fever  HEENT:  Denies congestion/ear pain/rhinorrhea/sore throat  Head:   Denies head injury  Respiratory:  Denies wheezing/shortness of breath/cough  Cardiac:  Positive left anterior chest wall pain; but no palpitations/syncope  GI:   Denies abdominal pain/nausea/vomiting/diarrhea  Endocrine:  Denies polydipsia  GU:   Denies dysuria/urgency/frequency/hesitancy/hematuria  MSK:   Denies extremity/back pain  Neurologic:  Denies weakness/tingling/numbness/headache  Psychiatric:  Denies anxiety/depression  Skin:   Denies rash    PAST MEDICAL HISTORY  Current Outpatient Medications   Medication Sig    albuterol sulfate (PROVENTIL OR VENTOLIN OR PROAIR) 90 mcg/actuation Inhalation oral inhaler Take 1-2 Puffs by inhalation Every 6 hours as needed    amLODIPine (NORVASC) 10 mg Oral Tablet Take 1 Tablet (10 mg total) by mouth Once a day    aspirin (ECOTRIN) 81 mg Oral Tablet, Delayed Release (E.C.) Take 1 Tablet (81 mg total) by mouth Once a day    atorvastatin calcium (ATORVASTATIN ORAL) Take 80 mg by mouth Once a day     azithromycin (ZITHROMAX) 500 mg Oral Tablet Take 1 Tablet (500 mg total) by mouth Every 24 hours    bethanechol chloride (URECHOLINE) 10 mg Oral Tablet Take 0.5 Tablets (5 mg total) by mouth Once a day    chlorzoxazone (PARAFON FORTE) 500 mg Oral Tablet Take 1 Tablet (500 mg total) by mouth Every 6 hours as needed for Muscle spasms    clobetasoL (TEMOVATE) 0.05 % Ointment APPLY TO THE AFFECTED AREA(S) TWICE DAILY FOR TWO WEEKS; STOP FOR two WEEKS THEN USE AS NEEDED FOR itch. do not USE ON face, groin, OR long term.    clopidogreL (PLAVIX) 75 mg Oral Tablet Take 1 Tablet (75 mg total) by mouth Once a day    cyanocobalamin (VITAMIN B12) 1,000 mcg/mL Injection Solution inject 1ml SUBCUTANEOUSLY EVERY A MONTH    cyclobenzaprine (FLEXERIL) 10 mg Oral Tablet Take 1 Tablet (10 mg total) by mouth Twice daily    diphenhydrAMINE (BANOPHEN) 50 mg Oral Capsule TAKE 1 CAPSULE BY MOUTH EVERY 6 HOURS AS NEEDED FOR UP TO 7 DAYS (GENERIC FOR BENADRYL)    divalproex (DEPAKOTE ER) 250 mg Oral Tablet Sustained Release 24 hr Take 1 Tablet (250 mg total) by mouth (Patient not taking: Reported on 01/29/2023)    docusate sodium (COLACE) 100 mg Oral Capsule 100 mg by oral route.    dulaglutide (TRULICITY) 0.75 mg/0.5 mL Subcutaneous Pen Injector Inject 0.5 mL (0.75 mg total) under the skin Every 7 days    DULoxetine (CYMBALTA DR) 30 mg Oral Capsule, Delayed Release(E.C.) Take 1 Capsule (30 mg total) by mouth  Twice daily    ergocalciferol, vitamin D2, (DRISDOL) 1,250 mcg (50,000 unit) Oral Capsule Take 1 Capsule (50,000 Units total) by mouth Every 7 days    famotidine (PEPCID) 20 mg Oral Tablet Take 1 Tablet (20 mg total) by mouth Once a day    furosemide (LASIX) 20 mg Oral Tablet Take 1 Tablet (20 mg total) by mouth Once per day as needed    gabapentin (NEURONTIN) 400 mg Oral Capsule Take 1 Capsule (400 mg total) by mouth Three times a day    hydrOXYzine pamoate (VISTARIL) 25 mg Oral Capsule Take 2 Capsules (50 mg total) by mouth Once per  day as needed    insulin detemir U-100 (LEVEMIR FLEXPEN) 100 unit/mL (3 mL) Subcutaneous Insulin Pen Inject 60 Units under the skin Once per day as needed (SLIDING SCALE)    insulin lispro (HUMALOG KWIKPEN INSULIN SUBQ) Inject under the skin 7-20 units tid before meals    ipratropium/albuterol sulfate (COMBIVENT INHL) Take 1 Puff by inhalation Four times a day as needed    ketorolac tromethamine (TORADOL) 10 mg Oral Tablet Take 1 Tablet (10 mg total) by mouth Every 6 hours as needed for Pain    lisinopriL (PRINIVIL) 20 mg Oral Tablet Take 1 Tablet (20 mg total) by mouth Once a day    loratadine (CLARITIN) 10 mg Oral Tablet Take 1 Tablet (10 mg total) by mouth Once per day as needed    MetFORMIN (GLUCOPHAGE) 1,000 mg Oral Tablet Take 1 Tablet (1,000 mg total) by mouth Twice daily with food    metoclopramide (REGLAN) 100 mcg/mL Oral Solution Take 5 mL (500 mcg total) by mouth Three times daily before meals    metoprolol succinate (TOPROL-XL) 50 mg Oral Tablet Sustained Release 24 hr Take 1 Tablet (50 mg total) by mouth Once a day    omeprazole (PRILOSEC) 40 mg Oral Capsule, Delayed Release(E.C.) Take 1 Capsule (40 mg total) by mouth Once a day    ondansetron (ZOFRAN ODT) 4 mg Oral Tablet, Rapid Dissolve Take 1 Tablet (4 mg total) by mouth Every 8 hours as needed for Nausea/Vomiting    rOPINIRole (REQUIP) 1 mg Oral Tablet Take 1 Tablet (1 mg total) by mouth Three times a day    tiZANidine (ZANAFLEX) 4 mg Oral Tablet Take 1 Tablet (4 mg total) by mouth Three times a day (Patient taking differently: Take 1 Tablet (4 mg total) by mouth Three times a day as needed)    traMADoL (ULTRAM) 50 mg Oral Tablet Take 1 Tablet (50 mg total) by mouth Every 6 hours as needed for Pain    traZODone (DESYREL) 100 mg Oral Tablet Take 1 Tablet (100 mg total) by mouth Every night    verapamiL (CALAN) 40 mg Oral Tablet Take 1 Tablet (40 mg total) by mouth Once per day as needed (HEADACHE)     Allergies   Allergen Reactions    Rubbing Alcohol  (Ethanol) [Ethyl Alcohol] Shortness of Breath    Sulfa (Sulfonamides) Anaphylaxis    Ibuprofen     Influenza Virus Vaccines     Influenza Virus Vaccine, Specific Hives/ Urticaria    Penicillins Hives/ Urticaria    Pneumococcal Vaccine Hives/ Urticaria     Past Medical History:   Diagnosis Date    Arthropathy     Asthma     Carpal tunnel syndrome     Carpal tunnel syndrome     Congestive heart failure (CMS HCC)     COPD (chronic obstructive pulmonary disease) (CMS  HCC)     Diabetes mellitus, type 2 (CMS HCC)     Fibromyalgia     Gastroparesis     Headache     HTN (hypertension)     Lumbar herniated disc     Neuropathy (CMS HCC)     Restless legs syndrome (RLS)     Thyroid disease     TIA (transient ischemic attack)     TIA (transient ischemic attack)          Past Surgical History:   Procedure Laterality Date    HX CHOLECYSTECTOMY      HX HERNIA REPAIR      HX HERNIA REPAIR      HX HYSTERECTOMY      HX ROTATOR CUFF REPAIR      STOMACH SURGERY      X4         Family Medical History:    None         Social History     Socioeconomic History    Marital status: Married   Tobacco Use    Smoking status: Never    Smokeless tobacco: Never   Vaping Use    Vaping status: Never Used   Substance and Sexual Activity    Alcohol use: Never    Drug use: Never    Sexual activity: Never       PHYSICAL EXAMINATION  ED Triage Vitals [04/20/23 2141]   BP (Non-Invasive) (!) 162/83   Heart Rate (!) 124   Respiratory Rate (!) 24   Temperature 37.2 C (98.9 F)   SpO2 100 %   Weight 66.2 kg (146 lb)   Height 1.499 m (4\' 11" )       Constitutional Well appearing, nontoxic, well-hydrated, strong under current anxiety  Head:   Atraumatic  Eyes:   Normal conjunctivae  ENT:   Normal external ears, nose and mouth  Neck:   Full range of motion.  No meningismus.  Respiratory:  Clear to auscultation bilaterally, no increased work of breathing/increased respiratory rate/retraction/hypoxia/labor respiration/respiratory distress  Cards:   Regular rate and  rhythm, no murmurs; reproducible left anterior chest wall pain with palpation and deep inspiration  ABD:   Soft, nontender, nondistended.  No shake/rebound/guarding.  Skin:   No apparent petechiae or rashes  Back:   No midline or flank tenderness  Extremities:  No cyanosis or edema  Neurologic:  Awake and alert  Psychiatric:  Normal mood and affect    DIAGNOSTIC DATA  Lab results:  Labs Ordered/Reviewed   BASIC METABOLIC PANEL - Abnormal; Notable for the following components:       Result Value    SODIUM 135 (*)     POTASSIUM 3.4 (*)     CALCIUM 8.2 (*)     GLUCOSE 323 (*)     CREATININE 1.23 (*)     ESTIMATED GFR 51 (*)     All other components within normal limits    Narrative:     Estimated Glomerular Filtration Rate (eGFR) is calculated using the CKD-EPI (2021) equation, intended for patients 58 years of age and older. If gender is not documented or "unknown", there will be no eGFR calculation.   CBC WITH DIFF - Abnormal; Notable for the following components:    HGB 10.0 (*)     MCHC 31.3 (*)     RDW 18.1 (*)     MPV 7.7 (*)     All other components within normal limits   TROPONIN-I -  Normal    Narrative:     Values received on females ranging between 12-15 ng/L MUST include the next serial troponin to review changes in the delta differences as the reference range for the Access II chemistry analyzer is lower than the established reference range.                     CBC/DIFF    Narrative:     The following orders were created for panel order CBC/DIFF.                  Procedure                               Abnormality         Status                                     ---------                               -----------         ------                                     CBC WITH AVWU[[id number]]                Abnormal            Final result                               MANUAL DIFFERENTIAL[[id number]]                              Final result                                                 Please view results  for these tests on the individual orders.   MANUAL DIFFERENTIAL       XR AP MOBILE CHEST   Final Result by Edi, Radresults In (02/01 2230)   NO ACUTE FINDINGS.            Radiologist location ID: [id number]             All of above interpreted by me.    COURSE/MEDICAL DECISION-MAKING  Course:  Pearla Mckinny presented to emergency department by  car FOR EVALUATION.  I REVIEWED NURSING NOTES.    Medical decision-making:  ED course:  H&P as above.  Physical examination as noted.  EKG with no acute ischemic change.  Workup with labs including troponin and chest x-ray.  ADD:  Negative workup overall with negative troponin.  Chest x-ray with no focal findings.  Heart score less than 4.  Does not warrant admission.  Treat with Flexeril and discharged with prescription for Parafon.  Suspect acute chest wall pain.  Anxiety component possible.  Considered need for additional workup including COVID/influenza/RSV swab but not currently indicated.  Considered need for treatment with:  Antibiotics but no pneumonia  on chest x-ray.  Consider need for admission but not currently indicated.  No other workup or interventions are indicated at this time.  DC with follow-up PCP.    The patient has received medical screening examination and within reasonable clinical confidence stabilized in ED  1. Provisional diagnosis:  Left chest wall pain  2. Differential diagnoses:  STEMI versus non ST elevation myocardial infarction versus gastroesophageal reflux disease/gastritis versus costochondritis versus anxiety versus pneumothorax versus pneumonia versus CHF versus acute exacerbation chronic obstructive pulmonary disease  3. Independent history provided by:  Patient, husband at bedside  4. Prior external records reviewed: Prior EMR  5. Patient's case/impression/treatment/care plan summarize/discussed with: Patient, husband at bedside  6. Social determinants affecting healthcare: None  7. I interpreted labs:  ED Course as of 04/20/23 2302    Sat Apr 20, 2023   2244 TROPONIN-I: <2  Negative   2244 WBC: 4.3  Normal   2244 HGB(!): 10.0  Normal   2244 HCT: 31.9  Normal   2244 PLATELET COUNT: 211  Normal   2244 SODIUM(!): 135  Normal   2244 POTASSIUM(!): 3.4  Normal   2244 CHLORIDE: 102  Normal   2244 CARBON DIOXIDE: 23  Normal   2244 ANION GAP: 10  Normal   2244 GLUCOSE(!): 323  Elevated with poorly controlled type 2 diabetes   2244 CREATININE(!): 1.23  History of chronic kidney disease hence normal for patient   2244 ESTIMATED GLOMERULAR FILTRATION RATE(!): 51  Chronic kidney disease stage 2     8. I interpreted diagnostics:  EKG:  Normal sinus rhythm, no ST-T changes, normal electrical axis and no ectopy.  No change from prior.    Chest x-ray: No acute findings with no infiltrate/pneumonia  9. I considered admission/transfer:  YES but not currently indicated  10. I considered medications/prescriptions but did not order:  antibiotics  11. I considered labs/diagnostics but did not order:  COVID/influenza/RSV swab  12. At time of disposition, patient medically screened/stable  For:   Discharge   Condition: Stable   Patient is stable for outpatient management and given outpatient care instructions, parameters for return and need for timely follow up.   Prescribed medications:  See medications listed below    New Prescriptions    CHLORZOXAZONE (PARAFON FORTE) 500 MG ORAL TABLET    Take 1 Tablet (500 mg total) by mouth Every 6 hours as needed for Muscle spasms       Ty Hilts, CNP  [address] [place] [address]  [phone]    In 2 days  As needed, If symptoms worsen      Medications Administered in the ED   NS flush syringe (has no administration in time range)   NS flush syringe (has no administration in time range)   alcohol 62 % (NOZIN NASAL SANITIZER) nasal swab packet (has no administration in time range)   cyclobenzaprine (FLEXERIL) tablet (has no administration in time range)   GI cocktail (ANTACID SUSP, HYOSCYAMINE, LIDOCAINE) oral  solution (55 mL Oral Given 04/20/23 2300)       Following the history, physical exam, and ED workup, the patient was deemed stable and suitable for discharge. The patient/caregiver was advised to return to the ED for any new or worsening symptoms. Discharge medications, and follow-up instructions were discussed with the patient/caregiver in detail, who verbalizes understanding. The patient/caregiver is in agreement and is comfortable with the plan of care.    Disposition: Discharged  Current Discharge Medication List        START taking these medications.        Details   chlorzoxazone 500 mg Tablet  Commonly known as: PARAFON FORTE   500 mg, Oral, EVERY 6 HOURS PRN  Qty: 10 Tablet  Refills: 0            CONTINUE these medications - NO CHANGES were made during your visit.        Details   albuterol sulfate 90 mcg/actuation oral inhaler  Commonly known as: PROVENTIL or VENTOLIN or PROAIR   1-2 Puffs, Inhalation, EVERY 6 HOURS PRN  Refills: 0     amLODIPine 10 mg Tablet  Commonly known as: NORVASC   10 mg, Oral, DAILY  Refills: 0     aspirin 81 mg Tablet, Delayed Release (E.C.)  Commonly known as: ECOTRIN   81 mg, Oral, DAILY  Refills: 0     ATORVASTATIN ORAL   80 mg, Oral, DAILY  Refills: 0     azithromycin 500 mg Tablet  Commonly known as: ZITHROMAX   500 mg, Oral, EVERY 24 HOURS  Qty: 6 Tablet  Refills: 0     Banophen 50 mg Capsule  Generic drug: diphenhydrAMINE   TAKE 1 CAPSULE BY MOUTH EVERY 6 HOURS AS NEEDED FOR UP TO 7 DAYS (GENERIC FOR BENADRYL)  Refills: 0     bethanechol chloride 10 mg Tablet  Commonly known as: URECHOLINE   5 mg, Oral, DAILY  Refills: 0     clobetasoL 0.05 % Ointment  Commonly known as: TEMOVATE   APPLY TO THE AFFECTED AREA(S) TWICE DAILY FOR TWO WEEKS; STOP FOR two WEEKS THEN USE AS NEEDED FOR itch. do not USE ON face, groin, OR long term.  Refills: 0     clopidogreL 75 mg Tablet  Commonly known as: PLAVIX   75 mg, Oral, DAILY  Refills: 0     COMBIVENT INHL   1 Puff, Inhalation, 4  TIMES DAILY PRN  Refills: 0     cyanocobalamin 1,000 mcg/mL Solution  Commonly known as: VITAMIN B12   inject 1ml SUBCUTANEOUSLY EVERY A MONTH  Refills: 0     cyclobenzaprine 10 mg Tablet  Commonly known as: FLEXERIL   10 mg, Oral, 2 TIMES DAILY  Refills: 0     divalproex 250 mg Tablet Sustained Release 24 hr  Commonly known as: DEPAKOTE ER   250 mg  Refills: 0     docusate sodium 100 mg Capsule  Commonly known as: COLACE   100 mg by oral route.  Refills: 0     DULoxetine 30 mg Capsule, Delayed Release(E.C.)  Commonly known as: CYMBALTA DR   30 mg, Oral, 2 TIMES DAILY  Refills: 0     ergocalciferol (vitamin D2) 1,250 mcg (50,000 unit) Capsule  Commonly known as: DRISDOL   50,000 Units, Oral, EVERY 7 DAYS  Refills: 0     famotidine 20 mg Tablet  Commonly known as: PEPCID   20 mg, Oral, DAILY  Refills: 0     furosemide 20 mg Tablet  Commonly known as: LASIX   20 mg, Oral, DAILY PRN  Refills: 0     gabapentin 400 mg Capsule  Commonly known as: NEURONTIN   400 mg, Oral, 3 TIMES DAILY  Refills: 0     HUMALOG KWIKPEN INSULIN SUBQ   Subcutaneous, 7-20 units tid before meals  Refills: 0     hydrOXYzine pamoate 25 mg Capsule  Commonly known as:  VISTARIL   50 mg, Oral, DAILY PRN  Refills: 0     ketorolac tromethamine 10 mg Tablet  Commonly known as: TORADOL   10 mg, Oral, EVERY 6 HOURS PRN  Qty: 15 Tablet  Refills: 0     Levemir FlexPen 100 unit/mL (3 mL) Insulin Pen  Generic drug: insulin detemir U-100   60 Units, Subcutaneous, DAILY PRN, (SLIDING SCALE)  Refills: 0     lisinopriL 20 mg Tablet  Commonly known as: PRINIVIL   20 mg, Oral, DAILY  Refills: 0     loratadine 10 mg Tablet  Commonly known as: CLARITIN   10 mg, Oral, DAILY PRN  Refills: 0     MetFORMIN 1,000 mg Tablet  Commonly known as: GLUCOPHAGE   1,000 mg, Oral, 2 TIMES DAILY WITH FOOD  Refills: 0     metoclopramide 100 mcg/mL Solution  Commonly known as: REGLAN   5 mL, Oral, 3 TIMES DAILY BEFORE MEALS  Refills: 0     metoprolol succinate 50 mg Tablet Sustained  Release 24 hr  Commonly known as: TOPROL-XL   50 mg, Oral, DAILY  Refills: 0     omeprazole 40 mg Capsule, Delayed Release(E.C.)  Commonly known as: PRILOSEC   40 mg, Oral, DAILY  Refills: 0     ondansetron 4 mg Tablet, Rapid Dissolve  Commonly known as: ZOFRAN ODT   4 mg, Oral, EVERY 8 HOURS PRN  Qty: 12 Tablet  Refills: 0     rOPINIRole 1 mg Tablet  Commonly known as: REQUIP   1 mg, Oral, 3 TIMES DAILY  Refills: 0     tiZANidine 4 mg Tablet  Commonly known as: ZANAFLEX   4 mg, Oral, 3 TIMES DAILY  Qty: 20 Tablet  Refills: 0     traMADoL 50 mg Tablet  Commonly known as: ULTRAM   50 mg, Oral, EVERY 6 HOURS PRN  Qty: 20 Tablet  Refills: 0     traZODone 100 mg Tablet  Commonly known as: DESYREL   100 mg, Oral, NIGHTLY  Refills: 0     Trulicity 0.75 mg/0.5 mL Pen Injector  Generic drug: dulaglutide   0.75 mg, Subcutaneous, EVERY 7 DAYS  Refills: 0     verapamiL 40 mg Tablet  Commonly known as: CALAN   1 Tablet, Oral, DAILY PRN  Refills: 0            Follow up:   Ty Hilts, CNP  [address] [place] [address]  [phone]    In 2 days  As needed, If symptoms worsen      Clinical Impression   Left-sided chest wall pain (Primary)         Current Discharge Medication List        START taking these medications    Details   chlorzoxazone (PARAFON FORTE) 500 mg Oral Tablet Take 1 Tablet (500 mg total) by mouth Every 6 hours as needed for Muscle spasms  Qty: 10 Tablet, Refills: 0             Portions of this chart may have been created with M*Modal voice recognition software.  Occasional 1 word or "sound like" substitutions may have occurred due to the inherent limitations of voice recognition software.  Please read the chart carefully and recognize, using context, where the substitutions have occurred.

## 2023-04-20 NOTE — ED Nurses Note (Signed)
Patient states that chest pain started yesterday and radiated down to the left side of her abdomen rates pain 10/10. Patient states that she has hx of CHF, DM, Hypertension. Takes a beta blocker, and aspirin daily. Patient states that she did not take aspirin today.

## 2023-04-20 NOTE — Discharge Instructions (Addendum)
Thankfully all of your tests today were negative including:  1. Your white blood cell count which was normal with no sign of infection  2. Your red cell count which was normal with no sign of anemia  3. Your electrolytes were normal  4. Your kidney function tests were normal  5. Your EKG was normal with no signs of a heart attack or other cardiac problems  6. Your heart enzyme troponin was negative with no signs of a heart attack  7. Your chest x-ray was negative with no signs of pneumonia or any other abnormality    Please follow up with your PCP.  Your symptoms today are probably due to chest wall strain.  You will be treated with a muscle relaxant.  Please return for worsening symptoms like increased chest pain and/or shortness of breath.

## 2023-04-21 NOTE — ED Nurses Note (Signed)
 Patient discharged home with family.  AVS reviewed with patient/care giver.  A written copy of the AVS and discharge instructions was given to the patient/care giver. Scripts handed to patient/care giver. Questions sufficiently answered as needed.  Patient/care giver encouraged to follow up with PCP as indicated.  In the event of an emergency, patient/care giver instructed to call 911 or go to the nearest emergency room.

## 2023-04-22 DIAGNOSIS — R000 Tachycardia, unspecified: Secondary | ICD-10-CM

## 2023-04-22 DIAGNOSIS — R9431 Abnormal electrocardiogram [ECG] [EKG]: Secondary | ICD-10-CM

## 2023-04-22 DIAGNOSIS — R079 Chest pain, unspecified: Secondary | ICD-10-CM

## 2023-04-22 LAB — ECG 12 LEAD
Atrial Rate: 122 {beats}/min
Calculated P Axis: 45 degrees
Calculated R Axis: 0 degrees
Calculated T Axis: 64 degrees
PR Interval: 168 ms
QRS Duration: 76 ms
QT Interval: 308 ms
QTC Calculation: 438 ms
Ventricular rate: 122 {beats}/min

## 2023-04-27 ENCOUNTER — Encounter (HOSPITAL_BASED_OUTPATIENT_CLINIC_OR_DEPARTMENT_OTHER): Payer: Self-pay

## 2023-04-27 ENCOUNTER — Emergency Department
Admission: EM | Admit: 2023-04-27 | Discharge: 2023-04-27 | Disposition: A | Discharge status: Home / Self Care | Payer: Medicare Other | Source: Home / Self Care | Attending: Emergency Medicine | Admitting: Emergency Medicine

## 2023-04-27 ENCOUNTER — Other Ambulatory Visit: Payer: Self-pay

## 2023-04-27 DIAGNOSIS — R109 Unspecified abdominal pain: Secondary | ICD-10-CM | POA: Insufficient documentation

## 2023-04-27 DIAGNOSIS — Z1152 Encounter for screening for COVID-19: Secondary | ICD-10-CM | POA: Insufficient documentation

## 2023-04-27 DIAGNOSIS — R112 Nausea with vomiting, unspecified: Secondary | ICD-10-CM | POA: Insufficient documentation

## 2023-04-27 LAB — COMPREHENSIVE METABOLIC PANEL, NON-FASTING
ALBUMIN/GLOBULIN RATIO: 0.8 (ref 0.8–1.4)
ALBUMIN: 2.7 g/dL — ABNORMAL LOW (ref 3.4–5.0)
ALKALINE PHOSPHATASE: 116 U/L (ref 46–116)
ALT (SGPT): 69 U/L (ref <=78)
ANION GAP: 5 mmol/L (ref 4–13)
AST (SGOT): 63 U/L — ABNORMAL HIGH (ref 15–37)
BILIRUBIN TOTAL: 0.3 mg/dL (ref 0.2–1.0)
BUN/CREA RATIO: 11
BUN: 12 mg/dL (ref 7–18)
CALCIUM, CORRECTED: 10.1 mg/dL
CALCIUM: 9.1 mg/dL (ref 8.5–10.1)
CHLORIDE: 105 mmol/L (ref 98–107)
CO2 TOTAL: 29 mmol/L (ref 21–32)
CREATININE: 1.12 mg/dL — ABNORMAL HIGH (ref 0.55–1.02)
ESTIMATED GFR: 57 mL/min/{1.73_m2} — ABNORMAL LOW (ref >59)
GLOBULIN: 3.6
GLUCOSE: 82 mg/dL (ref 74–106)
OSMOLALITY, CALCULATED: 276 mosm/kg (ref 270–290)
POTASSIUM: 3.8 mmol/L (ref 3.5–5.1)
PROTEIN TOTAL: 6.3 g/dL — ABNORMAL LOW (ref 6.4–8.2)
SODIUM: 139 mmol/L (ref 136–145)

## 2023-04-27 LAB — CBC WITH DIFF
BASOPHIL #: 0.01 10*3/uL (ref 0.00–0.10)
BASOPHIL %: 0 % (ref 0–1)
EOSINOPHIL #: 0.05 10*3/uL (ref 0.00–0.50)
EOSINOPHIL %: 1 % (ref 1–7)
HCT: 34.5 % (ref 31.2–41.9)
HGB: 10.5 g/dL — ABNORMAL LOW (ref 10.9–14.3)
LYMPHOCYTE #: 1.16 10*3/uL (ref 1.10–3.10)
LYMPHOCYTE %: 32 % (ref 16–46)
MCH: 26.2 pg (ref 24.7–32.8)
MCHC: 30.6 g/dL — ABNORMAL LOW (ref 32.3–35.6)
MCV: 85.7 fL (ref 75.5–95.3)
MONOCYTE #: 0.46 10*3/uL (ref 0.20–0.90)
MONOCYTE %: 13 % — ABNORMAL HIGH (ref 4–11)
MPV: 7.3 fL — ABNORMAL LOW (ref 7.9–10.8)
NEUTROPHIL #: 1.93 10*3/uL (ref 1.90–8.20)
NEUTROPHIL %: 54 % (ref 43–77)
PLATELETS: 247 10*3/uL (ref 140–440)
RBC: 4.03 10*6/uL (ref 3.63–4.92)
RDW: 18.8 % — ABNORMAL HIGH (ref 12.3–17.7)
WBC: 3.6 10*3/uL — ABNORMAL LOW (ref 3.8–11.8)

## 2023-04-27 LAB — COVID-19, FLU A/B, RSV RAPID BY PCR
INFLUENZA VIRUS TYPE A: NOT DETECTED
INFLUENZA VIRUS TYPE B: NOT DETECTED
RESPIRATORY SYNCTIAL VIRUS (RSV): NOT DETECTED
SARS-CoV-2: NOT DETECTED

## 2023-04-27 LAB — MAGNESIUM: MAGNESIUM: 1.6 mg/dL — ABNORMAL LOW (ref 1.8–2.4)

## 2023-04-27 LAB — LIPASE: LIPASE: 13 U/L — ABNORMAL LOW (ref 16–77)

## 2023-04-27 MED ORDER — MAGNESIUM SULFATE 1 GRAM/100 ML IN DEXTROSE 5 % INTRAVENOUS PIGGYBACK
1.0000 g | INJECTION | Freq: Once | INTRAVENOUS | Status: AC
Start: 2023-04-27 — End: 2023-04-27
  Administered 2023-04-27: 1 g via INTRAVENOUS
  Administered 2023-04-27: 0 g via INTRAVENOUS

## 2023-04-27 MED ORDER — ACETAMINOPHEN 325 MG TABLET
975.0000 mg | ORAL_TABLET | ORAL | Status: AC
Start: 2023-04-27 — End: 2023-04-27
  Administered 2023-04-27: 975 mg via ORAL

## 2023-04-27 MED ORDER — HEPARIN LOCK FLUSH (PORCINE) 100 UNIT/ML INTRAVENOUS SOLUTION
INTRAVENOUS | Status: AC
Start: 2023-04-27 — End: 2023-04-27
  Filled 2023-04-27: qty 5

## 2023-04-27 MED ORDER — ONDANSETRON 4 MG DISINTEGRATING TABLET
4.0000 mg | ORAL_TABLET | Freq: Three times a day (TID) | ORAL | 0 refills | Status: DC | PRN
Start: 2023-04-27 — End: 2023-12-19

## 2023-04-27 MED ORDER — DIPHENHYDRAMINE 50 MG/ML INJECTION SOLUTION
INTRAMUSCULAR | Status: AC
Start: 2023-04-27 — End: 2023-04-27
  Filled 2023-04-27: qty 1

## 2023-04-27 MED ORDER — ACETAMINOPHEN 325 MG TABLET
ORAL_TABLET | ORAL | Status: AC
Start: 2023-04-27 — End: 2023-04-27
  Filled 2023-04-27: qty 3

## 2023-04-27 MED ORDER — SODIUM CHLORIDE 0.9 % IV BOLUS
1000.0000 mL | INJECTION | Status: AC
Start: 2023-04-27 — End: 2023-04-27
  Administered 2023-04-27: 1000 mL via INTRAVENOUS
  Administered 2023-04-27: 0 mL via INTRAVENOUS

## 2023-04-27 MED ORDER — PANTOPRAZOLE 40 MG INTRAVENOUS SOLUTION
40.0000 mg | INTRAVENOUS | Status: AC
Start: 2023-04-27 — End: 2023-04-27
  Administered 2023-04-27: 40 mg via INTRAVENOUS

## 2023-04-27 MED ORDER — DIPHENHYDRAMINE 50 MG/ML INJECTION SOLUTION
12.5000 mg | INTRAMUSCULAR | Status: AC
Start: 2023-04-27 — End: 2023-04-27
  Administered 2023-04-27: 12.5 mg via INTRAVENOUS

## 2023-04-27 MED ORDER — PROCHLORPERAZINE EDISYLATE 10 MG/2 ML (5 MG/ML) INJECTION SOLUTION
10.0000 mg | INTRAMUSCULAR | Status: AC
Start: 2023-04-27 — End: 2023-04-27
  Administered 2023-04-27: 10 mg via INTRAVENOUS

## 2023-04-27 MED ORDER — MAGNESIUM SULFATE 1 GRAM/100 ML IN DEXTROSE 5 % INTRAVENOUS PIGGYBACK
INJECTION | INTRAVENOUS | Status: AC
Start: 2023-04-27 — End: 2023-04-27
  Filled 2023-04-27: qty 100

## 2023-04-27 MED ORDER — SODIUM CHLORIDE 0.9 % IV BOLUS
250.0000 mL | INJECTION | Status: AC
Start: 2023-04-27 — End: 2023-04-27
  Administered 2023-04-27: 250 mL via INTRAVENOUS
  Administered 2023-04-27: 0 mL via INTRAVENOUS

## 2023-04-27 MED ORDER — PROCHLORPERAZINE EDISYLATE 10 MG/2 ML (5 MG/ML) INJECTION SOLUTION
INTRAMUSCULAR | Status: AC
Start: 2023-04-27 — End: 2023-04-27
  Filled 2023-04-27: qty 2

## 2023-04-27 MED ORDER — BUPRENORPHINE HCL 0.3 MG/ML INJECTION SOLUTION
0.3000 mg | Freq: Once | INTRAMUSCULAR | Status: AC
Start: 2023-04-27 — End: 2023-04-27
  Administered 2023-04-27: 0.3 mg via INTRAVENOUS

## 2023-04-27 MED ORDER — PANTOPRAZOLE 40 MG INTRAVENOUS SOLUTION
INTRAVENOUS | Status: AC
Start: 2023-04-27 — End: 2023-04-27
  Filled 2023-04-27: qty 10

## 2023-04-27 MED ORDER — BUPRENORPHINE HCL 0.3 MG/ML INJECTION SOLUTION
INTRAMUSCULAR | Status: AC
Start: 2023-04-27 — End: 2023-04-27
  Filled 2023-04-27: qty 1

## 2023-04-27 NOTE — ED Nurses Note (Signed)
Patient discharged home with family.  AVS reviewed with patient/care giver.  A written copy of the AVS and discharge instructions was given to the patient/care giver. Script escribed to preferred pharmacy. Questions sufficiently answered as needed.  Patient/care giver encouraged to follow up with PCP as indicated.  In the event of an emergency, patient/care giver instructed to call 911 or go to the nearest emergency room.

## 2023-04-27 NOTE — ED Provider Notes (Addendum)
[facility] emergency department         HISTORY OF PRESENT ILLNESS     Date:  04/27/2023  Patient's Name:  Martha White  Date of Birth:  1965-09-08    Patient is a 58 year old presenting with complaint of nausea and vomiting for the last 8-12 hours overnight.  She denies any diarrhea.  She has had body aches no rash no sore throat.  Patient unable to keep anything down overnight.  Patient denies any diarrhea.  There has been no fever.        Review of Systems     Review of Systems   HENT: Negative.     Cardiovascular: Negative.    Gastrointestinal:  Positive for nausea and vomiting.   Musculoskeletal:  Positive for myalgias.   Hematological: Negative.    Psychiatric/Behavioral: Negative.     All other systems reviewed and are negative.      Previous History     Past Medical History:  Past Medical History:   Diagnosis Date    Arthropathy     Asthma     Carpal tunnel syndrome     Carpal tunnel syndrome     Congestive heart failure (CMS HCC)     COPD (chronic obstructive pulmonary disease) (CMS HCC)     Diabetes mellitus, type 2 (CMS HCC)     Fibromyalgia     Gastroparesis     Headache     HTN (hypertension)     Lumbar herniated disc     Neuropathy (CMS HCC)     Restless legs syndrome (RLS)     Thyroid disease     TIA (transient ischemic attack)     TIA (transient ischemic attack)        Past Surgical History:  Past Surgical History:   Procedure Laterality Date    Hx cholecystectomy      Hx hernia repair      Hx hernia repair      Hx hysterectomy      Hx rotator cuff repair      Stomach surgery         Social History:  Social History     Tobacco Use    Smoking status: Never    Smokeless tobacco: Never   Vaping Use    Vaping status: Never Used   Substance Use Topics    Alcohol use: Never    Drug use: Never     Social History     Substance and Sexual Activity   Drug Use Never       Family History:  No family history on file.    Medication History:  Current Outpatient Medications    Medication Sig    albuterol sulfate (PROVENTIL OR VENTOLIN OR PROAIR) 90 mcg/actuation Inhalation oral inhaler Take 1-2 Puffs by inhalation Every 6 hours as needed    amLODIPine (NORVASC) 10 mg Oral Tablet Take 1 Tablet (10 mg total) by mouth Once a day    aspirin (ECOTRIN) 81 mg Oral Tablet, Delayed Release (E.C.) Take 1 Tablet (81 mg total) by mouth Once a day    atorvastatin calcium (ATORVASTATIN ORAL) Take 80 mg by mouth Once a day    azithromycin (ZITHROMAX) 500 mg Oral Tablet Take 1 Tablet (500 mg total) by mouth Every 24 hours    bethanechol chloride (URECHOLINE) 10 mg Oral Tablet Take 0.5 Tablets (5 mg total) by mouth Once a day    chlorzoxazone (PARAFON FORTE) 500 mg  Oral Tablet Take 1 Tablet (500 mg total) by mouth Every 6 hours as needed for Muscle spasms    clobetasoL (TEMOVATE) 0.05 % Ointment APPLY TO THE AFFECTED AREA(S) TWICE DAILY FOR TWO WEEKS; STOP FOR two WEEKS THEN USE AS NEEDED FOR itch. do not USE ON face, groin, OR long term.    clopidogreL (PLAVIX) 75 mg Oral Tablet Take 1 Tablet (75 mg total) by mouth Once a day    cyanocobalamin (VITAMIN B12) 1,000 mcg/mL Injection Solution inject 1ml SUBCUTANEOUSLY EVERY A MONTH    cyclobenzaprine (FLEXERIL) 10 mg Oral Tablet Take 1 Tablet (10 mg total) by mouth Twice daily    diphenhydrAMINE (BANOPHEN) 50 mg Oral Capsule TAKE 1 CAPSULE BY MOUTH EVERY 6 HOURS AS NEEDED FOR UP TO 7 DAYS (GENERIC FOR BENADRYL)    divalproex (DEPAKOTE ER) 250 mg Oral Tablet Sustained Release 24 hr Take 1 Tablet (250 mg total) by mouth (Patient not taking: Reported on 01/29/2023)    docusate sodium (COLACE) 100 mg Oral Capsule 100 mg by oral route.    dulaglutide (TRULICITY) 0.75 mg/0.5 mL Subcutaneous Pen Injector Inject 0.5 mL (0.75 mg total) under the skin Every 7 days    DULoxetine (CYMBALTA DR) 30 mg Oral Capsule, Delayed Release(E.C.) Take 1 Capsule (30 mg total) by mouth Twice daily    ergocalciferol, vitamin D2, (DRISDOL) 1,250 mcg (50,000 unit) Oral Capsule Take 1  Capsule (50,000 Units total) by mouth Every 7 days    famotidine (PEPCID) 20 mg Oral Tablet Take 1 Tablet (20 mg total) by mouth Once a day    furosemide (LASIX) 20 mg Oral Tablet Take 1 Tablet (20 mg total) by mouth Once per day as needed    gabapentin (NEURONTIN) 400 mg Oral Capsule Take 1 Capsule (400 mg total) by mouth Three times a day    hydrOXYzine pamoate (VISTARIL) 25 mg Oral Capsule Take 2 Capsules (50 mg total) by mouth Once per day as needed    insulin detemir U-100 (LEVEMIR FLEXPEN) 100 unit/mL (3 mL) Subcutaneous Insulin Pen Inject 60 Units under the skin Once per day as needed (SLIDING SCALE)    insulin lispro (HUMALOG KWIKPEN INSULIN SUBQ) Inject under the skin 7-20 units tid before meals    ipratropium/albuterol sulfate (COMBIVENT INHL) Take 1 Puff by inhalation Four times a day as needed    ketorolac tromethamine (TORADOL) 10 mg Oral Tablet Take 1 Tablet (10 mg total) by mouth Every 6 hours as needed for Pain    lisinopriL (PRINIVIL) 20 mg Oral Tablet Take 1 Tablet (20 mg total) by mouth Once a day    loratadine (CLARITIN) 10 mg Oral Tablet Take 1 Tablet (10 mg total) by mouth Once per day as needed    MetFORMIN (GLUCOPHAGE) 1,000 mg Oral Tablet Take 1 Tablet (1,000 mg total) by mouth Twice daily with food    metoclopramide (REGLAN) 100 mcg/mL Oral Solution Take 5 mL (500 mcg total) by mouth Three times daily before meals    metoprolol succinate (TOPROL-XL) 50 mg Oral Tablet Sustained Release 24 hr Take 1 Tablet (50 mg total) by mouth Once a day    omeprazole (PRILOSEC) 40 mg Oral Capsule, Delayed Release(E.C.) Take 1 Capsule (40 mg total) by mouth Once a day    ondansetron (ZOFRAN ODT) 4 mg Oral Tablet, Rapid Dissolve Take 1 Tablet (4 mg total) by mouth Every 8 hours as needed for Nausea/Vomiting    rOPINIRole (REQUIP) 1 mg Oral Tablet Take 1 Tablet (1 mg total) by  mouth Three times a day    tiZANidine (ZANAFLEX) 4 mg Oral Tablet Take 1 Tablet (4 mg total) by mouth Three times a day (Patient  taking differently: Take 1 Tablet (4 mg total) by mouth Three times a day as needed)    traMADoL (ULTRAM) 50 mg Oral Tablet Take 1 Tablet (50 mg total) by mouth Every 6 hours as needed for Pain    traZODone (DESYREL) 100 mg Oral Tablet Take 1 Tablet (100 mg total) by mouth Every night    verapamiL (CALAN) 40 mg Oral Tablet Take 1 Tablet (40 mg total) by mouth Once per day as needed (HEADACHE)       Allergies:  Allergies   Allergen Reactions    Rubbing Alcohol (Ethanol) [Ethyl Alcohol] Shortness of Breath    Sulfa (Sulfonamides) Anaphylaxis    Ibuprofen     Influenza Virus Vaccines     Influenza Virus Vaccine, Specific Hives/ Urticaria    Penicillins Hives/ Urticaria    Pneumococcal Vaccine Hives/ Urticaria       Physical Exam     Vitals:    BP (!) 149/95   Pulse 99   Temp 36 C (96.8 F)   Resp 18   Ht 1.499 m (4\' 11" )   Wt 68 kg (150 lb)   SpO2 99%   BMI 30.30 kg/m           Physical Exam  Vitals and nursing note reviewed. Exam conducted with a chaperone present.   Constitutional:       General: She is not in acute distress.     Appearance: Normal appearance. She is well-developed and normal weight.   HENT:      Head: Normocephalic and atraumatic.      Right Ear: Tympanic membrane normal.      Left Ear: Tympanic membrane normal.      Mouth/Throat:      Mouth: Mucous membranes are moist.   Eyes:      Extraocular Movements: Extraocular movements intact.      Conjunctiva/sclera: Conjunctivae normal.      Pupils: Pupils are equal, round, and reactive to light.   Cardiovascular:      Rate and Rhythm: Normal rate and regular rhythm.      Pulses: Normal pulses.      Heart sounds: No murmur heard.  Pulmonary:      Effort: Pulmonary effort is normal. No respiratory distress.      Breath sounds: Normal breath sounds.   Abdominal:      General: Bowel sounds are normal.      Palpations: Abdomen is soft.      Tenderness: There is no abdominal tenderness.   Musculoskeletal:         General: No swelling. Normal range of  motion.      Cervical back: Neck supple.   Skin:     General: Skin is warm and dry.      Capillary Refill: Capillary refill takes less than 2 seconds.   Neurological:      General: No focal deficit present.      Mental Status: She is alert and oriented to person, place, and time.   Psychiatric:         Mood and Affect: Mood normal.         Diagnostic Studies/Treatment     Medications:  Medications Administered in the ED   NS bolus infusion 250 mL (has no administration in time range)   magnesium sulfate 1 G  in D5W 100 mL premix IVPB (has no administration in time range)   NS bolus infusion 1,000 mL (1,000 mL Intravenous New Bag/New Syringe 04/27/23 0700)   prochlorperazine (COMPAZINE) 5 mg/mL injection (10 mg Intravenous Given 04/27/23 0700)   pantoprazole (PROTONIX) 4 mg/mL injection (40 mg Intravenous Given 04/27/23 0700)   diphenhydrAMINE (BENADRYL) 50 mg/mL injection (12.5 mg Intravenous Given 04/27/23 0700)   buprenorphine (BUPRENEX) 0.3 mg/mL injection (0.3 mg Intravenous Given 04/27/23 0700)       New Prescriptions    No medications on file       Labs:    Results for orders placed or performed during the hospital encounter of 04/27/23 (from the past 12 hours)   COVID-19, FLU A/B, RSV RAPID BY PCR   Result Value Ref Range    SARS-CoV-2 Not Detected Not Detected    INFLUENZA VIRUS TYPE A Not Detected Not Detected    INFLUENZA VIRUS TYPE B Not Detected Not Detected    RESPIRATORY SYNCTIAL VIRUS (RSV) Not Detected Not Detected   CBC WITH DIFF   Result Value Ref Range    WBC 3.6 (L) 3.8 - 11.8 x10^3/uL    RBC 4.03 3.63 - 4.92 x10^6/uL    HGB 10.5 (L) 10.9 - 14.3 g/dL    HCT 47.8 29.5 - 62.1 %    MCV 85.7 75.5 - 95.3 fL    MCH 26.2 24.7 - 32.8 pg    MCHC 30.6 (L) 32.3 - 35.6 g/dL    RDW 30.8 (H) 65.7 - 17.7 %    PLATELETS 247 140 - 440 x10^3/uL    MPV 7.3 (L) 7.9 - 10.8 fL    NEUTROPHIL % 54 43 - 77 %    LYMPHOCYTE % 32 16 - 46 %    MONOCYTE % 13 (H) 4 - 11 %    EOSINOPHIL % 1 1 - 7 %    BASOPHIL % 0 0 - 1 %    NEUTROPHIL #  1.93 1.90 - 8.20 x10^3/uL    LYMPHOCYTE # 1.16 1.10 - 3.10 x10^3/uL    MONOCYTE # 0.46 0.20 - 0.90 x10^3/uL    EOSINOPHIL # 0.05 0.00 - 0.50 x10^3/uL    BASOPHIL # 0.01 0.00 - 0.10 x10^3/uL   COMPREHENSIVE METABOLIC PANEL, NON-FASTING   Result Value Ref Range    SODIUM 139 136 - 145 mmol/L    POTASSIUM 3.8 3.5 - 5.1 mmol/L    CHLORIDE 105 98 - 107 mmol/L    CO2 TOTAL 29 21 - 32 mmol/L    ANION GAP 5 4 - 13 mmol/L    BUN 12 7 - 18 mg/dL    CREATININE 8.46 (H) 0.55 - 1.02 mg/dL    BUN/CREA RATIO 11     ESTIMATED GFR 57 (L) >59 mL/min/1.60m^2    ALBUMIN 2.7 (L) 3.4 - 5.0 g/dL    CALCIUM 9.1 8.5 - 96.2 mg/dL    GLUCOSE 82 74 - 952 mg/dL    ALKALINE PHOSPHATASE 116 46 - 116 U/L    ALT (SGPT) 69 <=78 U/L    AST (SGOT) 63 (H) 15 - 37 U/L    BILIRUBIN TOTAL 0.3 0.2 - 1.0 mg/dL    PROTEIN TOTAL 6.3 (L) 6.4 - 8.2 g/dL    ALBUMIN/GLOBULIN RATIO 0.8 0.8 - 1.4    OSMOLALITY, CALCULATED 276 270 - 290 mOsm/kg    CALCIUM, CORRECTED 10.1 mg/dL    GLOBULIN 3.6    LIPASE   Result Value Ref Range  LIPASE 13 (L) 16 - 77 U/L   MAGNESIUM   Result Value Ref Range    MAGNESIUM 1.6 (L) 1.8 - 2.4 mg/dL        Radiology:  None    No orders to display       ECG:  NONE            Differential diagnosis  Nausea and vomiting, gastroenteritis, viral syndrome, influenza, COVID-19    Course/Disposition/Plan     Course:      Patient will we will receive IV fluids antiemetics we will be tested for COVID-19 and flu  Disposition:    Discharged    Condition at Disposition:   Stable    Follow up:   No follow-up provider specified.    Clinical Impression:     Clinical Impression   Nausea and vomiting (Primary)   Abdominal pain, unspecified abdominal location         Tivis Ringer, MD

## 2023-04-27 NOTE — ED Nurses Note (Signed)
Port flushed with heparin flush prior to de-access.
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
## 2023-05-24 ENCOUNTER — Other Ambulatory Visit: Payer: Self-pay

## 2023-05-24 ENCOUNTER — Emergency Department
Admission: EM | Admit: 2023-05-24 | Discharge: 2023-05-24 | Disposition: A | Discharge status: Home / Self Care | Source: Home / Self Care

## 2023-05-24 ENCOUNTER — Encounter (HOSPITAL_BASED_OUTPATIENT_CLINIC_OR_DEPARTMENT_OTHER): Payer: Self-pay

## 2023-05-24 ENCOUNTER — Emergency Department (HOSPITAL_BASED_OUTPATIENT_CLINIC_OR_DEPARTMENT_OTHER)

## 2023-05-24 DIAGNOSIS — G43909 Migraine, unspecified, not intractable, without status migrainosus: Secondary | ICD-10-CM

## 2023-05-24 DIAGNOSIS — D649 Anemia, unspecified: Secondary | ICD-10-CM

## 2023-05-24 DIAGNOSIS — H1132 Conjunctival hemorrhage, left eye: Secondary | ICD-10-CM

## 2023-05-24 DIAGNOSIS — R9431 Abnormal electrocardiogram [ECG] [EKG]: Secondary | ICD-10-CM | POA: Insufficient documentation

## 2023-05-24 LAB — MANUAL DIFFERENTIAL
BAND %: 2 % — ABNORMAL LOW (ref 5–11)
BANDS NEUTROPHILS MANUAL: 2
LYMPHOCYTE %: 45 % (ref 25–45)
LYMPHOCYTE ABSOLUTE: 1.17 10*3/uL (ref 1.10–5.00)
LYMPHOCYTES MANUAL: 45
MONOCYTE %: 11 % (ref 0–12)
MONOCYTE ABSOLUTE: 0.29 10*3/uL (ref 0.00–1.30)
MONOCYTES MANUAL: 11
NEUTROPHIL %: 42 % (ref 40–76)
NEUTROPHIL ABSOLUTE: 1.14 10*3/uL — ABNORMAL LOW (ref 1.80–8.40)
NEUTROPHILS MANUAL: 42
PLATELET MORPHOLOGY COMMENT: NORMAL
TOTAL CELLS COUNTED [#] IN BLOOD: 100
WBC: 2.6 10*3/uL

## 2023-05-24 LAB — CBC WITH DIFF
HCT: 26.5 % — ABNORMAL LOW (ref 31.2–41.9)
HGB: 8.5 g/dL — ABNORMAL LOW (ref 10.9–14.3)
MCH: 27.2 pg (ref 24.7–32.8)
MCHC: 32.1 g/dL — ABNORMAL LOW (ref 32.3–35.6)
MCV: 84.6 fL (ref 75.5–95.3)
MPV: 8 fL (ref 7.9–10.8)
PLATELETS: 195 10*3/uL (ref 140–440)
RBC: 3.13 10*6/uL — ABNORMAL LOW (ref 3.63–4.92)
RDW: 18.4 % — ABNORMAL HIGH (ref 12.3–17.7)
WBC: 2.6 10*3/uL — ABNORMAL LOW (ref 3.8–11.8)

## 2023-05-24 LAB — COMPREHENSIVE METABOLIC PANEL, NON-FASTING
ALBUMIN/GLOBULIN RATIO: 0.8 (ref 0.8–1.4)
ALBUMIN: 2.5 g/dL — ABNORMAL LOW (ref 3.4–5.0)
ALKALINE PHOSPHATASE: 103 U/L (ref 46–116)
ALT (SGPT): 37 U/L (ref <=78)
ANION GAP: 7 mmol/L (ref 4–13)
AST (SGOT): 25 U/L (ref 15–37)
BILIRUBIN TOTAL: 0.3 mg/dL (ref 0.2–1.0)
BUN/CREA RATIO: 15
BUN: 12 mg/dL (ref 7–18)
CALCIUM, CORRECTED: 9.3 mg/dL
CALCIUM: 8.1 mg/dL — ABNORMAL LOW (ref 8.5–10.1)
CHLORIDE: 108 mmol/L — ABNORMAL HIGH (ref 98–107)
CO2 TOTAL: 27 mmol/L (ref 21–32)
CREATININE: 0.79 mg/dL (ref 0.55–1.02)
ESTIMATED GFR: 86 mL/min/{1.73_m2} (ref >59)
GLOBULIN: 3.2
GLUCOSE: 73 mg/dL — ABNORMAL LOW (ref 74–106)
OSMOLALITY, CALCULATED: 282 mosm/kg (ref 270–290)
POTASSIUM: 3.7 mmol/L (ref 3.5–5.1)
PROTEIN TOTAL: 5.7 g/dL — ABNORMAL LOW (ref 6.4–8.2)
SODIUM: 142 mmol/L (ref 136–145)

## 2023-05-24 LAB — PTT (PARTIAL THROMBOPLASTIN TIME): APTT: 53 s — ABNORMAL HIGH (ref 25.0–38.0)

## 2023-05-24 LAB — PT/INR
INR: 1.09 (ref 0.84–1.10)
PROTHROMBIN TIME: 12.3 s (ref 9.8–12.7)

## 2023-05-24 LAB — OCCULT BLOOD (PRN ED USE ONLY): OCCULT BLOOD: NEGATIVE

## 2023-05-24 LAB — TROPONIN-I: TROPONIN I: <2 ng/L (ref <15)

## 2023-05-24 MED ORDER — DIPHENHYDRAMINE 50 MG/ML INJECTION SOLUTION
12.5000 mg | INTRAMUSCULAR | Status: AC
Start: 2023-05-24 — End: 2023-05-24
  Administered 2023-05-24: 12.5 mg via INTRAVENOUS

## 2023-05-24 MED ORDER — DIPHENHYDRAMINE 50 MG/ML INJECTION SOLUTION
INTRAMUSCULAR | Status: AC
Start: 2023-05-24 — End: 2023-05-24
  Filled 2023-05-24: qty 1

## 2023-05-24 MED ORDER — BUPRENORPHINE HCL 0.3 MG/ML INJECTION SOLUTION
0.1500 mg | INTRAMUSCULAR | Status: AC
Start: 2023-05-24 — End: 2023-05-24
  Administered 2023-05-24: 0.15 mg via INTRAVENOUS

## 2023-05-24 MED ORDER — METOCLOPRAMIDE 5 MG/ML INJECTION SOLUTION
INTRAMUSCULAR | Status: AC
Start: 2023-05-24 — End: 2023-05-24
  Filled 2023-05-24: qty 2

## 2023-05-24 MED ORDER — HEPARIN, PORCINE (PF) 100 UNIT/ML INTRAVENOUS SYRINGE
INJECTION | INTRAVENOUS | Status: AC
Start: 2023-05-24 — End: 2023-05-24
  Filled 2023-05-24: qty 5

## 2023-05-24 MED ORDER — IOHEXOL 350 MG IODINE/ML INTRAVENOUS SOLUTION
100.0000 mL | INTRAVENOUS | Status: AC
Start: 2023-05-24 — End: 2023-05-24
  Administered 2023-05-24: 100 mL via INTRAVENOUS

## 2023-05-24 MED ORDER — BUPRENORPHINE HCL 0.3 MG/ML INJECTION SOLUTION
INTRAMUSCULAR | Status: AC
Start: 2023-05-24 — End: 2023-05-24
  Filled 2023-05-24: qty 1

## 2023-05-24 MED ORDER — METOCLOPRAMIDE 5 MG/ML INJECTION SOLUTION
10.0000 mg | INTRAMUSCULAR | Status: AC
Start: 2023-05-24 — End: 2023-05-24
  Administered 2023-05-24: 10 mg via INTRAVENOUS

## 2023-05-24 MED ORDER — SODIUM CHLORIDE 0.9 % IV BOLUS
500.0000 mL | INJECTION | Status: AC
Start: 2023-05-24 — End: 2023-05-24
  Administered 2023-05-24: 0 mL via INTRAVENOUS
  Administered 2023-05-24: 500 mL via INTRAVENOUS

## 2023-05-24 NOTE — ED Nurses Note (Signed)
 NP Martha White to pt bedside regarding refusal for admission. Pt now states that "Oh that was a joke". Pt states that she will consent to admission at this time.

## 2023-05-24 NOTE — Discharge Instructions (Addendum)
 Please increase your clear liquids.  Follow up with your primary care provider Monday morning.  Follow up with Neurology, schedule your appointment.  Return to the emergency department if symptoms persist, worsen or new symptoms of concern develop.

## 2023-05-24 NOTE — ED Nurses Note (Signed)
 Pt with new orders to DC home. Ensured that dc note was updated by physician. Discussed dc wtih patient. Printed dc paperwork and provided pt wtih a copy, reading over important points to pt. All of the pt's questions answered at this time. Provided pt with clinic phone number for follow-up questions. Port de-accessed. Cath intact. pressure dressing applied. Bleeding controlled. Pt instructed by rm that dressing can be removed in 30 minutes. Pt transported off ward via ambulatory with friend.

## 2023-05-24 NOTE — ED Provider Notes (Signed)
 Emergency Medicine      Name: Paulett Kaufhold  Age and Gender: 58 y.o. female  Date of Birth: [birth year]  MRN: [id number]  PCP: Ty Hilts, CNP    CC:  Chief Complaint   Patient presents with    Head Pain     Patient reports that she was sitting watching TV and felt a sudden pain and "pop" in the left temporal area. She states that her vision is altered. Sclera of the left eye is reddened and she reports pain with movement of the eye and eyebrow.        HPI:  Ashritha Desrosiers is a 58 y.o. Black or [place] American female who presents to the ER with sudden onset of left-sided headache with left eye pain while watching television.  Patient reports feeling a sudden "pop" and pain in the left side of her head.  Left conjunctiva is extremely erythematous with patient reporting left eye pain and visual disturbance.  Left-sided facial asymmetry appreciated.  Pupils are equal and reactive.  Speech is clear.  Equal ROM and PMS all extremities.  No pronator drift.  Patient denies CP, SOB, LOC, N/V/D/C, chills or fever.  Patient reports left-sided headache 10/10.    Below pertinent information reviewed with patient:  Past Medical History:   Diagnosis Date    Arthropathy     Asthma     Carpal tunnel syndrome     Carpal tunnel syndrome     Congestive heart failure (CMS HCC)     COPD (chronic obstructive pulmonary disease) (CMS HCC)     Diabetes mellitus, type 2 (CMS HCC)     Fibromyalgia     Gastroparesis     Headache     HTN (hypertension)     Lumbar herniated disc     Neuropathy (CMS HCC)     Restless legs syndrome (RLS)     Thyroid disease     TIA (transient ischemic attack)     TIA (transient ischemic attack)            Allergies   Allergen Reactions    Rubbing Alcohol (Ethanol) [Ethyl Alcohol] Shortness of Breath    Sulfa (Sulfonamides) Anaphylaxis    Ibuprofen     Influenza Virus Vaccines     Influenza Virus Vaccine, Specific Hives/ Urticaria    Penicillins Hives/ Urticaria    Pneumococcal Vaccine Hives/ Urticaria        Past Surgical History:   Procedure Laterality Date    HX CHOLECYSTECTOMY      HX HERNIA REPAIR      HX HERNIA REPAIR      HX HYSTERECTOMY      HX ROTATOR CUFF REPAIR      PORTACATH INSERTION N/A 01/29/2023    Performed by Esmond Plants, MD at [facility]    STOMACH SURGERY      X4        Social History     Socioeconomic History    Marital status: Married   Tobacco Use    Smoking status: Never    Smokeless tobacco: Never   Vaping Use    Vaping status: Never Used   Substance and Sexual Activity    Alcohol use: Never    Drug use: Never    Sexual activity: Never       ROS:  No other overt positive review of systems are noted other than stated in the HPI.      Objective:    ED Triage  Vitals [05/24/23 1912]   BP (Non-Invasive) 133/85   Heart Rate (!) 102   Respiratory Rate 20   Temperature 36.9 C (98.4 F)   SpO2 100 %   Weight 68 kg (150 lb)   Height 1.499 m (4\' 11" )     Filed Vitals:    05/24/23 2130 05/24/23 2145 05/24/23 2200 05/24/23 2215   BP:   (!) 150/84 130/81   Pulse: (!) 112 (!) 107 (!) 112 (!) 104   Resp: (!) 23 16 20 16    Temp:       SpO2: 96% 99% 99% 95%       Nursing notes and vital signs reviewed.    Constitutional - Acute distress.  Alert and Active.  HEENT - Normocephalic. Atraumatic. PERRL. EOMI. Left conjunctiva erythematous. TM's pearly grey, translucent, without bulging or retraction. Oropharynx with no erythema, lesions, or exudates. Moist mucous membranes.   Neck - Trachea midline. No stridor. No hoarseness.  Cardiac - Regular rate and rhythm. No murmurs, rubs, or gallops. Intact distal pulses.  Respiratory/Chest - Normal respiratory effort. Clear to auscultation bilaterally. No rales, wheezes or rhonchi. No chest tenderness.  Abdomen - Normal bowel sounds. Non-tender, soft, non-distended. No rebound or guarding.   Musculoskeletal - Good AROM. No muscle or joint tenderness appreciated. No clubbing, cyanosis or edema.  Skin - Warm and dry, without any rashes or other lesions.  Neuro - Alert  and oriented x 3. Cranial nerves II-XII are grossly intact.  Moving all extremities symmetrically.  Psych - Tearful and anxious. Behavior is normal        Any pertinent labs and imaging obtained during this encounter reviewed below in MDM.    EKG:  Device interpretation:  Normal sinus rhythm; can not rule out anterior infarct, age undetermined; abnormal ECG.  Ventricular rate 95 bpm  PR interval 152 ms  QRS duration 72 ms  QT/QTC 330/414 ms    ED attending to reviewed EKG.      MDM/ED Course:    Medical Decision Making  Loredana Medellin is a 58 y.o. Black or [place] American female who presents to the ER with sudden onset of left-sided headache with left eye pain while watching television.  Patient reports feeling a sudden "pop" and pain in the left side of her head.  Left conjunctiva is extremely erythematous with patient reporting left eye pain and visual disturbance.  Left-sided facial asymmetry appreciated.  Pupils are equal and reactive.  Speech is clear.  Equal ROM and PMS all extremities.  No pronator drift.  Patient denies CP, SOB, LOC, N/V/D/C, chills or fever.  Patient reports left-sided headache 10/10.    Differential diagnosis include but are not limited to: Subconjunctival hemorrhage, CVA/TIA, migraine, electrolyte imbalance    Diagnostics will include labs, EKG and radiology    Please refer to ED course notes for additional documentation    Discussed with ED attending.     Treating for headache.  Will request admission for monitoring and further treatment d/t HA experienced and drop in Hgb. Will request admission per Hospitalist via secure Epic chat.    Discussed results and plan of care which include admission.  All questions answered per patient's satisfaction.  Patient indicated and verbalized understanding and agreement.    Continue to await reply from hospitalist.    Patient is adamantly refusing transfer or admission.  Despite best efforts to discuss consequences of this action that could include  further deterioration symptoms even to death.  Patient acknowledges these  possible consequences and continues to adamantly refuse admission or transfer.    Upon return to have AMA sign patient has rescinded her refusal for admission.  Updated hospitalist on her change in decision.  Awaiting reply.  Patient reports relief of headache at this time, rates pain 7/10.    Further discussed with hospitalist and ED attending.  Patient will be discharged to home with strict instructions to follow up primary care provider 1st thing Monday for repeat labs and further treatment as indicated.  Patient will also need to follow up with Neurology.  Patient to return to the emergency department if symptoms persist, worsen or new symptoms of concern develop.    Discussed results and plan of care which now include discharge to home.  Patient will follow up with primary care provider.  Patient will follow up with Neurology.  Patient will return to the emergency department if symptoms persist, worsen or new symptoms of concern develop.  Patient does not wish to wait on results of hemoccult stool.  Patient denies black, tarry or blood in stools.  All questions answered to patient and significant other satisfaction.  Patient indicated and verbalized understanding and agreement with this plan.    Amount and/or Complexity of Data Reviewed  Labs: ordered.  Radiology: ordered. Decision-making details documented in ED Course.  ECG/medicine tests: ordered and independent interpretation performed.    Risk  Prescription drug management.  Parenteral controlled substances.  Decision regarding hospitalization.              Critical care:   Critical Care Attestation:  As the ED nurse practitioner, I have provided critical care for this patient.  This patient has high probability of imminent life or limb threatening deterioration due to  acute onset unilateral headache, low hemoglobin, left eye pain with visual disturbance, left subconjunctival  hemorrhage .  I provided direct patient care, documentation of findings, review of medical records, and coordination of multidisciplinary care and frequent reassessment.  Time spent providing critical care (exclusive of any billed procedures and/or teaching): 45 minutes.    ED Course as of 05/24/23 2247   Fri May 24, 2023   2038 WBC(!): 2.6   2038 RBC(!): 3.13   2038 HGB(!): 8.5   2038 HCT(!): 26.5   2038 MCHC(!): 32.1   2038 RDW(!): 18.4   2038 BANDS(!): 2   2038 NEUTROPHIL ABSOLUTE(!): 1.14   2039 CHLORIDE(!): 108   2039 ALBUMIN(!): 2.5   2039 CALCIUM(!): 8.1   2039 GLUCOSE(!): 73   2039 TOTAL PROTEIN(!): 5.7   2039 TROPONIN-I: <2   2039 aPTT(!): 53.0   2039 CTA HEAD/NECK W/IV CONTRAST -  STROKE PROTOCOL  IMPRESSION:  1.RIGHT CAROTID: NEGATIVE FOR STENOSIS  2.LEFT CAROTID: NEGATIVE FOR STENOSIS  3.VERTEBRALS: BILATERAL PATENCY  4.INTRACRANIAL: NEGATIVE FOR INTRACRANIAL OCCLUSION  5.              2040 CT BRAIN WO IV CONTRAST - POSSIBLE STROKE  FINDINGS:  There is no acute intracranial hemorrhage, mass effect, or evidence of large acute infarct.     Brain: Unremarkable     CSF Spaces: Unremarkable      Sinuses/Mastoids:  Clear at visualized levels      Bones: Unremarkable        IMPRESSION:  NO ACUTE FINDINGS         Orders Placed This Encounter    OCCULT BLOOD ([facility] ED USE ONLY)    CT BRAIN WO IV CONTRAST - POSSIBLE STROKE    CANCELED:  CT STROKE PROTOCOL (CTA HEAD/NECK WO/W)    CTA HEAD/NECK W/IV CONTRAST -  STROKE PROTOCOL    CBC/DIFF    COMPREHENSIVE METABOLIC PANEL, NON-FASTING    PT/INR    PTT (PARTIAL THROMBOPLASTIN TIME)    TROPONIN-I    URINALYSIS WITH REFLEX MICROSCOPIC AND CULTURE IF POSITIVE    CBC WITH DIFF    URINALYSIS, MACRO/MICRO    MANUAL DIFFERENTIAL    ECG 12 LEAD    INSERT & MAINTAIN PERIPHERAL IV ACCESS    iohexol (OMNIPAQUE 350) infusion    NS bolus infusion 500 mL    diphenhydrAMINE (BENADRYL) 50 mg/mL injection    metoclopramide (REGLAN) 5 mg/mL injection    buprenorphine (BUPRENEX) 0.3 mg/mL  injection         Any procedures:  Procedures    Impression:   Clinical Impression   Migraine (Primary)   Low hemoglobin   Subconjunctival hemorrhage of left eye       Disposition: Discharged    / Corinna Lines, FNP-BC  05/24/2023, 19:05  [facility]  Department of Emergency Medicine  [facility]    Portions of this note may have been dictated using voice recognition software.     -----------------------  Results for orders placed or performed during the hospital encounter of 05/24/23 (from the past 12 hours)   COMPREHENSIVE METABOLIC PANEL, NON-FASTING   Result Value Ref Range    SODIUM 142 136 - 145 mmol/L    POTASSIUM 3.7 3.5 - 5.1 mmol/L    CHLORIDE 108 (H) 98 - 107 mmol/L    CO2 TOTAL 27 21 - 32 mmol/L    ANION GAP 7 4 - 13 mmol/L    BUN 12 7 - 18 mg/dL    CREATININE 1.61 0.96 - 1.02 mg/dL    BUN/CREA RATIO 15     ESTIMATED GFR 86 >59 mL/min/1.19m^2    ALBUMIN 2.5 (L) 3.4 - 5.0 g/dL    CALCIUM 8.1 (L) 8.5 - 10.1 mg/dL    GLUCOSE 73 (L) 74 - 106 mg/dL    ALKALINE PHOSPHATASE 103 46 - 116 U/L    ALT (SGPT) 37 <=78 U/L    AST (SGOT) 25 15 - 37 U/L    BILIRUBIN TOTAL 0.3 0.2 - 1.0 mg/dL    PROTEIN TOTAL 5.7 (L) 6.4 - 8.2 g/dL    ALBUMIN/GLOBULIN RATIO 0.8 0.8 - 1.4    OSMOLALITY, CALCULATED 282 270 - 290 mOsm/kg    CALCIUM, CORRECTED 9.3 mg/dL    GLOBULIN 3.2    PT/INR   Result Value Ref Range    PROTHROMBIN TIME 12.3 9.8 - 12.7 seconds    INR 1.09 0.84 - 1.10   PTT (PARTIAL THROMBOPLASTIN TIME)   Result Value Ref Range    APTT 53.0 (H) 25.0 - 38.0 seconds   TROPONIN-I   Result Value Ref Range    TROPONIN I <2 <15 ng/L   CBC WITH DIFF   Result Value Ref Range    WBC 2.6 (L) 3.8 - 11.8 x10^3/uL    RBC 3.13 (L) 3.63 - 4.92 x10^6/uL    HGB 8.5 (L) 10.9 - 14.3 g/dL    HCT 04.5 (L) 40.9 - 41.9 %    MCV 84.6 75.5 - 95.3 fL    MCH 27.2 24.7 - 32.8 pg    MCHC 32.1 (L) 32.3 - 35.6 g/dL    RDW 81.1 (H) 91.4 - 17.7 %    PLATELETS 195 140 - 440 x10^3/uL  MPV 8.0 7.9 - 10.8 fL   MANUAL DIFFERENTIAL    Result Value Ref Range    WBC 2.6 x10^3/uL    NEUTROPHIL % 42 40 - 76 %    LYMPHOCYTE % 45 25 - 45 %    MONOCYTE % 11 0 - 12 %    EOSINOPHIL %      BASOPHIL %      METAMYELOCYTE %      MYELOCYTE %      PROMYELOCYTE %      BAND % 2 (L) 5 - 11 %    BLAST %      OTHER %      NEUTROPHIL ABSOLUTE 1.14 (L) 1.80 - 8.40 x10^3/uL    LYMPHOCYTE ABSOLUTE 1.17 1.10 - 5.00 x10^3/uL    MONOCYTE ABSOLUTE 0.29 0.00 - 1.30 x10^3/uL    EOSINOPHIL ABSOLUTE      BASOPHIL ABSOLUTE      METAMYELOCYTE ABSOLUTE      MYELOCYTE ABSOLUTE      PROMYELOCYTE ABSOLUTE      BLAST ABSOLUTE      OTHER CELL ABSOLUTE      ANISOCYTOSIS 1+ (10-25%)     POLYCHROMASIA      POIKILOCYTOSIS      BASOPHILIC STIPPLING      MICROCYTOSIS      MACROCYTOSIS      ROULEAUX      SCHISTOCYTES      SPHEROCYTES      TARGET CELLS      TEARDROP CELLS      OVALOCYTE (ELLIPTOCYTE)      CRENATED RED CELLS      STOMATOCYTES      ACANTHOCYTES (SPUR CELL)      ECHINOCYTE (BURR CELL)      BLISTER CELLS      RBC AGGLUTINATES      HOWELL JOLLY BODIES      ATYPICAL LYMPHOCYTES      TOXIC GRANULATION      DOHLE BODIES      TOXIC VACUOLIZATION      AUER RODS      BASKET CELLS      HYPERSEGMENTATION      LARGE PLATELETS      PLATELET CLUMPS      PLATELET MORPHOLOGY COMMENT Normal     BANDS NEUTROPHILS MANUAL 2     BAND ABSOLUTE      NEUTROPHILS MANUAL 42     LYMPHOCYTES MANUAL 45     MONOCYTES MANUAL 11     EOSINOPHILS MANUAL      BASOPHILS MANUAL      PROMYELOCYTES MANUAL      MYELOCYTES MANUAL      METAMYELOCYTES MANUAL      BLASTS MANUAL      TOTAL CELLS COUNTED [#] IN BLOOD 100     OTHER CELLS MANUAL      NUCLEATED RBC MANUAL      PLASMA CELL %      PLASMA CELL ABSOLUE      PLASMA CELLS MANUAL      HYPOCHROMASIA       CTA HEAD/NECK W/IV CONTRAST -  STROKE PROTOCOL   Final Result   1. RIGHT CAROTID: NEGATIVE FOR STENOSIS   2. LEFT CAROTID: NEGATIVE FOR STENOSIS   3. VERTEBRALS: BILATERAL PATENCY   4. INTRACRANIAL: NEGATIVE FOR INTRACRANIAL OCCLUSION   5.          One or more dose  reduction techniques were used (e.g., Automated exposure control, adjustment  of the mA and/or kV according to patient size, use of iterative reconstruction technique).         Radiologist location ID: [id number]         CT BRAIN WO IV CONTRAST - POSSIBLE STROKE   Final Result   NO ACUTE FINDINGS         One or more dose reduction techniques were used (e.g., Automated exposure control, adjustment of the mA and/or kV according to patient size, use of iterative reconstruction technique).         Radiologist location ID: [id number]

## 2023-05-24 NOTE — ED Nurses Note (Signed)
 Pt states that she does not want to be admitted to the hospital and that at this time she'd prefer to be treated in the ED and discharged home. NP Konrad Dolores notified regarding pt wishes and concerns.

## 2023-05-25 DIAGNOSIS — R29818 Other symptoms and signs involving the nervous system: Secondary | ICD-10-CM

## 2023-05-25 DIAGNOSIS — R9431 Abnormal electrocardiogram [ECG] [EKG]: Secondary | ICD-10-CM

## 2023-05-25 LAB — ECG 12 LEAD
Atrial Rate: 95 {beats}/min
Calculated P Axis: 41 degrees
Calculated R Axis: 5 degrees
Calculated T Axis: 39 degrees
PR Interval: 152 ms
QRS Duration: 72 ms
QT Interval: 330 ms
QTC Calculation: 414 ms
Ventricular rate: 95 {beats}/min

## 2023-06-04 ENCOUNTER — Ambulatory Visit

## 2023-06-04 ENCOUNTER — Other Ambulatory Visit: Payer: Self-pay

## 2023-06-04 DIAGNOSIS — N182 Chronic kidney disease, stage 2 (mild): Secondary | ICD-10-CM | POA: Insufficient documentation

## 2023-06-04 DIAGNOSIS — Z5181 Encounter for therapeutic drug level monitoring: Secondary | ICD-10-CM | POA: Insufficient documentation

## 2023-06-04 DIAGNOSIS — E559 Vitamin D deficiency, unspecified: Secondary | ICD-10-CM | POA: Insufficient documentation

## 2023-06-04 DIAGNOSIS — I10 Essential (primary) hypertension: Secondary | ICD-10-CM | POA: Insufficient documentation

## 2023-06-04 DIAGNOSIS — E1121 Type 2 diabetes mellitus with diabetic nephropathy: Secondary | ICD-10-CM | POA: Insufficient documentation

## 2023-06-05 LAB — COMPREHENSIVE METABOLIC PANEL, NON-FASTING
ALBUMIN/GLOBULIN RATIO: 1.2 (ref 0.8–1.4)
ALBUMIN: 3.5 g/dL (ref 3.5–5.7)
ALKALINE PHOSPHATASE: 75 U/L (ref 34–104)
ALT (SGPT): 26 U/L (ref 7–52)
ANION GAP: 7 mmol/L (ref 4–13)
AST (SGOT): 35 U/L (ref 13–39)
BILIRUBIN TOTAL: 0.5 mg/dL (ref 0.3–1.0)
BUN/CREA RATIO: 10 (ref 6–22)
BUN: 10 mg/dL (ref 7–25)
CALCIUM, CORRECTED: 9.4 mg/dL (ref 8.9–10.8)
CALCIUM: 9 mg/dL (ref 8.6–10.3)
CHLORIDE: 107 mmol/L (ref 98–107)
CO2 TOTAL: 26 mmol/L (ref 21–31)
CREATININE: 0.96 mg/dL (ref 0.60–1.30)
ESTIMATED GFR: 68 mL/min/{1.73_m2} (ref >59)
GLOBULIN: 3 (ref 2.0–3.5)
GLUCOSE: 75 mg/dL (ref 74–109)
OSMOLALITY, CALCULATED: 277 mosm/kg (ref 270–290)
POTASSIUM: 4.3 mmol/L (ref 3.5–5.1)
PROTEIN TOTAL: 6.5 g/dL (ref 6.4–8.9)
SODIUM: 140 mmol/L (ref 136–145)

## 2023-06-05 LAB — URINALYSIS, MACROSCOPIC
BILIRUBIN: NEGATIVE mg/dL
BLOOD: NEGATIVE mg/dL
GLUCOSE: NEGATIVE mg/dL
KETONES: NEGATIVE mg/dL
LEUKOCYTES: NEGATIVE WBCs/uL
NITRITE: NEGATIVE
PH: 6 (ref 5.0–9.0)
PROTEIN: NEGATIVE mg/dL
SPECIFIC GRAVITY: 1.015 (ref 1.002–1.030)
UROBILINOGEN: NORMAL mg/dL

## 2023-06-05 LAB — LIPID PANEL
CHOL/HDL RATIO: 2.3
CHOLESTEROL: 118 mg/dL (ref <200)
HDL CHOL: 51 mg/dL (ref >=40)
LDL CALC: 57 mg/dL (ref 0–100)
TRIGLYCERIDES: 49 mg/dL (ref <=150)
VLDL CALC: 10 mg/dL (ref 0–50)

## 2023-06-05 LAB — DRUG SCREEN, WITH CONFIRMATION, URINE
AMPHETAMINES URINE: NEGATIVE
BARBITURATES URINE: NEGATIVE
BENZODIAZEPINES URINE: NEGATIVE
BUPRENORPHINE URINE: NEGATIVE
CANNABINOIDS URINE: NEGATIVE
COCAINE METABOLITES URINE: NEGATIVE
FENTANYL, URINE: NEGATIVE
METHADONE URINE: NEGATIVE
OPIATES URINE: NEGATIVE
OXYCODONE URINE: NEGATIVE
PCP URINE: NEGATIVE

## 2023-06-05 LAB — CBC WITH DIFF
BASOPHIL #: 0 10*3/uL (ref 0.00–0.10)
BASOPHIL %: 1 % (ref 0–1)
EOSINOPHIL #: 0 10*3/uL (ref 0.00–0.50)
EOSINOPHIL %: 2 % (ref 1–7)
HCT: 34.9 % (ref 31.2–41.9)
HGB: 11.4 g/dL (ref 10.9–14.3)
LYMPHOCYTE #: 0.8 10*3/uL — ABNORMAL LOW (ref 1.10–3.10)
LYMPHOCYTE %: 29 % (ref 16–46)
MCH: 27.1 pg (ref 24.7–32.8)
MCHC: 32.6 g/dL (ref 32.3–35.6)
MCV: 83 fL (ref 75.5–95.3)
MONOCYTE #: 0.3 10*3/uL (ref 0.20–0.90)
MONOCYTE %: 12 % — ABNORMAL HIGH (ref 4–11)
MPV: 7.9 fL (ref 7.9–10.8)
NEUTROPHIL #: 1.5 10*3/uL — ABNORMAL LOW (ref 1.90–8.20)
NEUTROPHIL %: 57 % (ref 43–77)
PLATELETS: 248 10*3/uL (ref 140–440)
RBC: 4.2 10*6/uL (ref 3.63–4.92)
RDW: 16 % (ref 12.3–17.7)
WBC: 2.7 10*3/uL — ABNORMAL LOW (ref 3.8–11.8)

## 2023-06-05 LAB — HGA1C (HEMOGLOBIN A1C WITH EST AVG GLUCOSE): HEMOGLOBIN A1C: 5.7 % (ref 4.0–6.0)

## 2023-06-05 LAB — URINALYSIS, MICROSCOPIC
RBCS: <1 /HPF (ref <4)
SQUAMOUS EPITHELIAL: 6 /HPF (ref <28)
WBCS: 1 /HPF (ref <6)

## 2023-06-05 LAB — MICROALBUMIN/CREATININE RATIO, URINE, RANDOM
CREATININE RANDOM URINE: 114 mg/dL (ref 30–125)
MICROALBUMIN RANDOM URINE: <0.7 mg/dL

## 2023-06-05 LAB — THYROID STIMULATING HORMONE WITH FREE T4 REFLEX: TSH: 1.053 u[IU]/mL (ref 0.450–5.330)

## 2023-06-05 LAB — VITAMIN D 25 TOTAL: VITAMIN D 25, TOTAL: 11.65 ng/mL — ABNORMAL LOW (ref 30.00–100.00)

## 2023-06-08 ENCOUNTER — Other Ambulatory Visit: Payer: Self-pay

## 2023-06-08 ENCOUNTER — Emergency Department (HOSPITAL_COMMUNITY)

## 2023-06-08 ENCOUNTER — Encounter (HOSPITAL_BASED_OUTPATIENT_CLINIC_OR_DEPARTMENT_OTHER): Payer: Self-pay

## 2023-06-08 ENCOUNTER — Telehealth (HOSPITAL_COMMUNITY): Payer: Self-pay | Admitting: Internal Medicine

## 2023-06-08 ENCOUNTER — Emergency Department
Admission: EM | Admit: 2023-06-08 | Discharge: 2023-06-08 | Disposition: A | Discharge status: Home / Self Care | Source: Home / Self Care | Attending: Emergency Medicine | Admitting: Emergency Medicine

## 2023-06-08 DIAGNOSIS — I10 Essential (primary) hypertension: Secondary | ICD-10-CM | POA: Insufficient documentation

## 2023-06-08 DIAGNOSIS — R42 Dizziness and giddiness: Secondary | ICD-10-CM | POA: Insufficient documentation

## 2023-06-08 DIAGNOSIS — H538 Other visual disturbances: Secondary | ICD-10-CM | POA: Insufficient documentation

## 2023-06-08 DIAGNOSIS — H1131 Conjunctival hemorrhage, right eye: Secondary | ICD-10-CM | POA: Insufficient documentation

## 2023-06-08 DIAGNOSIS — R519 Headache, unspecified: Secondary | ICD-10-CM

## 2023-06-08 DIAGNOSIS — Z8673 Personal history of transient ischemic attack (TIA), and cerebral infarction without residual deficits: Secondary | ICD-10-CM | POA: Insufficient documentation

## 2023-06-08 LAB — CBC WITH DIFF
BASOPHIL #: 0.02 10*3/uL (ref 0.00–0.10)
BASOPHIL %: 1 % (ref 0–1)
EOSINOPHIL #: 0.09 10*3/uL (ref 0.00–0.50)
EOSINOPHIL %: 3 % (ref 1–7)
HCT: 31 % — ABNORMAL LOW (ref 31.2–41.9)
HGB: 10.2 g/dL — ABNORMAL LOW (ref 10.9–14.3)
LYMPHOCYTE #: 0.94 10*3/uL — ABNORMAL LOW (ref 1.10–3.10)
LYMPHOCYTE %: 34 % (ref 16–46)
MCH: 27.7 pg (ref 24.7–32.8)
MCHC: 32.7 g/dL (ref 32.3–35.6)
MCV: 84.8 fL (ref 75.5–95.3)
MONOCYTE #: 0.32 10*3/uL (ref 0.20–0.90)
MONOCYTE %: 12 % — ABNORMAL HIGH (ref 4–11)
MPV: 7.8 fL — ABNORMAL LOW (ref 7.9–10.8)
NEUTROPHIL #: 1.39 10*3/uL — ABNORMAL LOW (ref 1.90–8.20)
NEUTROPHIL %: 50 % (ref 43–77)
PLATELETS: 223 10*3/uL (ref 140–440)
RBC: 3.66 10*6/uL (ref 3.63–4.92)
RDW: 19 % — ABNORMAL HIGH (ref 12.3–17.7)
WBC: 2.8 10*3/uL — ABNORMAL LOW (ref 3.8–11.8)

## 2023-06-08 LAB — COMPREHENSIVE METABOLIC PANEL, NON-FASTING
ALBUMIN/GLOBULIN RATIO: 0.7 — ABNORMAL LOW (ref 0.8–1.4)
ALBUMIN: 2.5 g/dL — ABNORMAL LOW (ref 3.4–5.0)
ALKALINE PHOSPHATASE: 94 U/L (ref 46–116)
ALT (SGPT): 55 U/L (ref <=78)
ANION GAP: 7 mmol/L (ref 4–13)
AST (SGOT): 90 U/L — ABNORMAL HIGH (ref 15–37)
BILIRUBIN TOTAL: 0.4 mg/dL (ref 0.2–1.0)
BUN/CREA RATIO: 9
BUN: 9 mg/dL (ref 7–18)
CALCIUM, CORRECTED: 9.9 mg/dL
CALCIUM: 8.7 mg/dL (ref 8.5–10.1)
CHLORIDE: 106 mmol/L (ref 98–107)
CO2 TOTAL: 30 mmol/L (ref 21–32)
CREATININE: 1.02 mg/dL (ref 0.55–1.02)
ESTIMATED GFR: 63 mL/min/{1.73_m2} (ref >59)
GLOBULIN: 3.5
GLUCOSE: 71 mg/dL — ABNORMAL LOW (ref 74–106)
OSMOLALITY, CALCULATED: 282 mosm/kg (ref 270–290)
POTASSIUM: 3.7 mmol/L (ref 3.5–5.1)
PROTEIN TOTAL: 6 g/dL — ABNORMAL LOW (ref 6.4–8.2)
SODIUM: 143 mmol/L (ref 136–145)

## 2023-06-08 LAB — PT/INR
INR: 1.04 (ref 0.84–1.10)
PROTHROMBIN TIME: 11.7 s (ref 9.8–12.7)

## 2023-06-08 LAB — PTT (PARTIAL THROMBOPLASTIN TIME): APTT: 46.9 s — ABNORMAL HIGH (ref 25.0–38.0)

## 2023-06-08 MED ORDER — LORAZEPAM 2 MG/ML INJECTION SOLUTION
INTRAMUSCULAR | Status: AC
Start: 2023-06-08 — End: 2023-06-08
  Filled 2023-06-08: qty 1

## 2023-06-08 MED ORDER — BUPRENORPHINE HCL 0.3 MG/ML INJECTION SOLUTION
INTRAMUSCULAR | Status: AC
Start: 2023-06-08 — End: 2023-06-08
  Filled 2023-06-08: qty 1

## 2023-06-08 MED ORDER — HEPARIN, PORCINE (PF) 100 UNIT/ML INTRAVENOUS SYRINGE
INJECTION | INTRAVENOUS | Status: AC
Start: 2023-06-08 — End: 2023-06-08
  Filled 2023-06-08: qty 5

## 2023-06-08 MED ORDER — TETRACAINE 0.5 % EYE DROPS
1.0000 [drp] | OPHTHALMIC | Status: AC
Start: 2023-06-08 — End: 2023-06-08
  Administered 2023-06-08: 1 [drp] via OPHTHALMIC

## 2023-06-08 MED ORDER — MORPHINE 4 MG/ML INJECTION WRAPPER
4.0000 mg | INJECTION | INTRAMUSCULAR | Status: DC
Start: 2023-06-08 — End: 2023-06-08

## 2023-06-08 MED ORDER — TETRACAINE 0.5 % EYE DROPS
OPHTHALMIC | Status: AC
Start: 2023-06-08 — End: 2023-06-08
  Filled 2023-06-08: qty 100

## 2023-06-08 MED ORDER — PROCHLORPERAZINE EDISYLATE 10 MG/2 ML (5 MG/ML) INJECTION SOLUTION
2.5000 mg | INTRAMUSCULAR | Status: AC
Start: 2023-06-08 — End: 2023-06-08
  Administered 2023-06-08: 2.5 mg via INTRAVENOUS

## 2023-06-08 MED ORDER — DROPERIDOL 2.5 MG/ML INJECTION SOLUTION
0.6250 mg | Freq: Once | INTRAMUSCULAR | Status: AC
Start: 2023-06-08 — End: 2023-06-08
  Administered 2023-06-08: 0.625 mg via INTRAVENOUS
  Filled 2023-06-08: qty 0.25

## 2023-06-08 MED ORDER — BUPRENORPHINE HCL 0.3 MG/ML INJECTION SOLUTION
0.1500 mg | INTRAMUSCULAR | Status: AC
Start: 2023-06-08 — End: 2023-06-08
  Administered 2023-06-08: 0.15 mg via INTRAVENOUS

## 2023-06-08 MED ORDER — DIPHENHYDRAMINE 50 MG/ML INJECTION SOLUTION
INTRAMUSCULAR | Status: AC
Start: 2023-06-08 — End: 2023-06-08
  Filled 2023-06-08: qty 1

## 2023-06-08 MED ORDER — DIPHENHYDRAMINE 50 MG/ML INJECTION SOLUTION
12.5000 mg | INTRAMUSCULAR | Status: AC
Start: 2023-06-08 — End: 2023-06-08
  Administered 2023-06-08: 12.5 mg via INTRAVENOUS

## 2023-06-08 MED ORDER — LORAZEPAM 2 MG/ML INJECTION WRAPPER
1.0000 mg | INTRAMUSCULAR | Status: AC
Start: 2023-06-08 — End: 2023-06-08
  Administered 2023-06-08: 1 mg via INTRAVENOUS

## 2023-06-08 MED ORDER — GADOBUTROL 10 MMOL/10 ML (1 MMOL/ML) INTRAVENOUS SOLUTION
10.0000 mL | INTRAVENOUS | Status: AC
Start: 2023-06-08 — End: 2023-06-08
  Administered 2023-06-08: 7 mL via INTRAVENOUS

## 2023-06-08 MED ORDER — PROCHLORPERAZINE EDISYLATE 10 MG/2 ML (5 MG/ML) INJECTION SOLUTION
INTRAMUSCULAR | Status: AC
Start: 2023-06-08 — End: 2023-06-08
  Filled 2023-06-08: qty 2

## 2023-06-08 MED ORDER — PROCHLORPERAZINE MALEATE 10 MG TABLET
5.0000 mg | ORAL_TABLET | Freq: Three times a day (TID) | ORAL | 0 refills | Status: AC | PRN
Start: 2023-06-08 — End: 2023-06-13

## 2023-06-08 MED ORDER — IOHEXOL 350 MG IODINE/ML INTRAVENOUS SOLUTION
100.0000 mL | INTRAVENOUS | Status: AC
Start: 2023-06-08 — End: 2023-06-08
  Administered 2023-06-08: 70 mL via INTRAVENOUS

## 2023-06-08 NOTE — Discharge Instructions (Signed)
 Follow up with Dr. Rayma Calandra this week, and with Dr. Reinhold Carbine.     See your eye doctor this week.    Return for any problems.

## 2023-06-08 NOTE — ED Nurses Note (Signed)
 [facility] in room for transport at this time

## 2023-06-08 NOTE — ED Notes (Signed)
 [facility] contacted to transport patient to main campus ED at this time

## 2023-06-08 NOTE — ED Attending Handoff Note (Signed)
 Emergency Medicine  Name: Martha White  Age and Gender: 58 y.o. female  Date of Birth: [birth year]  MRN: [id number]    ED Course Since Sign Out:    IOP with [employer] using Tetracaine  for anesthesia:    OS 29mm/Hg OD 4mm/ Hg]    She tells me that she has a prescription for new glasses but has not filled them. She sees Dr. Harvest Lineman, OD, in [place], [place].    She also sees Dr. Rayma Calandra, cardiology.     She has been having headaches for one year or so and states that her son has cluster-migraines.    Neurology consult earlier by Dr. Zamzam recommends:    "Agree with STAT CTH and CTA H/N. Will ultimately need MRI Brain wwo con  - ophthalmology c.s for conjuntival bleed  - clearance for anti thrombotics given conjuntival bleed as per primary team    - tte  - hba1c, lipid panel  - high intensity statin."    However, patient has no findings of ischemia.  I believe she needs follow up with neurology, Dr. Reinhold Carbine, and with Dr. Rayma Calandra, her cardiologist, to determine when she should restart her Plavix.    She is to follow up with her optometrist this week about her hemorrhage.    I believe she can be safely discharged.    Filed Vitals:    06/08/23 1445 06/08/23 1500 06/08/23 1515 06/08/23 1549   BP:    (!) 144/79   Pulse: 88 98  88   Resp: 19 16  17    Temp:    36.6 C (97.9 F)   SpO2: 100% 97% 100% 100%           Impression:   Clinical Impression   Subconjunctival hemorrhage of right eye (Primary)   Headache   Hypertension, unspecified type         Disposition: Discharged      Discharge Instructions Given to Patient:        Portions of this note may have been dictated using voice recognition software.     Joleen Navy, MD  [facility]

## 2023-06-08 NOTE — ED Nurses Note (Signed)
 Pt arrived , pt to er 13.

## 2023-06-08 NOTE — ED Provider Notes (Signed)
 [facility] - Emergency Department  ED Primary Provider Note  History of Present Illness   Chief Complaint   Patient presents with    Headache     Patient states started 20 min ago.  States she has a HA and she felt a pop in the top right side of head  States eye sight is blurry and see three of everything on top of each other. Also right eye is red.  States she was here last week for the same thing but on the left side and was told to come back if it happened again     Arrival: The patient arrived by Car  Bisma Klett is a 58 y.o. female who had concerns including Headache. Pt states she felt a pop in her head "just like how you pop a pimple"  then my rt eye is bleeding and hurting. States same thing happened march 7th on left side. Had ct angio head neck neg.  Hx of HTN.   Review of Systems   Constitutional: No fever, chills or weakness   Skin: No rash or diaphoresis  HENT: + headaches,  no  congestion  Eyes: + eye pain vision changes, subconjunctival hemorrhage , photophobia   Cardio: No chest pain, palpitations or leg swelling   Respiratory: No cough, wheezing or SOB  GI:  No nausea, vomiting or stool changes  GU:  No dysuria, hematuria, or increased frequency  MSK: No muscle aches, joint or back pain  Neuro: No seizures, LOC, numbness, tingling, or focal weakness  Psychiatric: No depression, SI or substance abuse  All other systems reviewed and are negative.    History Reviewed This Encounter: all noted and reviewed.     Physical Exam   ED Triage Vitals [06/08/23 1130]   BP (Non-Invasive) (!) 142/93   Heart Rate 100   Respiratory Rate 18   Temperature 36.8 C (98.2 F)   SpO2 99 %   Weight 71.2 kg (157 lb)   Height 1.499 m (4\' 11" )       Constitutional:  58 y.o. female who appears in no distress. Normal color, no cyanosis.   HENT:   Head: Normocephalic and atraumatic.   Mouth/Throat: Oropharynx is clear and moist.   Eyes: EOMI, PERRL  rt subconjunctival hemorrhage.   Neck:  Trachea midline. Neck supple.  Cardiovascular: RRR, No murmurs, rubs or gallops. Intact distal pulses.  Pulmonary/Chest: BS equal bilaterally. No respiratory distress. No wheezes, rales or chest tenderness.   Abdominal: Bowel sounds present and normal. Abdomen soft, no tenderness, no rebound and no guarding.  Back: No midline spinal tenderness, no paraspinal tenderness, no CVA tenderness.           Musculoskeletal: No edema, tenderness or deformity.  Skin: warm and dry. No rash, erythema, pallor or cyanosis  Psychiatric: normal mood and affect. Behavior is normal.   Neurological: Patient keenly alert and responsive, easily able to raise eyebrows, facial muscles/expressions symmetric, speaking in fluent sentences, moving all extremities equally and fully, normal gait    Labs Ordered/Reviewed   COMPREHENSIVE METABOLIC PANEL, NON-FASTING - Abnormal; Notable for the following components:       Result Value    ALBUMIN 2.5 (*)     GLUCOSE 71 (*)     AST (SGOT) 90 (*)     PROTEIN TOTAL 6.0 (*)     ALBUMIN/GLOBULIN RATIO 0.7 (*)     All other components within normal limits    Narrative:  Estimated Glomerular Filtration Rate (eGFR) is calculated using the CKD-EPI (2021) equation, intended for patients 75 years of age and older. If gender is not documented or "unknown", there will be no eGFR calculation.   PTT (PARTIAL THROMBOPLASTIN TIME) - Abnormal; Notable for the following components:    APTT 46.9 (*)     All other components within normal limits   CBC WITH DIFF - Abnormal; Notable for the following components:    WBC 2.8 (*)     HGB 10.2 (*)     HCT 31.0 (*)     RDW 19.0 (*)     MPV 7.8 (*)     MONOCYTE % 12 (*)     NEUTROPHIL # 1.39 (*)     LYMPHOCYTE # 0.94 (*)     All other components within normal limits   PT/INR - Normal    Narrative:     In the setting of warfarin therapy, a moderate-intensity INR goal range is 2.0 to 3.0 and a high-intensity INR goal range is 2.5 to 3.5.    INR is ONLY validated to determine  the level of anticoagulation with vitamin K antagonists (warfarin). Other factors may elevate the INR including but not limited to direct oral anticoagulants (DOACs), liver dysfunction, vitamin K deficiency, DIC, factor deficiencies, and factor inhibitors.   CBC/DIFF    Narrative:     The following orders were created for panel order CBC/DIFF.  Procedure                               Abnormality         Status                     ---------                               -----------         ------                     CBC WITH DIFF[[id number]]                Abnormal            Final result                 Please view results for these tests on the individual orders.     No orders to display     Medical Decision Making   Diff dx of hemorrhage, subconjunctival  hypertensive ha. Blood vessel rupture from uncontrolled HTN. HCG 10.2, no ct avail here given pt had recent ct will send to [facility] er for mri.             Medications Ordered/Administered in the ED   tetracaine  (PONTOCAINE) 0.5 % ophthalmic solution (1 Drop Right Eye Given 06/08/23 1218)   buprenorphine  (BUPRENEX ) 0.3 mg/mL injection (0.15 mg Intravenous Given 06/08/23 1222)   prochlorperazine  (COMPAZINE ) 5 mg/mL injection (2.5 mg Intravenous Given 06/08/23 1222)     Clinical Impression   Subconjunctival hemorrhage of right eye (Primary)   Headache   Hypertension, unspecified type       Disposition: Transfered to Another Facility

## 2023-06-08 NOTE — Progress Notes (Signed)
 Summary of consults: 1223 discussed with dr. Bezzek given she had recent ct march 7th and cta he agreed pt needs mri. Send to [facility]. 1235 discussed with dr. Bartholomew Boros, recommend teleneuro consult. 1324 teleneuro consult made. Dr. Zamzam to call back per mars line. 1338 discussed case in depth with dr. Zamzam. He will make a note for recommendations. Pt needs repeat ct head wo and angio head neck with iv, plus mri . Transfer pt to p[facility]. 1340 dr leap notified of recommendations. Pain down to a 7 from 10 . Pt also relates she stopped Plavix  3 to 4 weeks ago for dental work. So currently off of it. Risk factors  longstanding hypertension, dm and prior TIA.

## 2023-06-08 NOTE — ED Nurses Note (Signed)
 Patient states she has not taken her Plavix for about 3 weeks. States she stopped it for a dental procedure and then stopped it for a subconjunctival hemorrhage of her left eye and has not restarted it since.

## 2023-06-08 NOTE — ED Nurses Note (Signed)
 Report called to [facility] in [facility] ED at this time

## 2023-06-08 NOTE — Consults (Signed)
 Martha White, 58 y.o. female  MRN:  [id number]     Date of Admission:  (Not on file)  Date of Birth:  01-27-1966  Date of Service:  06/08/2023  Time of Consult: 1:00 PM    Patient/Family consent for econsult- Yes  Location of Consultation:  [facility], [address]. [place], [place] [address]  Consult Requested By Dr. Linder Revere  Time Spent: 10 mins    Reason for consult: headache    Recommendations:   Agree with STAT CTH and CTA H/N. Will ultimately need MRI Brain wwo con  - ophthalmology c.s for conjuntival bleed  - clearance for anti thrombotics given conjuntival bleed as per primary team    - tte  - hba1c, lipid panel  - high intensity statin.    Unclear etiology of headache, suspecting conjunctival bleeding?intra-ocular pain.    Martha Probert, MD  06/08/2023, 13:37

## 2023-06-08 NOTE — ED Nurses Note (Signed)
 Pt d/c to home. Education given. Port de-accessed. D/C paperwork handed to pt. Ambulatory, gait steady. AAOx3.

## 2023-06-20 ENCOUNTER — Ambulatory Visit
Admission: RE | Admit: 2023-06-20 | Discharge: 2023-06-20 | Disposition: A | Discharge status: Home / Self Care | Source: Ambulatory Visit | Attending: NURSE PRACTITIONER | Admitting: NURSE PRACTITIONER

## 2023-06-20 ENCOUNTER — Other Ambulatory Visit (HOSPITAL_COMMUNITY): Payer: Self-pay

## 2023-06-20 ENCOUNTER — Ambulatory Visit (HOSPITAL_COMMUNITY)

## 2023-06-20 ENCOUNTER — Other Ambulatory Visit: Payer: Self-pay

## 2023-06-20 VITALS — BP 140/87 | HR 110 | Temp 97.8°F | Resp 20

## 2023-06-20 DIAGNOSIS — D72819 Decreased white blood cell count, unspecified: Secondary | ICD-10-CM | POA: Insufficient documentation

## 2023-06-20 DIAGNOSIS — Z789 Other specified health status: Secondary | ICD-10-CM | POA: Insufficient documentation

## 2023-06-20 LAB — CBC WITH DIFF
BASOPHIL #: 0 10*3/uL (ref 0.00–0.10)
BASOPHIL %: 1 % (ref 0–1)
EOSINOPHIL #: 0.1 10*3/uL (ref 0.00–0.50)
EOSINOPHIL %: 2 % (ref 1–7)
HCT: 30.8 % — ABNORMAL LOW (ref 31.2–41.9)
HGB: 10.2 g/dL — ABNORMAL LOW (ref 10.9–14.3)
LYMPHOCYTE #: 0.9 10*3/uL — ABNORMAL LOW (ref 1.10–3.10)
LYMPHOCYTE %: 23 % (ref 16–46)
MCH: 27.4 pg (ref 24.7–32.8)
MCHC: 33.1 g/dL (ref 32.3–35.6)
MCV: 82.8 fL (ref 75.5–95.3)
MONOCYTE #: 0.5 10*3/uL (ref 0.20–0.90)
MONOCYTE %: 13 % — ABNORMAL HIGH (ref 4–11)
MPV: 8.2 fL (ref 7.9–10.8)
NEUTROPHIL #: 2.5 10*3/uL (ref 1.90–8.20)
NEUTROPHIL %: 62 % (ref 43–77)
PLATELETS: 225 10*3/uL (ref 140–440)
RBC: 3.72 10*6/uL (ref 3.63–4.92)
RDW: 15.9 % (ref 12.3–17.7)
WBC: 4 10*3/uL (ref 3.8–11.8)

## 2023-06-20 MED ORDER — SODIUM CHLORIDE 0.9 % (FLUSH) INJECTION SYRINGE
20.0000 mL | INJECTION | INTRAMUSCULAR | Status: DC | PRN
Start: 2023-06-20 — End: 2023-06-21

## 2023-06-20 NOTE — Nurses Notes (Signed)
 [id number] Patient came in ambulatory for port flush. Orders obtained from her dr. Lalla Pill accessed. Labs drawn. Port flushed and deaccessed. Site clear. Left unit ambulatory with no c/o offered. Audra Learn, RN

## 2023-06-20 NOTE — Nurses Notes (Signed)
 1320-New telephone orders received from Susette Erb, FNP at [facility] to access and obtain labs from right upper chest PortACath.  New orders carried out. Audree Bless, RN

## 2023-08-02 ENCOUNTER — Ambulatory Visit

## 2023-08-02 ENCOUNTER — Other Ambulatory Visit: Payer: Self-pay

## 2023-08-02 DIAGNOSIS — K529 Noninfective gastroenteritis and colitis, unspecified: Secondary | ICD-10-CM | POA: Insufficient documentation

## 2023-08-02 LAB — MAGNESIUM: MAGNESIUM: 1.8 mg/dL — ABNORMAL LOW (ref 1.9–2.7)

## 2023-08-02 LAB — PARATHYROID HORMONE (PTH): PTH: 78.9 pg/mL (ref 12.0–88.0)

## 2023-08-05 LAB — TISSUE TRANSGLUTAMINASE (TTG) IGA & IGG PROFILE
TISSUE TRANSGLUTAMINASE ANTIBODIES IGA QUALITATIVE: NEGATIVE
TISSUE TRANSGLUTAMINASE ANTIBODIES IGA QUANTITATIVE: <0.5 U/mL (ref <15.0)
TISSUE TRANSGLUTAMINASE ANTIBODIES IGG QUALITATIVE: NEGATIVE
TISSUE TRANSGLUTAMINASE ANTIBODIES IGG QUANTITATIVE: 3.2 U/mL (ref <15.0)

## 2023-08-06 LAB — COPPER, SERUM: COPPER: 97 ug/dL (ref 70–175)

## 2023-08-07 LAB — VITAMIN A, SERUM: VITAMIN A (RETINOL): 18 ug/dL — ABNORMAL LOW (ref 38–98)

## 2023-08-09 LAB — NIACIN (VITAMIN B3), PLASMA
NICOTINAMIDE: <20 ng/mL
NICOTINIC ACID: <20 ng/mL

## 2023-09-21 ENCOUNTER — Emergency Department
Admission: EM | Admit: 2023-09-21 | Discharge: 2023-09-21 | Disposition: A | Discharge status: Home / Self Care | Source: Home / Self Care | Attending: Family | Admitting: Family

## 2023-09-21 ENCOUNTER — Other Ambulatory Visit: Payer: Self-pay

## 2023-09-21 ENCOUNTER — Encounter (HOSPITAL_BASED_OUTPATIENT_CLINIC_OR_DEPARTMENT_OTHER): Payer: Self-pay

## 2023-09-21 DIAGNOSIS — T829XXA Unspecified complication of cardiac and vascular prosthetic device, implant and graft, initial encounter: Secondary | ICD-10-CM | POA: Insufficient documentation

## 2023-09-21 DIAGNOSIS — Z95828 Presence of other vascular implants and grafts: Secondary | ICD-10-CM

## 2023-09-21 DIAGNOSIS — Y828 Other medical devices associated with adverse incidents: Secondary | ICD-10-CM | POA: Insufficient documentation

## 2023-09-21 DIAGNOSIS — Z452 Encounter for adjustment and management of vascular access device: Secondary | ICD-10-CM

## 2023-09-21 MED ORDER — HEPARIN, PORCINE (PF) 100 UNIT/ML INTRAVENOUS SYRINGE
5.0000 mL | INJECTION | Freq: Once | INTRAVENOUS | Status: AC
Start: 2023-09-21 — End: 2023-09-21
  Administered 2023-09-21: 5 mL

## 2023-09-21 MED ORDER — HEPARIN, PORCINE (PF) 100 UNIT/ML INTRAVENOUS SYRINGE
INJECTION | INTRAVENOUS | Status: AC
Start: 2023-09-21 — End: 2023-09-21
  Filled 2023-09-21: qty 5

## 2023-09-21 NOTE — ED Provider Notes (Signed)
 [facility], [place] - Emergency Department  ED Primary Note  History of Present Illness   Martha White is a 58 y.o. female who had concerns including Vascular Access Problem. Port a cath flush needed. Rt chest. No co.   Review of Systems   Constitutional: No fever, chills or weakness   Skin: No rash or diaphoresis  HENT: No headaches, or congestion  Eyes: No vision changes or photophobia   Cardio: No chest pain, palpitations or leg swelling   Respiratory: No cough, wheezing or SOB  GI:  No nausea, vomiting or stool changes  GU:  No dysuria, hematuria, or increased frequency  MSK: No muscle aches, joint or back pain  Neuro: No seizures, LOC, numbness, tingling, or focal weakness  Psychiatric: No depression, SI or substance abuse  All other systems reviewed and are negative.       All other systems reviewed and are negative.     Physical Exam   ED Triage Vitals [09/21/23 1259]   BP (Non-Invasive) 115/75   Heart Rate 95   Respiratory Rate 18   Temperature 36.6 C (97.9 F)   SpO2 100 %   Weight 73.8 kg (162 lb 9.6 oz)   Height 1.511 m (4' 11.5)     Constitutional:  58 y.o. female who appears in no distress. Normal color, no cyanosis.   HENT:   Head: Normocephalic and atraumatic.   Mouth/Throat: Oropharynx is CLEAR and moist.   Eyes: EOMI, PERRL   Neck: Trachea midline. Neck supple.  Cardiovascular: RRR, No murmurs, rubs or gallops. Intact distal pulses.  Pulmonary/Chest: BS equal bilaterally. No respiratory distress. No wheezes, rales or chest tenderness.   Abdominal: Bowel sounds present and normal. Abdomen soft, no tenderness, no rebound and no guarding.  Back: No midline spinal tenderness, no paraspinal tenderness, no CVA tenderness.           Musculoskeletal: No edema, tenderness or deformity.  Skin: warm and dry. No rash, erythema, pallor or cyanosis  Psychiatric: normal mood and affect. Behavior is normal.   Neurological: Patient keenly alert and responsive, easily able to raise eyebrows,  facial muscles/expressions symmetric, speaking in fluent sentences, moving all extremities equally and fully, normal gait  Patient Data   Labs Ordered/Reviewed - No data to display  No orders to display     Medical Decision Making   Diff dx. Port a cath cath. Vascular access care  Port flushed by rn.   Clinical Impression   Port-A-Cath in place (Primary)       Disposition: Discharged

## 2023-09-21 NOTE — ED Nurses Note (Signed)
 Patient just here to have PAC flushed.

## 2023-09-21 NOTE — ED Nurses Note (Signed)

## 2023-10-04 ENCOUNTER — Encounter (HOSPITAL_COMMUNITY): Payer: Self-pay

## 2023-11-07 ENCOUNTER — Other Ambulatory Visit: Payer: Self-pay

## 2023-11-07 ENCOUNTER — Ambulatory Visit
Admission: RE | Admit: 2023-11-07 | Discharge: 2023-11-07 | Disposition: A | Discharge status: Home / Self Care | Payer: MEDICAID | Source: Ambulatory Visit

## 2023-11-07 VITALS — BP 157/84 | HR 108 | Temp 97.6°F | Resp 20

## 2023-11-07 DIAGNOSIS — D72819 Decreased white blood cell count, unspecified: Secondary | ICD-10-CM

## 2023-11-07 DIAGNOSIS — Z789 Other specified health status: Secondary | ICD-10-CM

## 2023-11-07 DIAGNOSIS — D72810 Lymphocytopenia: Secondary | ICD-10-CM | POA: Insufficient documentation

## 2023-11-07 LAB — BASIC METABOLIC PANEL
ANION GAP: 5 mmol/L (ref 4–13)
BUN/CREA RATIO: 9 (ref 6–22)
BUN: 9 mg/dL (ref 7–25)
CALCIUM: 8.8 mg/dL (ref 8.6–10.3)
CHLORIDE: 108 mmol/L — ABNORMAL HIGH (ref 98–107)
CO2 TOTAL: 27 mmol/L (ref 21–31)
CREATININE: 0.97 mg/dL (ref 0.60–1.30)
ESTIMATED GFR: 67 mL/min/1.73mˆ2 (ref >59)
GLUCOSE: 78 mg/dL (ref 74–109)
OSMOLALITY, CALCULATED: 277 mosm/kg (ref 270–290)
POTASSIUM: 4.1 mmol/L (ref 3.5–5.1)
SODIUM: 140 mmol/L (ref 136–145)

## 2023-11-07 LAB — FOLATE: FOLATE: 27.9 ng/mL — ABNORMAL HIGH (ref 5.9–24.8)

## 2023-11-07 LAB — IRON TRANSFERRIN AND TIBC
IRON (TRANSFERRIN) SATURATION: 5 % — ABNORMAL LOW (ref 15–50)
IRON: 18 ug/dL — ABNORMAL LOW (ref 50–212)
TOTAL IRON BINDING CAPACITY: 398 ug/dL (ref 250–450)
TRANSFERRIN: 284 mg/dL (ref 203–362)
UIBC: 380 ug/dL — ABNORMAL HIGH (ref 130–375)

## 2023-11-07 LAB — CBC WITH DIFF
BASOPHIL #: 0.1 x10ˆ3/uL (ref 0.00–0.10)
BASOPHIL %: 2 % — ABNORMAL HIGH (ref 0–1)
EOSINOPHIL #: 0.2 x10ˆ3/uL (ref 0.00–0.50)
EOSINOPHIL %: 7 % (ref 1–7)
HCT: 24.2 % — ABNORMAL LOW (ref 31.2–41.9)
HGB: 7.9 g/dL — ABNORMAL LOW (ref 10.9–14.3)
LYMPHOCYTE #: 0.9 x10ˆ3/uL — ABNORMAL LOW (ref 1.10–3.10)
LYMPHOCYTE %: 28 % (ref 16–46)
MCH: 25.1 pg (ref 24.7–32.8)
MCHC: 32.6 g/dL (ref 32.3–35.6)
MCV: 76.9 fL (ref 75.5–95.3)
MONOCYTE #: 0.3 x10ˆ3/uL (ref 0.20–0.90)
MONOCYTE %: 10 % (ref 4–11)
MPV: 6.2 fL — ABNORMAL LOW (ref 7.9–10.8)
NEUTROPHIL #: 1.8 x10ˆ3/uL — ABNORMAL LOW (ref 1.90–8.20)
NEUTROPHIL %: 53 % (ref 43–77)
PLATELETS: 438 x10ˆ3/uL (ref 140–440)
RBC: 3.15 x10ˆ6/uL — ABNORMAL LOW (ref 3.63–4.92)
RDW: 15.9 % (ref 12.3–17.7)
WBC: 3.3 x10ˆ3/uL — ABNORMAL LOW (ref 3.8–11.8)

## 2023-11-07 LAB — VITAMIN B12: VITAMIN B 12: >1500 pg/mL — ABNORMAL HIGH (ref 180–914)

## 2023-11-07 LAB — HGA1C (HEMOGLOBIN A1C WITH EST AVG GLUCOSE): HEMOGLOBIN A1C: 5.6 % (ref 4.0–6.0)

## 2023-11-07 MED ORDER — SODIUM CHLORIDE 0.9 % (FLUSH) INJECTION SYRINGE
20.0000 mL | INJECTION | INTRAMUSCULAR | Status: DC | PRN
Start: 2023-11-07 — End: 2023-11-08

## 2023-11-07 NOTE — Nurses Notes (Signed)
 1218 patient arrived ambulatory to floor accompanied by family member. Patient here for port flush, and labs. Leandrew Kitty, RN  1220 VSS. 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 Patient reports remarkable pain located in patients shoulder. Patient presents in cast. Leandrew Kitty, RN  1235 right chest port accessed. Blood return noted. Labs collected. Deaccessed. Pressure dressing applied. Leandrew Kitty, RN  [phone] patient left floor ambulatory at this time Leandrew Kitty, RN

## 2023-11-08 ENCOUNTER — Encounter (HOSPITAL_COMMUNITY): Payer: Self-pay

## 2023-11-12 LAB — TISSUE TRANSGLUTAMINASE (TTG) ANTIBODY, IGA, SERUM (MAYO): TISSUE TRANSGLUTAMINASE (TTG) ANTIBODIES, IGA, SERUM: <1.2 U/mL

## 2023-11-13 LAB — MAYO MISC TEST - REFRIG

## 2023-12-06 ENCOUNTER — Emergency Department (HOSPITAL_BASED_OUTPATIENT_CLINIC_OR_DEPARTMENT_OTHER)

## 2023-12-06 ENCOUNTER — Encounter (HOSPITAL_BASED_OUTPATIENT_CLINIC_OR_DEPARTMENT_OTHER): Payer: Self-pay

## 2023-12-06 ENCOUNTER — Inpatient Hospital Stay (HOSPITAL_COMMUNITY)

## 2023-12-06 ENCOUNTER — Inpatient Hospital Stay
Admission: EM | Admit: 2023-12-06 | Discharge: 2023-12-10 | DRG: 812 | Disposition: A | Discharge status: Home / Self Care | Attending: HOSPITALIST | Admitting: HOSPITALIST

## 2023-12-06 ENCOUNTER — Other Ambulatory Visit: Payer: Self-pay

## 2023-12-06 DIAGNOSIS — Z7951 Long term (current) use of inhaled steroids: Secondary | ICD-10-CM

## 2023-12-06 DIAGNOSIS — M797 Fibromyalgia: Secondary | ICD-10-CM | POA: Diagnosis present

## 2023-12-06 DIAGNOSIS — J449 Chronic obstructive pulmonary disease, unspecified: Secondary | ICD-10-CM | POA: Diagnosis present

## 2023-12-06 DIAGNOSIS — D509 Iron deficiency anemia, unspecified: Principal | ICD-10-CM | POA: Diagnosis present

## 2023-12-06 DIAGNOSIS — F332 Major depressive disorder, recurrent severe without psychotic features: Secondary | ICD-10-CM | POA: Diagnosis present

## 2023-12-06 DIAGNOSIS — D649 Anemia, unspecified: Principal | ICD-10-CM

## 2023-12-06 DIAGNOSIS — I2510 Atherosclerotic heart disease of native coronary artery without angina pectoris: Secondary | ICD-10-CM | POA: Diagnosis present

## 2023-12-06 DIAGNOSIS — Z79899 Other long term (current) drug therapy: Secondary | ICD-10-CM

## 2023-12-06 DIAGNOSIS — Z8673 Personal history of transient ischemic attack (TIA), and cerebral infarction without residual deficits: Secondary | ICD-10-CM

## 2023-12-06 DIAGNOSIS — Z7902 Long term (current) use of antithrombotics/antiplatelets: Secondary | ICD-10-CM

## 2023-12-06 DIAGNOSIS — R1084 Generalized abdominal pain: Secondary | ICD-10-CM

## 2023-12-06 DIAGNOSIS — I509 Heart failure, unspecified: Secondary | ICD-10-CM | POA: Diagnosis present

## 2023-12-06 DIAGNOSIS — N644 Mastodynia: Secondary | ICD-10-CM | POA: Diagnosis present

## 2023-12-06 DIAGNOSIS — E1143 Type 2 diabetes mellitus with diabetic autonomic (poly)neuropathy: Secondary | ICD-10-CM | POA: Diagnosis present

## 2023-12-06 DIAGNOSIS — R1030 Lower abdominal pain, unspecified: Secondary | ICD-10-CM

## 2023-12-06 DIAGNOSIS — Z903 Acquired absence of stomach [part of]: Secondary | ICD-10-CM

## 2023-12-06 DIAGNOSIS — R079 Chest pain, unspecified: Secondary | ICD-10-CM

## 2023-12-06 DIAGNOSIS — Z7984 Long term (current) use of oral hypoglycemic drugs: Secondary | ICD-10-CM

## 2023-12-06 DIAGNOSIS — K3184 Gastroparesis: Secondary | ICD-10-CM | POA: Diagnosis present

## 2023-12-06 DIAGNOSIS — I110 Hypertensive heart disease with heart failure: Secondary | ICD-10-CM | POA: Diagnosis present

## 2023-12-06 DIAGNOSIS — R531 Weakness: Secondary | ICD-10-CM

## 2023-12-06 DIAGNOSIS — K2100 Gastro-esophageal reflux disease with esophagitis, without bleeding: Secondary | ICD-10-CM | POA: Diagnosis present

## 2023-12-06 DIAGNOSIS — R519 Headache, unspecified: Secondary | ICD-10-CM

## 2023-12-06 DIAGNOSIS — Z794 Long term (current) use of insulin: Secondary | ICD-10-CM

## 2023-12-06 DIAGNOSIS — K909 Intestinal malabsorption, unspecified: Secondary | ICD-10-CM | POA: Diagnosis present

## 2023-12-06 DIAGNOSIS — Z7982 Long term (current) use of aspirin: Secondary | ICD-10-CM

## 2023-12-06 DIAGNOSIS — G4700 Insomnia, unspecified: Secondary | ICD-10-CM | POA: Diagnosis present

## 2023-12-06 DIAGNOSIS — K529 Noninfective gastroenteritis and colitis, unspecified: Secondary | ICD-10-CM | POA: Diagnosis present

## 2023-12-06 DIAGNOSIS — K648 Other hemorrhoids: Secondary | ICD-10-CM | POA: Diagnosis present

## 2023-12-06 DIAGNOSIS — R109 Unspecified abdominal pain: Secondary | ICD-10-CM | POA: Diagnosis present

## 2023-12-06 DIAGNOSIS — R42 Dizziness and giddiness: Secondary | ICD-10-CM

## 2023-12-06 DIAGNOSIS — Z7985 Long-term (current) use of injectable non-insulin antidiabetic drugs: Secondary | ICD-10-CM

## 2023-12-06 LAB — MANUAL DIFFERENTIAL
BASOPHIL %: 2 % (ref 0–3)
BASOPHIL ABSOLUTE: 0.07 x10ˆ3/uL (ref 0.00–0.30)
BASOPHILS MANUAL: 2
EOSINOPHIL %: 5 % (ref 0–7)
EOSINOPHIL ABSOLUTE: 0.19 x10ˆ3/uL (ref 0.00–0.80)
EOSINOPHILS MANUAL: 5
LYMPHOCYTE %: 35 % (ref 25–45)
LYMPHOCYTE ABSOLUTE: 1.3 x10ˆ3/uL (ref 1.10–5.00)
LYMPHOCYTES MANUAL: 35
MONOCYTE %: 10 % (ref 0–12)
MONOCYTE ABSOLUTE: 0.37 x10ˆ3/uL — ABNORMAL HIGH (ref 0.00–0.30)
MONOCYTES MANUAL: 10
NEUTROPHIL %: 48 % (ref 40–76)
NEUTROPHIL ABSOLUTE: 1.78 x10ˆ3/uL — ABNORMAL LOW (ref 1.80–8.40)
NEUTROPHILS MANUAL: 48
PLATELET MORPHOLOGY COMMENT: NORMAL
SCHISTOCYTES: ABSENT
TOTAL CELLS COUNTED [#] IN BLOOD: 100
WBC: 3.7 x10ˆ3/uL

## 2023-12-06 LAB — TYPE AND SCREEN
ABO/RH(D): O POS
ANTIBODY SCREEN: NEGATIVE

## 2023-12-06 LAB — COMPREHENSIVE METABOLIC PANEL, NON-FASTING
ALBUMIN/GLOBULIN RATIO: 0.7 — ABNORMAL LOW (ref 0.8–1.4)
ALBUMIN: 2.7 g/dL — ABNORMAL LOW (ref 3.4–5.0)
ALKALINE PHOSPHATASE: 96 U/L (ref 46–116)
ALT (SGPT): 18 U/L (ref <=78)
ANION GAP: 10 mmol/L (ref 4–13)
AST (SGOT): 14 U/L — ABNORMAL LOW (ref 15–37)
BILIRUBIN TOTAL: 0.2 mg/dL (ref 0.2–1.0)
BUN/CREA RATIO: 14
BUN: 12 mg/dL (ref 7–18)
CALCIUM, CORRECTED: 10 mg/dL
CALCIUM: 9 mg/dL (ref 8.5–10.1)
CHLORIDE: 108 mmol/L — ABNORMAL HIGH (ref 98–107)
CO2 TOTAL: 24 mmol/L (ref 21–32)
CREATININE: 0.86 mg/dL (ref 0.55–1.02)
ESTIMATED GFR: 77 mL/min/1.73mˆ2 (ref >59)
GLOBULIN: 3.8
GLUCOSE: 75 mg/dL (ref 74–106)
OSMOLALITY, CALCULATED: 282 mosm/kg (ref 270–290)
POTASSIUM: 3.9 mmol/L (ref 3.5–5.1)
PROTEIN TOTAL: 6.5 g/dL (ref 6.4–8.2)
SODIUM: 142 mmol/L (ref 136–145)

## 2023-12-06 LAB — URINALYSIS, MACRO/MICRO
BILIRUBIN: NEGATIVE mg/dL
BLOOD: NEGATIVE mg/dL
GLUCOSE: NEGATIVE mg/dL
KETONES: NEGATIVE mg/dL
LEUKOCYTES: NEGATIVE WBCs/uL
NITRITE: NEGATIVE
PH: 6 (ref 4.6–8.0)
PROTEIN: NEGATIVE mg/dL
SPECIFIC GRAVITY: <=1.005 (ref 1.003–1.035)
UROBILINOGEN: 0.2 mg/dL (ref 0.2–1.0)

## 2023-12-06 LAB — CBC WITH DIFF
HCT: 24.6 % — ABNORMAL LOW (ref 31.2–41.9)
HGB: 7.9 g/dL — ABNORMAL LOW (ref 10.9–14.3)
MCH: 24.6 pg — ABNORMAL LOW (ref 24.7–32.8)
MCHC: 32.2 g/dL — ABNORMAL LOW (ref 32.3–35.6)
MCV: 76.5 fL (ref 75.5–95.3)
MPV: 7.8 fL — ABNORMAL LOW (ref 7.9–10.8)
PLATELETS: 323 x10ˆ3/uL (ref 140–440)
RBC: 3.22 x10ˆ6/uL — ABNORMAL LOW (ref 3.63–4.92)
RDW: 18.2 % — ABNORMAL HIGH (ref 12.3–17.7)
WBC: 3.7 x10ˆ3/uL — ABNORMAL LOW (ref 3.8–11.8)

## 2023-12-06 LAB — RETICULOCYTE COUNT
IMMATURE RETIC FRACTION: 0.44 (ref 0.26–0.52)
RETICULOCYTE % AUTOMATED: 0.99 % (ref 0.51–2.17)
RETICULOCYTES COUNT # AUTOMATED: 0.0337 10(6)/uL (ref 0.0221–0.0963)

## 2023-12-06 LAB — TROPONIN-I
TROPONIN I: 2 ng/L (ref <15)
TROPONIN I: 2 ng/L (ref <15)

## 2023-12-06 LAB — H & H
HCT: 26 % — ABNORMAL LOW (ref 31.2–41.9)
HGB: 8.4 g/dL — ABNORMAL LOW (ref 10.9–14.3)

## 2023-12-06 LAB — OCCULT BLOOD, STOOL (IFOB): IFOB: NEGATIVE

## 2023-12-06 LAB — PT/INR
INR: 1.03 (ref 0.84–1.10)
PROTHROMBIN TIME: 11.6 s (ref 9.8–12.7)

## 2023-12-06 LAB — MAGNESIUM: MAGNESIUM: 1.9 mg/dL (ref 1.8–2.4)

## 2023-12-06 LAB — LIPASE: LIPASE: 13 U/L — ABNORMAL LOW (ref 16–77)

## 2023-12-06 LAB — PTT (PARTIAL THROMBOPLASTIN TIME): APTT: 34.9 s (ref 25.0–38.0)

## 2023-12-06 MED ORDER — ONDANSETRON HCL (PF) 4 MG/2 ML INJECTION SOLUTION
4.0000 mg | INTRAMUSCULAR | Status: AC
Start: 2023-12-06 — End: 2023-12-06
  Administered 2023-12-06: 4 mg via INTRAVENOUS

## 2023-12-06 MED ORDER — MORPHINE 4 MG/ML INJECTION WRAPPER
INJECTION | INTRAMUSCULAR | Status: AC
Start: 2023-12-06 — End: 2023-12-06
  Filled 2023-12-06: qty 1

## 2023-12-06 MED ORDER — ONDANSETRON HCL (PF) 4 MG/2 ML INJECTION SOLUTION
INTRAMUSCULAR | Status: AC
Start: 2023-12-06 — End: 2023-12-06
  Filled 2023-12-06: qty 2

## 2023-12-06 MED ORDER — MORPHINE 4 MG/ML INJECTION WRAPPER
4.0000 mg | INJECTION | INTRAMUSCULAR | Status: AC
Start: 2023-12-06 — End: 2023-12-06
  Administered 2023-12-06: 4 mg via INTRAVENOUS

## 2023-12-06 MED ORDER — SODIUM CHLORIDE 0.9 % IV BOLUS
1000.0000 mL | INJECTION | Status: AC
Start: 2023-12-06 — End: 2023-12-06
  Administered 2023-12-06: 1000 mL via INTRAVENOUS
  Administered 2023-12-06: 0 mL via INTRAVENOUS

## 2023-12-06 NOTE — Respiratory Therapy (Signed)
 1654-  ISTAT CG4+ results are as follows:              Ph= 7.371              PCO2= 37.3              PO2= 73              BE= -4              HCO3= 21.6              TCO2= 23               O2 Sat= 94%                Lac= 0.65  Reported results to [employer].

## 2023-12-06 NOTE — ED Provider Notes (Signed)
 [facility], [place] - Emergency Department  ED Primary Note  History of Present Illness   Martha White is a 58 y.o. female who had concerns including Abnormal Lab Result. Pt states she has constant abd pain and weakness. Saw PCP a few mo ago and found to be anemic. PCP wanted her to have head ct as well. Denies frank blood in urine or stool.  Review of Systems   Constitutional: No fever, chills + weakness   Skin: No rash or diaphoresis  HENT: + headaches,  no congestion  Eyes: No vision changes or photophobia   Cardio: No chest pain, palpitations or leg swelling   Respiratory: No cough, wheezing or SOB  GI:  + abd pain  nausea, vomiting stool changes  GU:  No dysuria, hematuria, or increased frequency  MSK: No muscle aches, joint or back pain  Neuro: No seizures, LOC, numbness, tingling, or focal weakness  Psychiatric: No depression, SI or substance abuse  All other systems reviewed and are negative.    Physical Exam   ED Triage Vitals [12/06/23 1247]   BP (Non-Invasive) 128/87   Heart Rate 98   Respiratory Rate 19   Temperature 36.2 C (97.2 F)   SpO2 98 %   Weight 73.9 kg (163 lb)   Height 1.511 m (4' 11.5)     Constitutional:  58 y.o. female who appears in no distress. Pale color, no cyanosis.   HENT:   Head: Normocephalic and atraumatic.   Mouth/Throat: Oropharynx is clear and dry .   Eyes: EOMI, PERRL   Neck: Trachea midline. Neck supple.  Cardiovascular: RRR, No murmurs, rubs or gallops. Intact distal pulses.  Pulmonary/Chest: BS equal bilaterally. No respiratory distress. No wheezes, rales or chest tenderness.   Abdominal: Bowel sounds present and normal. Abdomen soft, mid lower abd  tenderness, no rebound and no guarding.  Back: No midline spinal tenderness, no paraspinal tenderness, no CVA tenderness.           Musculoskeletal: No edema, tenderness or deformity.  Skin: warm and dry. No rash, erythema, pallor or cyanosis  Psychiatric: normal mood and affect. Behavior is normal.    Neurological: Patient keenly alert and responsive, easily able to raise eyebrows, facial muscles/expressions symmetric, speaking in fluent sentences, moving all extremities equally and fully, normal gait  Patient Data   Labs Ordered/Reviewed   COMPREHENSIVE METABOLIC PANEL, NON-FASTING - Abnormal; Notable for the following components:       Result Value    CHLORIDE 108 (*)     ALBUMIN 2.7 (*)     AST (SGOT) 14 (*)     ALBUMIN/GLOBULIN RATIO 0.7 (*)     All other components within normal limits    Narrative:     Estimated Glomerular Filtration Rate (eGFR) is calculated using the CKD-EPI (2021) equation, intended for patients 46 years of age and older. If gender is not documented or unknown, there will be no eGFR calculation.   LIPASE - Abnormal; Notable for the following components:    LIPASE 13 (*)     All other components within normal limits   CBC WITH DIFF - Abnormal; Notable for the following components:    WBC 3.7 (*)     RBC 3.22 (*)     HGB 7.9 (*)     HCT 24.6 (*)     MCH 24.6 (*)     MCHC 32.2 (*)     RDW 18.2 (*)     MPV 7.8 (*)  All other components within normal limits   MANUAL DIFFERENTIAL - Abnormal; Notable for the following components:    NEUTROPHIL ABSOLUTE 1.78 (*)     MONOCYTE ABSOLUTE 0.37 (*)     All other components within normal limits   PT/INR - Normal    Narrative:     In the setting of warfarin therapy, a moderate-intensity INR goal range is 2.0 to 3.0 and a high-intensity INR goal range is 2.5 to 3.5.    INR is ONLY validated to determine the level of anticoagulation with vitamin K antagonists (warfarin). Other factors may elevate the INR including but not limited to direct oral anticoagulants (DOACs), liver dysfunction, vitamin K deficiency, DIC, factor deficiencies, and factor inhibitors.   PTT (PARTIAL THROMBOPLASTIN TIME) - Normal   TROPONIN-I - Normal    Narrative:     Values received on females ranging between 12-15 ng/L MUST include the next serial troponin to review changes  in the delta differences as the reference range for the Access II chemistry analyzer is lower than the established reference range.     MAGNESIUM  - Normal   RETICULOCYTE COUNT - Normal   TROPONIN-I - Normal    Narrative:     Values received on females ranging between 12-15 ng/L MUST include the next serial troponin to review changes in the delta differences as the reference range for the Access II chemistry analyzer is lower than the established reference range.     ROUTINE STOOL CULTURE (INCLUDING E. COLI SHIGA TOXIN)   CBC/DIFF    Narrative:     The following orders were created for panel order CBC/DIFF.  Procedure                               Abnormality         Status                     ---------                               -----------         ------                     CBC WITH IPQQ[[id number]]                Abnormal            Final result               MANUAL DIFFERENTIAL[[id number]]          Abnormal            Final result                 Please view results for these tests on the individual orders.   URINALYSIS WITH REFLEX MICROSCOPIC AND CULTURE IF POSITIVE    Narrative:     The following orders were created for panel order URINALYSIS WITH REFLEX MICROSCOPIC AND CULTURE IF POSITIVE.  Procedure                               Abnormality         Status                     ---------                               -----------         ------  URINALYSIS, MACRO/MICRO[[id number]]                                                       Please view results for these tests on the individual orders.   URINALYSIS, MACRO/MICRO   OCCULT BLOOD, STOOL   H & H   PERFORM POC BLOOD GAS - CG4   TYPE AND SCREEN     CT BRAIN WO IV CONTRAST   Final Result by Edi, Radresults In (09/19 1529)   NO ACUTE FINDINGS         Radiologist location ID: [id number]         XR AP MOBILE CHEST   Final Result by Edi, Radresults In (09/19 1344)   MINIMAL SCARRING OR ATELECTASIS AT THE MID LEFT LUNG.                   Radiologist location ID: [id number]         CT ABDOMEN PELVIS WO IV CONTRAST   Final Result by Edi, Radresults In (09/19 1342)   1. NO ACUTE FINDINGS   2. FOCAL PROMINENCE OF THE COLON WALL IN THE TRANSVERSE COLON. CONSIDER COLONOSCOPY TO EXCLUDE A COLON LESION.            Radiologist location ID: [id number]           Medical Decision Making   Diff dx of anemia gi bleed dehydration gastritis .   Hgb 9.9 on 10/02/23 PCP pt how B12, d and iron  at that time. Dr stafford accepted pt. She denies cp at present. Had colonoscopy about 6 mo ago  .   Medications Ordered/Administered in the ED   NS bolus infusion 1,000 mL (1,000 mL Intravenous New Bag/New Syringe 12/06/23 1415)   ondansetron  (ZOFRAN ) 2 mg/mL injection (4 mg Intravenous Given 12/06/23 1602)   morphine  4 mg/mL injection (4 mg Intravenous Given 12/06/23 1602)   HCG is now 7.9   Clinical Impression   Symptomatic anemia (Primary)   Lower abdominal pain       Disposition: Admitted

## 2023-12-06 NOTE — ED Nurses Note (Signed)
 Patient resting in bed watching television with husband at bedside. Patient provided with ice chips and water  per request. No further concerns or complaints noted at this time.

## 2023-12-06 NOTE — ED Nurses Note (Signed)
 Patient reports generalized pain all over at this time, provider made aware

## 2023-12-06 NOTE — ED Nurses Note (Signed)
 Provider in progress of getting pt admitted to inpatient a [facility]

## 2023-12-07 ENCOUNTER — Inpatient Hospital Stay (HOSPITAL_COMMUNITY)

## 2023-12-07 DIAGNOSIS — R9431 Abnormal electrocardiogram [ECG] [EKG]: Secondary | ICD-10-CM

## 2023-12-07 DIAGNOSIS — E11649 Type 2 diabetes mellitus with hypoglycemia without coma: Secondary | ICD-10-CM

## 2023-12-07 DIAGNOSIS — E1143 Type 2 diabetes mellitus with diabetic autonomic (poly)neuropathy: Secondary | ICD-10-CM

## 2023-12-07 DIAGNOSIS — M797 Fibromyalgia: Secondary | ICD-10-CM

## 2023-12-07 DIAGNOSIS — J4489 Other specified chronic obstructive pulmonary disease: Secondary | ICD-10-CM

## 2023-12-07 DIAGNOSIS — Z7985 Long-term (current) use of injectable non-insulin antidiabetic drugs: Secondary | ICD-10-CM

## 2023-12-07 DIAGNOSIS — G629 Polyneuropathy, unspecified: Secondary | ICD-10-CM

## 2023-12-07 DIAGNOSIS — D649 Anemia, unspecified: Principal | ICD-10-CM

## 2023-12-07 DIAGNOSIS — R42 Dizziness and giddiness: Secondary | ICD-10-CM

## 2023-12-07 DIAGNOSIS — I2510 Atherosclerotic heart disease of native coronary artery without angina pectoris: Secondary | ICD-10-CM

## 2023-12-07 DIAGNOSIS — Z7984 Long term (current) use of oral hypoglycemic drugs: Secondary | ICD-10-CM

## 2023-12-07 DIAGNOSIS — R109 Unspecified abdominal pain: Secondary | ICD-10-CM | POA: Diagnosis present

## 2023-12-07 DIAGNOSIS — Z794 Long term (current) use of insulin: Secondary | ICD-10-CM

## 2023-12-07 DIAGNOSIS — I110 Hypertensive heart disease with heart failure: Secondary | ICD-10-CM

## 2023-12-07 LAB — BASIC METABOLIC PANEL
ANION GAP: 5 mmol/L (ref 4–13)
BUN/CREA RATIO: 14 (ref 6–22)
BUN: 14 mg/dL (ref 7–25)
CALCIUM: 8.8 mg/dL (ref 8.6–10.3)
CHLORIDE: 108 mmol/L — ABNORMAL HIGH (ref 98–107)
CO2 TOTAL: 23 mmol/L (ref 21–31)
CREATININE: 1.03 mg/dL (ref 0.60–1.30)
ESTIMATED GFR: 62 mL/min/1.73mˆ2 (ref >59)
GLUCOSE: 147 mg/dL — ABNORMAL HIGH (ref 74–109)
OSMOLALITY, CALCULATED: 275 mosm/kg (ref 270–290)
POTASSIUM: 4.2 mmol/L (ref 3.5–5.1)
SODIUM: 136 mmol/L (ref 136–145)

## 2023-12-07 LAB — CBC WITH DIFF
BASOPHIL #: 0 x10ˆ3/uL (ref 0.00–0.10)
BASOPHIL %: 1 % (ref 0–1)
EOSINOPHIL #: 0.2 x10ˆ3/uL (ref 0.00–0.50)
EOSINOPHIL %: 4 % (ref 1–7)
HCT: 25.8 % — ABNORMAL LOW (ref 31.2–41.9)
HGB: 8.4 g/dL — ABNORMAL LOW (ref 10.9–14.3)
LYMPHOCYTE #: 1.1 x10ˆ3/uL (ref 1.10–3.10)
LYMPHOCYTE %: 28 % (ref 16–46)
MCH: 24 pg — ABNORMAL LOW (ref 24.7–32.8)
MCHC: 32.7 g/dL (ref 32.3–35.6)
MCV: 73.3 fL — ABNORMAL LOW (ref 75.5–95.3)
MONOCYTE #: 0.5 x10ˆ3/uL (ref 0.20–0.90)
MONOCYTE %: 12 % — ABNORMAL HIGH (ref 4–11)
MPV: 7.5 fL — ABNORMAL LOW (ref 7.9–10.8)
NEUTROPHIL #: 2.3 x10ˆ3/uL (ref 1.90–8.20)
NEUTROPHIL %: 55 % (ref 43–77)
PLATELETS: 341 x10ˆ3/uL (ref 140–440)
RBC: 3.51 x10ˆ6/uL — ABNORMAL LOW (ref 3.63–4.92)
RDW: 16.5 % (ref 12.3–17.7)
WBC: 4.1 x10ˆ3/uL (ref 3.8–11.8)

## 2023-12-07 LAB — ECG 12 LEAD
Atrial Rate: 92 {beats}/min
Calculated P Axis: 26 degrees
Calculated R Axis: -15 degrees
Calculated T Axis: 10 degrees
PR Interval: 162 ms
QRS Duration: 82 ms
QT Interval: 338 ms
QTC Calculation: 417 ms
Ventricular rate: 92 {beats}/min

## 2023-12-07 LAB — POC BLOOD GLUCOSE (RESULTS)
GLUCOSE, POC: 59 mg/dL — ABNORMAL LOW (ref 70–100)
GLUCOSE, POC: 106 mg/dL — ABNORMAL HIGH (ref 70–100)
GLUCOSE, POC: 56 mg/dL — ABNORMAL LOW (ref 70–100)
GLUCOSE, POC: 125 mg/dL — ABNORMAL HIGH (ref 70–100)
GLUCOSE, POC: 78 mg/dL (ref 70–100)
GLUCOSE, POC: 112 mg/dL — ABNORMAL HIGH (ref 70–100)
GLUCOSE, POC: 151 mg/dL — ABNORMAL HIGH (ref 70–100)

## 2023-12-07 LAB — IRON TRANSFERRIN AND TIBC
IRON (TRANSFERRIN) SATURATION: 5 % — ABNORMAL LOW (ref 15–50)
IRON: 25 ug/dL — ABNORMAL LOW (ref 50–212)
TOTAL IRON BINDING CAPACITY: 473 ug/dL — ABNORMAL HIGH (ref 250–450)
TRANSFERRIN: 338 mg/dL (ref 203–362)
UIBC: 448 ug/dL — ABNORMAL HIGH (ref 130–375)

## 2023-12-07 LAB — PHOSPHORUS: PHOSPHORUS: 3.8 mg/dL (ref 3.7–7.2)

## 2023-12-07 LAB — FERRITIN: FERRITIN: 6 ng/mL — ABNORMAL LOW (ref 11–307)

## 2023-12-07 LAB — SEDIMENTATION RATE: ERYTHROCYTE SEDIMENTATION RATE (ESR): 33 mm/h — ABNORMAL HIGH (ref <30)

## 2023-12-07 LAB — FOLATE: FOLATE: 29.3 ng/mL — ABNORMAL HIGH (ref 5.9–24.8)

## 2023-12-07 LAB — MAGNESIUM: MAGNESIUM: 1.9 mg/dL (ref 1.9–2.7)

## 2023-12-07 LAB — VITAMIN B12: VITAMIN B 12: 657 pg/mL (ref 180–914)

## 2023-12-07 MED ORDER — DULOXETINE 30 MG CAPSULE,DELAYED RELEASE
30.0000 mg | DELAYED_RELEASE_CAPSULE | Freq: Two times a day (BID) | ORAL | Status: DC
Start: 2023-12-07 — End: 2023-12-08
  Administered 2023-12-07 – 2023-12-08 (×3): 30 mg via ORAL
  Filled 2023-12-07 (×3): qty 1

## 2023-12-07 MED ORDER — SODIUM CHLORIDE 0.9 % INTRAVENOUS SOLUTION
300.0000 mg | Freq: Every day | INTRAVENOUS | Status: AC
Start: 2023-12-07 — End: 2023-12-10
  Administered 2023-12-07: 0 mg via INTRAVENOUS
  Administered 2023-12-07 – 2023-12-08 (×2): 300 mg via INTRAVENOUS
  Administered 2023-12-08 – 2023-12-09 (×2): 0 mg via INTRAVENOUS
  Administered 2023-12-09: 300 mg via INTRAVENOUS
  Filled 2023-12-07 (×3): qty 15

## 2023-12-07 MED ORDER — LIDOCAINE 4 % TOPICAL PATCH
1.0000 | MEDICATED_PATCH | CUTANEOUS | Status: AC
Start: 2023-12-07 — End: 2023-12-07
  Administered 2023-12-07: 1 via TRANSDERMAL

## 2023-12-07 MED ORDER — INSULIN LISPRO 100 UNIT/ML SUB-Q SSIP VIAL
1.0000 [IU] | INJECTION | Freq: Four times a day (QID) | SUBCUTANEOUS | Status: DC
Start: 2023-12-07 — End: 2023-12-10
  Administered 2023-12-07 – 2023-12-09 (×11): 0 [IU] via SUBCUTANEOUS
  Administered 2023-12-09: 1 [IU] via SUBCUTANEOUS
  Administered 2023-12-10 (×2): 0 [IU] via SUBCUTANEOUS
  Filled 2023-12-07: qty 1

## 2023-12-07 MED ORDER — PREGABALIN 50 MG CAPSULE
50.0000 mg | ORAL_CAPSULE | Freq: Every evening | ORAL | Status: DC
Start: 2023-12-07 — End: 2023-12-10
  Administered 2023-12-07 – 2023-12-09 (×3): 50 mg via ORAL
  Filled 2023-12-07 (×3): qty 1

## 2023-12-07 MED ORDER — TRAZODONE 100 MG TABLET
100.0000 mg | ORAL_TABLET | Freq: Every evening | ORAL | Status: DC
Start: 2023-12-07 — End: 2023-12-10
  Administered 2023-12-07 – 2023-12-09 (×3): 100 mg via ORAL
  Filled 2023-12-07 (×3): qty 1

## 2023-12-07 MED ORDER — GLUCAGON HCL 1 MG/ML SOLUTION FOR INJECTION
1.0000 mg | Freq: Once | INTRAMUSCULAR | Status: DC | PRN
Start: 2023-12-07 — End: 2023-12-10

## 2023-12-07 MED ORDER — ONDANSETRON HCL (PF) 4 MG/2 ML INJECTION SOLUTION
4.0000 mg | Freq: Four times a day (QID) | INTRAMUSCULAR | Status: DC | PRN
Start: 2023-12-07 — End: 2023-12-10
  Administered 2023-12-07 – 2023-12-10 (×5): 4 mg via INTRAVENOUS
  Filled 2023-12-07 (×6): qty 2

## 2023-12-07 MED ORDER — DEXTROSE 40 % ORAL GEL
15.0000 g | ORAL | Status: DC | PRN
Start: 2023-12-07 — End: 2023-12-10

## 2023-12-07 MED ORDER — AMLODIPINE 10 MG TABLET
10.0000 mg | ORAL_TABLET | Freq: Every day | ORAL | Status: DC
Start: 2023-12-07 — End: 2023-12-10
  Administered 2023-12-07 – 2023-12-10 (×4): 10 mg via ORAL
  Filled 2023-12-07 (×4): qty 1

## 2023-12-07 MED ORDER — TRAMADOL 50 MG TABLET
50.0000 mg | ORAL_TABLET | Freq: Four times a day (QID) | ORAL | Status: DC | PRN
Start: 2023-12-07 — End: 2023-12-10
  Administered 2023-12-07 – 2023-12-10 (×5): 50 mg via ORAL
  Filled 2023-12-07 (×5): qty 1

## 2023-12-07 MED ORDER — PANTOPRAZOLE 40 MG INTRAVENOUS SOLUTION
40.0000 mg | Freq: Two times a day (BID) | INTRAVENOUS | Status: DC
Start: 2023-12-07 — End: 2023-12-10
  Administered 2023-12-07 – 2023-12-10 (×7): 40 mg via INTRAVENOUS
  Filled 2023-12-07 (×8): qty 10

## 2023-12-07 MED ORDER — ACETAMINOPHEN 325 MG TABLET
650.0000 mg | ORAL_TABLET | ORAL | Status: DC | PRN
Start: 2023-12-07 — End: 2023-12-10
  Administered 2023-12-08 (×2): 650 mg via ORAL
  Filled 2023-12-07 (×2): qty 2

## 2023-12-07 MED ORDER — ACETAMINOPHEN 325 MG TABLET
ORAL_TABLET | ORAL | Status: AC
Start: 2023-12-07 — End: 2023-12-07
  Filled 2023-12-07: qty 3

## 2023-12-07 MED ORDER — ROPINIROLE 1 MG TABLET
1.0000 mg | ORAL_TABLET | Freq: Three times a day (TID) | ORAL | Status: DC
Start: 2023-12-07 — End: 2023-12-10
  Administered 2023-12-07 – 2023-12-10 (×10): 1 mg via ORAL
  Filled 2023-12-07 (×10): qty 1

## 2023-12-07 MED ORDER — PREGABALIN 25 MG CAPSULE
25.0000 mg | ORAL_CAPSULE | Freq: Once | ORAL | Status: AC
Start: 2023-12-07 — End: 2023-12-07
  Administered 2023-12-07: 25 mg via ORAL
  Filled 2023-12-07: qty 1

## 2023-12-07 MED ORDER — CYCLOBENZAPRINE 10 MG TABLET
ORAL_TABLET | ORAL | Status: AC
Start: 2023-12-07 — End: 2023-12-07
  Filled 2023-12-07: qty 1

## 2023-12-07 MED ORDER — LORAZEPAM 2 MG/ML INJECTION WRAPPER
0.5000 mg | INTRAMUSCULAR | Status: DC
Start: 2023-12-07 — End: 2023-12-07

## 2023-12-07 MED ORDER — METHOCARBAMOL 500 MG TABLET
500.0000 mg | ORAL_TABLET | Freq: Four times a day (QID) | ORAL | Status: DC | PRN
Start: 2023-12-07 — End: 2023-12-10
  Administered 2023-12-07 – 2023-12-08 (×2): 500 mg via ORAL
  Filled 2023-12-07 (×2): qty 1

## 2023-12-07 MED ORDER — HYDROMORPHONE 2 MG/ML INJECTION WRAPPER
0.5000 mg | INJECTION | INTRAMUSCULAR | Status: DC | PRN
Start: 2023-12-07 — End: 2023-12-10
  Administered 2023-12-07 – 2023-12-10 (×11): 0.5 mg via INTRAVENOUS
  Filled 2023-12-07 (×11): qty 1

## 2023-12-07 MED ORDER — DEXTROSE 50 % IN WATER (D50W) INTRAVENOUS SYRINGE
12.5000 g | INJECTION | INTRAVENOUS | Status: DC | PRN
Start: 2023-12-07 — End: 2023-12-10
  Administered 2023-12-09: 25 mL via INTRAVENOUS
  Filled 2023-12-07: qty 50

## 2023-12-07 MED ORDER — ACETAMINOPHEN 325 MG TABLET
975.0000 mg | ORAL_TABLET | ORAL | Status: AC
Start: 2023-12-07 — End: 2023-12-07
  Administered 2023-12-07: 975 mg via ORAL

## 2023-12-07 MED ORDER — ATORVASTATIN 40 MG TABLET
80.0000 mg | ORAL_TABLET | Freq: Every day | ORAL | Status: DC
Start: 2023-12-07 — End: 2023-12-10
  Administered 2023-12-07 – 2023-12-10 (×4): 80 mg via ORAL
  Filled 2023-12-07 (×4): qty 2

## 2023-12-07 MED ORDER — CHOLECALCIFEROL (VITAMIN D3) 25 MCG (1,000 UNIT) TABLET
1000.0000 [IU] | ORAL_TABLET | Freq: Every day | ORAL | Status: DC
Start: 2023-12-07 — End: 2023-12-10
  Administered 2023-12-07 – 2023-12-10 (×4): 1000 [IU] via ORAL
  Filled 2023-12-07 (×4): qty 1

## 2023-12-07 MED ORDER — HEPARIN (PORCINE) 5,000 UNIT/ML INJECTION SOLUTION
5000.0000 [IU] | Freq: Three times a day (TID) | INTRAMUSCULAR | Status: DC
Start: 2023-12-07 — End: 2023-12-07
  Administered 2023-12-07: 5000 [IU] via SUBCUTANEOUS
  Filled 2023-12-07: qty 1

## 2023-12-07 MED ORDER — LIDOCAINE 4 % TOPICAL PATCH
MEDICATED_PATCH | CUTANEOUS | Status: AC
Start: 2023-12-07 — End: 2023-12-07
  Filled 2023-12-07: qty 1

## 2023-12-07 MED ORDER — DIAZEPAM 5 MG/ML INJECTION SYRINGE
2.5000 mg | INJECTION | INTRAMUSCULAR | Status: AC
Start: 2023-12-07 — End: 2023-12-07
  Administered 2023-12-07: 2.5 mg via INTRAVENOUS
  Filled 2023-12-07: qty 2

## 2023-12-07 MED ORDER — DEXTROSE 50 % IN WATER (D50W) INTRAVENOUS SYRINGE
25.0000 g | INJECTION | Freq: Once | INTRAVENOUS | Status: DC
Start: 2023-12-07 — End: 2023-12-10
  Administered 2023-12-07: 0 mL via INTRAVENOUS
  Filled 2023-12-07: qty 50

## 2023-12-07 MED ORDER — DULOXETINE 20 MG CAPSULE,DELAYED RELEASE
20.0000 mg | DELAYED_RELEASE_CAPSULE | Freq: Every day | ORAL | Status: DC
Start: 2023-12-07 — End: 2023-12-07

## 2023-12-07 MED ORDER — DEXTROSE 50 % IN WATER (D50W) INTRAVENOUS SYRINGE
12.5000 g | INJECTION | INTRAVENOUS | Status: DC | PRN
Start: 2023-12-07 — End: 2023-12-10

## 2023-12-07 MED ORDER — METOPROLOL SUCCINATE ER 50 MG TABLET,EXTENDED RELEASE 24 HR
50.0000 mg | ORAL_TABLET | Freq: Every day | ORAL | Status: DC
Start: 2023-12-07 — End: 2023-12-10
  Administered 2023-12-07 – 2023-12-10 (×4): 50 mg via ORAL
  Filled 2023-12-07 (×4): qty 1

## 2023-12-07 MED ORDER — CYCLOBENZAPRINE 10 MG TABLET
10.0000 mg | ORAL_TABLET | ORAL | Status: AC
Start: 2023-12-07 — End: 2023-12-07
  Administered 2023-12-07: 10 mg via ORAL

## 2023-12-07 NOTE — Nurses Notes (Signed)
 Latest Reference Range & Units 12/07/23 05:46   GLUCOSE, POC 70 - 100 mg/dl 56 (L)   (L): Data is abnormally low    Notified night shift covering providers notified of patient recheck glucose. Patient currently does not have orders in place for hypoglycemic management. Requesting orders be placed.

## 2023-12-07 NOTE — Care Plan (Signed)
 [name]  Care Plan Note    Situation: Abdominal pain    Intervention: Patient report nausea. PRN Zofran  administered per MAR. Patient report 10/10 generalized pain. PRN dilaudid  administered per MAR. [facility]. Patient had abnormal labs results and reports a shock-like sensation on the left side of her body. Patient lab Hgb 8.4. Patient has a chest port to right chest. Lidocaine  patch to right shoulder. Patient ambulates independently in room.    Response: GI work up this admission.       Martha Alesa Echevarria, RN          Problem: Adult Inpatient Plan of Care  Goal: Plan of Care Review  Outcome: Ongoing (see interventions/notes)  Goal: Patient-Specific Goal (Individualized)  Outcome: Ongoing (see interventions/notes)  Flowsheets (Taken 12/07/2023 0335)  Individualized Care Needs: has chest port accessed in ER  Anxieties, Fears or Concerns: concerned about lab work  Goal: Absence of Hospital-Acquired Illness or Injury  Outcome: Ongoing (see interventions/notes)  Intervention: Identify and Manage Fall Risk  Recent Flowsheet Documentation  Taken 12/07/2023 0335 by Mare ORN, RN  Safety Promotion/Fall Prevention:   activity supervised   fall prevention program maintained   motion sensor pad activated   nonskid shoes/slippers when out of bed   safety round/check completed  Intervention: Prevent Skin Injury  Recent Flowsheet Documentation  Taken 12/07/2023 0335 by Mare ORN, RN  Body Position: supine, head elevated  Skin Protection:   adhesive use limited   tubing/devices free from skin contact   transparent dressing maintained  Intervention: Prevent and Manage VTE (Venous Thromboembolism) Risk  Recent Flowsheet Documentation  Taken 12/07/2023 0335 by Mare ORN, RN  VTE Prevention/Management: dorsiflexion/plantar flexion performed  Goal: Optimal Comfort and Wellbeing  Outcome: Ongoing (see interventions/notes)  Goal: Rounds/Family Conference  Outcome: Ongoing (see  interventions/notes)     Problem: Pain Acute  Goal: Optimal Pain Control and Function  Outcome: Ongoing (see interventions/notes)  Intervention: Prevent or Manage Pain  Recent Flowsheet Documentation  Taken 12/07/2023 0335 by Mare ORN, RN  Sleep/Rest Enhancement: awakenings minimized  Medication Review/Management: medications reviewed

## 2023-12-07 NOTE — ED Nurses Note (Signed)
 Report called to [facility] on 3E at [facility]. Patient leaving with PRS at this time.

## 2023-12-07 NOTE — Progress Notes (Signed)
 Patient admitted last night for symptomatic anemia.  Started on Protonix  IV.  Patient had severe iron -deficiency, has a history of partial gastrectomy.  Started on Venofer  IV.  CT abdomen showed focal prominence in transverse colon and recommended colonoscopy.  GI consulted.    Patient was complaining of pain all over the body likely fibromyalgia, she was also complaining of shooting pain in her left leg and left arm.  She was started on Lyrica , continue with duloxetine .

## 2023-12-07 NOTE — H&P (Signed)
 [place] MEDICINE [facility]    HOSPITALIST H&P    Martha White 58 y.o. female OTF/OTF   Date of Service: 12/07/2023    Date of Admission:  12/06/2023   PCP: Niels DELENA Picker, CNP Code Status:No Order       Chief Complaint:   Chief Complaint   Patient presents with    Abnormal Lab Result     Patient reports Electric shock through whole left side ongoing x4 days, reports WBC, RBC, and HGB low on labs. Sent from PCP for repeat labs and stroke r/o.        HPI:   Martha White is a 58 y.o. female with a medical history of asthma, CHF, Chronic Obstructive Pulmonary Disease, type 2 diabetes, hypertension gastroparesis, fibromyalgia, depression  who presented to the emergency room at [facility] complaining of abdominal pain and weakness.  Patient tells me she has been having like electrical shock sensation to right arm and right leg for over a week.  She has history of gastroparesis she normally has a appointment with her GI doctor for management.  She went to see her GI doctor who reviewed her labs and advise her to come to the emergency room for evaluation.  Patient denies flank pain, blood in the urine or in the stool.  A CT of the brain obtained in the ED, reviewed is negative for acute findings.  A CT of the abdomen and pelvis in the ED is negative for acute findings but revealed focal prominence of the colon wall in the transverse colon, consider colonoscopy to exclude a colon lesion.  Labs reviewed showed hemoglobin 8.4, WBC 3.7, albumin 2.7.      ED medications:   Medications Ordered/Administered in the ED   NS bolus infusion 1,000 mL (0 mL Intravenous Stopped 12/06/23 1515)   ondansetron  (ZOFRAN ) 2 mg/mL injection (4 mg Intravenous Given 12/06/23 1602)   morphine  4 mg/mL injection (4 mg Intravenous Given 12/06/23 1602)         PMHx:    Past Medical History:   Diagnosis Date    Arthropathy     Asthma     Carpal tunnel syndrome     Carpal tunnel syndrome     Congestive heart failure     COPD (chronic  obstructive pulmonary disease)     Diabetes mellitus, type 2     Fibromyalgia     Gastroparesis     Headache     HTN (hypertension)     Lumbar herniated disc     Neuropathy (CMS HCC)     Restless legs syndrome (RLS)     Thyroid  disease     TIA (transient ischemic attack)     TIA (transient ischemic attack)         PSHx:   Past Surgical History:   Procedure Laterality Date    HX CHOLECYSTECTOMY      HX HERNIA REPAIR      HX HERNIA REPAIR      HX HYSTERECTOMY      HX ROTATOR CUFF REPAIR      STOMACH SURGERY      X4          Allergies:    Allergies[1] Social History  Social History[2]    Family History  Family Medical History:    None            Home Meds:      Prior to Admission medications   Medication Sig Start Date End Date Taking?  Authorizing Provider   albuterol  sulfate (PROVENTIL  OR VENTOLIN  OR PROAIR ) 90 mcg/actuation Inhalation oral inhaler Take 1-2 Puffs by inhalation Every 6 hours as needed    Provider, Historical   amLODIPine  (NORVASC ) 10 mg Oral Tablet Take 1 Tablet (10 mg total) by mouth Once a day    Provider, Historical   aspirin  (ECOTRIN) 81 mg Oral Tablet, Delayed Release (E.C.) Take 1 Tablet (81 mg total) by mouth Once a day    Provider, Historical   atorvastatin  calcium (ATORVASTATIN  ORAL) Take 80 mg by mouth Once a day    Provider, Historical   azithromycin  (ZITHROMAX ) 500 mg Oral Tablet Take 1 Tablet (500 mg total) by mouth Every 24 hours 01/28/23   Bezzek, Mark S, MD   bethanechol chloride (URECHOLINE) 10 mg Oral Tablet Take 0.5 Tablets (5 mg total) by mouth Once a day    Provider, Historical   chlorzoxazone  (PARAFON  FORTE) 500 mg Oral Tablet Take 1 Tablet (500 mg total) by mouth Every 6 hours as needed for Muscle spasms 04/20/23   Rozanna Endow, MD   clobetasoL (TEMOVATE) 0.05 % Ointment APPLY TO THE AFFECTED AREA(S) TWICE DAILY FOR TWO WEEKS; STOP FOR two WEEKS THEN USE AS NEEDED FOR itch. do not USE ON face, groin, OR long term.    Provider, Historical   clopidogreL (PLAVIX) 75 mg Oral Tablet  Take 1 Tablet (75 mg total) by mouth Once a day    Provider, Historical   cyanocobalamin (VITAMIN B12) 1,000 mcg/mL Injection Solution inject 1ml SUBCUTANEOUSLY EVERY A MONTH    Provider, Historical   cyclobenzaprine  (FLEXERIL ) 10 mg Oral Tablet Take 1 Tablet (10 mg total) by mouth Twice daily    Provider, Historical   diphenhydrAMINE  (BANOPHEN ) 50 mg Oral Capsule TAKE 1 CAPSULE BY MOUTH EVERY 6 HOURS AS NEEDED FOR UP TO 7 DAYS (GENERIC FOR BENADRYL )    Provider, Historical   divalproex (DEPAKOTE ER) 250 mg Oral Tablet Sustained Release 24 hr Take 1 Tablet (250 mg total) by mouth  Patient not taking: Reported on 01/29/2023 05/08/22   Provider, Historical   docusate sodium (COLACE) 100 mg Oral Capsule 100 mg by oral route.    Provider, Historical   dulaglutide (TRULICITY) 0.75 mg/0.5 mL Subcutaneous Pen Injector Inject 0.5 mL (0.75 mg total) under the skin Every 7 days    Provider, Historical   DULoxetine  (CYMBALTA  DR) 30 mg Oral Capsule, Delayed Release(E.C.) Take 1 Capsule (30 mg total) by mouth Twice daily    Provider, Historical   ergocalciferol , vitamin D2, (DRISDOL) 1,250 mcg (50,000 unit) Oral Capsule Take 1 Capsule (50,000 Units total) by mouth Every 7 days    Provider, Historical   famotidine  (PEPCID ) 20 mg Oral Tablet Take 1 Tablet (20 mg total) by mouth Once a day 05/08/22   Provider, Historical   furosemide (LASIX) 20 mg Oral Tablet Take 1 Tablet (20 mg total) by mouth Once per day as needed    Provider, Historical   gabapentin (NEURONTIN) 400 mg Oral Capsule Take 1 Capsule (400 mg total) by mouth Three times a day    Provider, Historical   hydrOXYzine pamoate (VISTARIL) 25 mg Oral Capsule Take 2 Capsules (50 mg total) by mouth Once per day as needed    Provider, Historical   insulin  detemir U-100 (LEVEMIR FLEXPEN) 100 unit/mL (3 mL) Subcutaneous Insulin  Pen Inject 60 Units under the skin Once per day as needed (SLIDING SCALE)    Provider, Historical   insulin  lispro (HUMALOG  KWIKPEN INSULIN  SUBQ) Inject  under the skin 7-20 units tid before meals    Provider, Historical   ipratropium/albuterol  sulfate (COMBIVENT INHL) Take 1 Puff by inhalation Four times a day as needed    Provider, Historical   ketorolac  tromethamine  (TORADOL ) 10 mg Oral Tablet Take 1 Tablet (10 mg total) by mouth Every 6 hours as needed for Pain 12/22/21   Cindie Elesa RAMAN, FNP-BC   lisinopriL (PRINIVIL) 20 mg Oral Tablet Take 1 Tablet (20 mg total) by mouth Once a day    Provider, Historical   loratadine (CLARITIN) 10 mg Oral Tablet Take 1 Tablet (10 mg total) by mouth Once per day as needed 05/08/21   Provider, Historical   MetFORMIN (GLUCOPHAGE) 1,000 mg Oral Tablet Take 1 Tablet (1,000 mg total) by mouth Twice daily with food    Provider, Historical   metoclopramide  (REGLAN ) 100 mcg/mL Oral Solution Take 5 mL (500 mcg total) by mouth Three times daily before meals    Provider, Historical   metoprolol  succinate (TOPROL -XL) 50 mg Oral Tablet Sustained Release 24 hr Take 1 Tablet (50 mg total) by mouth Once a day    Provider, Historical   omeprazole (PRILOSEC) 40 mg Oral Capsule, Delayed Release(E.C.) Take 1 Capsule (40 mg total) by mouth Once a day    Provider, Historical   ondansetron  (ZOFRAN  ODT) 4 mg Oral Tablet, Rapid Dissolve Take 1 Tablet (4 mg total) by mouth Every 8 hours as needed for Nausea/Vomiting 04/27/23   Eilene Oneil RAMAN, MD   rOPINIRole  (REQUIP ) 1 mg Oral Tablet Take 1 Tablet (1 mg total) by mouth Three times a day    Provider, Historical   tiZANidine  (ZANAFLEX ) 4 mg Oral Tablet Take 1 Tablet (4 mg total) by mouth Three times a day  Patient taking differently: Take 1 Tablet (4 mg total) by mouth Three times a day as needed 12/22/21   Cindie Elesa RAMAN, FNP-BC   traMADoL  (ULTRAM ) 50 mg Oral Tablet Take 1 Tablet (50 mg total) by mouth Every 6 hours as needed for Pain 10/04/22   Eilene Oneil RAMAN, MD   traZODone  (DESYREL ) 100 mg Oral Tablet Take 1 Tablet (100 mg total) by mouth Every night    Provider, Historical   verapamiL (CALAN) 40 mg  Oral Tablet Take 1 Tablet (40 mg total) by mouth Once per day as needed (HEADACHE)    Provider, Historical          ROS:   General: No fever or chills. No weight changes, fatigue, weakness.   HEENT: No headaches, dizziness, changes in vision, changes in hearing, or difficulty swallowing.    Skin:  No rashes, erythema or bruises.   Cardiac: No chest pain, palpitations, or arrhythmia.    Respiratory: No shortness of breath, cough, or wheezing.  GI: No nausea or vomiting.  Positive for abdominal pain.   Urinary: No dysuria, hematuria, or change in frequency.    Vascular: No edema.     Musculoskeletal: No muscle weakness, pain, or decreased range of motion.   Neurologic: No loss of sensation, numbness or tingling.   Endocrine: No heat or cold intolerance or polydipsia.   Psychiatric: No insomnia, depression or anxiety.      Physical:  Filed Vitals:    12/07/23 0000 12/07/23 0015 12/07/23 0030 12/07/23 0045   BP:       Pulse: 96 99 96 100   Resp: (!) 24 19 18  (!) 23   Temp:       SpO2: 100% 91%  100%  General: Patient is alert and oriented to person, place, and time. No acute distress. Communicates appropriately.   Head: Normocephalic and atraumatic.    Eyes: Pupils equally round and react to light and accommodate. Extraocular movements intact.  Conjunctiva normal. Sclerae are normal.    Nose: Nasal passages clear. Mucosa moist.    Throat: Moist oral mucosa. No erythema or exudate of the pharynx. Clear oropharynx.    Neck: Supple. No cervical lymphadenopathy or supraclavicular nodes detected. Trachea midline   Heart: Regular rate and rhythm. S1 & S2 present. No S3 or S4. No rubs, gallops, or murmurs appreciated.  Radial and dorsalis pedis pulses +2/4 bilaterally.  Brisk capillary refill.    Lungs: Clear to auscultation bilaterally with no wheezes or rales. Equal chest excursion.  No conversational dyspnea. No respiratory distress noted.   Abdomen: Soft, nontender, nondistended belly. Bowel sounds are present in all  four quadrants. No rigidity.  No guarding.  No ascites.   Extremities: No edema, cyanosis, or clubbing. Grossly moves all extremities.    Skin: Warm and dry without lesions. No ecchymosis noted.  Right upper chest port.   Neurologic: Cranial nerves II through XII are grossly intact. Sensation to light touch is intact. Strength 5/5 in upper extremities and lower extremities bilaterally.    Genitourinary:  No urinary incontinence or Foley catheter   Psychiatric: Judgment and insight are intact. Mood and affect are appropriate for the situation.       Results for orders placed or performed during the hospital encounter of 12/06/23 (from the past 24 hours)   ECG 12 LEAD   Result Value Ref Range    Ventricular rate 92 BPM    Atrial Rate 92 BPM    PR Interval 162 ms    QRS Duration 82 ms    QT Interval 338 ms    QTC Calculation 417 ms    Calculated P Axis 26 degrees    Calculated R Axis -15 degrees    Calculated T Axis 10 degrees   COMPREHENSIVE METABOLIC PANEL, NON-FASTING   Result Value Ref Range    SODIUM 142 136 - 145 mmol/L    POTASSIUM 3.9 3.5 - 5.1 mmol/L    CHLORIDE 108 (H) 98 - 107 mmol/L    CO2 TOTAL 24 21 - 32 mmol/L    ANION GAP 10 4 - 13 mmol/L    BUN 12 7 - 18 mg/dL    CREATININE 9.13 9.44 - 1.02 mg/dL    BUN/CREA RATIO 14     ESTIMATED GFR 77 >59 mL/min/1.65m^2    ALBUMIN 2.7 (L) 3.4 - 5.0 g/dL    CALCIUM 9.0 8.5 - 89.8 mg/dL    GLUCOSE 75 74 - 893 mg/dL    ALKALINE PHOSPHATASE 96 46 - 116 U/L    ALT (SGPT) 18 <=78 U/L    AST (SGOT) 14 (L) 15 - 37 U/L    BILIRUBIN TOTAL 0.2 0.2 - 1.0 mg/dL    PROTEIN TOTAL 6.5 6.4 - 8.2 g/dL    ALBUMIN/GLOBULIN RATIO 0.7 (L) 0.8 - 1.4    OSMOLALITY, CALCULATED 282 270 - 290 mOsm/kg    CALCIUM, CORRECTED 10.0 mg/dL    GLOBULIN 3.8    PT/INR   Result Value Ref Range    PROTHROMBIN TIME 11.6 9.8 - 12.7 seconds    INR 1.03 0.84 - 1.10   PTT (PARTIAL THROMBOPLASTIN TIME)   Result Value Ref Range    APTT 34.9 25.0 - 38.0 seconds   TROPONIN-I  NOW   Result Value Ref Range     TROPONIN I 2 <15 ng/L   LIPASE   Result Value Ref Range    LIPASE 13 (L) 16 - 77 U/L   MAGNESIUM    Result Value Ref Range    MAGNESIUM  1.9 1.8 - 2.4 mg/dL   RETICULOCYTE COUNT   Result Value Ref Range    RETICULOCYTE % AUTOMATED 0.99 0.51 - 2.17 %    IMMATURE RETIC FRACTION 0.44 0.26 - 0.52    RETICULOCYTES COUNT # AUTOMATED 0.0337 0.0221 - 0.0963 10(6)/uL   CBC WITH DIFF   Result Value Ref Range    WBC 3.7 (L) 3.8 - 11.8 x10^3/uL    RBC 3.22 (L) 3.63 - 4.92 x10^6/uL    HGB 7.9 (L) 10.9 - 14.3 g/dL    HCT 75.3 (L) 68.7 - 41.9 %    MCV 76.5 75.5 - 95.3 fL    MCH 24.6 (L) 24.7 - 32.8 pg    MCHC 32.2 (L) 32.3 - 35.6 g/dL    RDW 81.7 (H) 87.6 - 17.7 %    PLATELETS 323 140 - 440 x10^3/uL    MPV 7.8 (L) 7.9 - 10.8 fL   MANUAL DIFFERENTIAL   Result Value Ref Range    WBC 3.7 x10^3/uL    NEUTROPHIL % 48 40 - 76 %    LYMPHOCYTE % 35 25 - 45 %    MONOCYTE % 10 0 - 12 %    EOSINOPHIL % 5 0 - 7 %    BASOPHIL % 2 0 - 3 %    METAMYELOCYTE %      MYELOCYTE %      PROMYELOCYTE %      BAND %      BLAST %      OTHER %      NEUTROPHIL ABSOLUTE 1.78 (L) 1.80 - 8.40 x10^3/uL    LYMPHOCYTE ABSOLUTE 1.30 1.10 - 5.00 x10^3/uL    MONOCYTE ABSOLUTE 0.37 (H) 0.00 - 0.30 x10^3/uL    EOSINOPHIL ABSOLUTE 0.19 0.00 - 0.80 x10^3/uL    BASOPHIL ABSOLUTE 0.07 0.00 - 0.30 x10^3/uL    METAMYELOCYTE ABSOLUTE      MYELOCYTE ABSOLUTE      PROMYELOCYTE ABSOLUTE      BLAST ABSOLUTE      OTHER CELL ABSOLUTE      ANISOCYTOSIS 2+/Moderate     HYPOCHROMASIA 2+/Moderate     POLYCHROMASIA      BASOPHILIC STIPPLING      MICROCYTOSIS      MACROCYTOSIS      ROULEAUX      SCHISTOCYTES Absent     SPHEROCYTES      TARGET CELLS      TEARDROP CELLS      OVALOCYTE (ELLIPTOCYTE)      CRENATED RED CELLS      STOMATOCYTES      ACANTHOCYTES (SPUR CELL)      ECHINOCYTE (BURR CELL)      BITE CELLS      BLISTER CELLS      SICKLE CELLS      PAPPENHEIMER BODIES      RBC AGGLUTINATES      HOWELL JOLLY BODIES      ATYPICAL LYMPHOCYTES      TOXIC GRANULATION      DOHLE BODIES       TOXIC VACUOLIZATION      AUER RODS      BASKET CELLS      HYPERSEGMENTATION  HYPOGRANULATION      HYPOSEGMENTED NEUTROPHIL      MICROORGANISMS      LARGE PLATELETS      PLATELET CLUMPS      PLATELET SATELLITOSIS      PLATELET MORPHOLOGY COMMENT Normal     BANDS NEUTROPHILS MANUAL      BAND ABSOLUTE      NEUTROPHILS MANUAL 48     LYMPHOCYTES MANUAL 35     MONOCYTES MANUAL 10     EOSINOPHILS MANUAL 5     BASOPHILS MANUAL 2     PROMYELOCYTES MANUAL      MYELOCYTES MANUAL      METAMYELOCYTES MANUAL      BLASTS MANUAL      TOTAL CELLS COUNTED [#] IN BLOOD 100     OTHER CELLS MANUAL      NUCLEATED RBC MANUAL      PLASMA CELL %      PLASMA CELL ABSOLUE      PLASMA CELLS MANUAL     TROPONIN-I IN ONE HOUR   Result Value Ref Range    TROPONIN I 2 <15 ng/L   TYPE AND SCREEN   Result Value Ref Range    UNITS ORDERED NOT STATED     ABO/RH(D) O POSITIVE     ANTIBODY SCREEN NEGATIVE     SPECIMEN EXPIRATION DATE 12/09/2023,2359    H & H   Result Value Ref Range    HGB 8.4 (L) 10.9 - 14.3 g/dL    HCT 73.9 (L) 68.7 - 41.9 %   URINALYSIS, MACRO/MICRO   Result Value Ref Range    COLOR Light Yellow Light Yellow, Yellow    APPEARANCE Clear Clear    SPECIFIC GRAVITY <=1.005 1.003 - 1.035    PH 6.0 4.6 - 8.0    LEUKOCYTES Negative Negative WBCs/uL    NITRITE Negative Negative    PROTEIN Negative Negative mg/dL    GLUCOSE Negative Negative mg/dL    KETONES Negative Negative mg/dL    BILIRUBIN Negative Negative mg/dL    BLOOD Negative Negative mg/dL    UROBILINOGEN 0.2 0.2 - 1.0 mg/dL   OCCULT BLOOD, STOOL (IFOB)    Specimen: Stool   Result Value Ref Range    IFOB Negative Negative          Diagnostic studies:  PATIENT NAME: Martha White, Martha White MED REC NO: [id number]   BIRTH DATE: [birth year] ORDER: [id number]   SEX: F ORDER FROM: [facility]   REQUESTING PHYS: Cindie Staples S SERVICE DATE: 12/06/2023 13:30    ATTENDING GENNARO: Cindie Staples S REPORT DATE: 12/06/2023 13:42   REASON:         Final  CT ABDOMEN PELVIS WO IV CONTRAST                           Study Result    Narrative & Impression   Martha White     RADIOLOGIST: Oneil LITTIE Raw, MD     CT ABDOMEN PELVIS WO IV CONTRAST performed on 12/06/2023 1:30 PM     CLINICAL HISTORY: GENERALIZED ABD PAIN  Patient reports Electric shock through whole left side ongoing x4 days, reports WBC, RBC, and HGB low on labs.     TECHNIQUE:  Abdomen and pelvis CT without intravenous contrast.     COMPARISON:  12/29/2022     FINDINGS:  Noncontrast technique limits evaluation of the abdominal and pelvic viscera.     Lung bases: Subsegmental atelectasis left  lower lobe     Liver:   Unremarkable.     Gallbladder:   Surgically absent.     Spleen:   Unremarkable.     Pancreas:   Unremarkable.     Adrenals:   Unremarkable.     Kidneys:   2 mm nonobstructing stone left kidney. 1 mm punctate nonobstructing stone right kidney. No hydronephrosis.     Bladder:  Unremarkable.     Uterus and Adnexa:  Not clearly visualized and could be atrophic or absent.     Bowel:   Postsurgical changes in the stomach. Moderate amount of fecal matter in the colon. There is some focal prominence of the colon wall in the transverse colon. Consider colonoscopy to exclude a colon lesion.     Appendix:  Unremarkable     Lymph nodes:  No suspicious lymph node enlargement.     Vasculature:   Mild diffuse atherosclerotic calcifications are noted.      Peritoneum / Retroperitoneum: No ascites.  No free air.     Bones:   Degenerative changes of the spine. Grade 1 spondylolisthesis L4-5.           IMPRESSION:  1.NO ACUTE FINDINGS  2.FOCAL PROMINENCE OF THE COLON WALL IN THE TRANSVERSE COLON. CONSIDER COLONOSCOPY TO EXCLUDE A COLON LESION.           Radiologist location ID: [id number]              EKG interpretation:       @PEVF @    Assessments/Plan   -Symptomatic anemia, hemoglobin 8.4   - Lower abdominal pain   Pain control  Continue to monitor H and H  Plan to transfuse if hemoglobin is 7 or less  GI consult  Check occult blood  Pantoprazole   IV  Consider Hematology consult if anemia is not GI related.      Management of chronic conditions :     -Type 2 diabetes mellitus  Tight glycemic control  Hold antidiabetic medication for now given episodes of hypoglycemia  Sliding scale coverage  Diabetic diet    - Essential hypertension  Blood pressure controlled at this  Resume home antihypertensive medication  Continue to monitor blood pressure    - CAD  Resume atorvastatin  and Plavix      -Fibromyalgia  IV Dilaudid  p.r.n. for pain control  Percocet p.r.n. for pain control    -Asthma  DuoNeb p.r.n. for shortness of breaths    Plan of care discussed with patient and nurse  Case discussed with the admitting physician    Consultant : GI       Code status: No Order  DVT/PE Prophylaxis:  SCD for given symptomatic anemia  GI prophylaxis :  Problem  Daily Orders (From admission, onward)      None           Anticoagulants (last 24 hours)       None           Diet: No diet orders on file    Disposition:  The patient is currently acutely ill requiring treatment on the medical floor for symptomatic anemia. Patient will be closely evaluated monitor and remove be adjusted accordingly.  Estimated length of stay more than 48 hr to obtain full medical treatment.      Bertrum Campion, APRN   [facility]   12/07/2023      This note was partially created using voice recognition software and is inherently subject to errors including  those of syntax and sound alike  substitutions which may escape proof reading. In such instances, original meaning may be extrapolated by contextual derivation.            [1]   Allergies  Allergen Reactions    Rubbing Alcohol  (Ethanol) [Ethyl Alcohol ] Shortness of Breath    Sulfa (Sulfonamides) Anaphylaxis    Ibuprofen     Influenza Virus Vaccines     Influenza Virus Vaccine, Specific Hives/ Urticaria    Penicillins Hives/ Urticaria    Pneumococcal Vaccine Hives/ Urticaria   [2]   Social History  Tobacco Use    Smoking status: Never     Smokeless tobacco: Never   Vaping Use    Vaping status: Never Used   Substance Use Topics    Alcohol  use: Never    Drug use: Never

## 2023-12-07 NOTE — Nurses Notes (Signed)
 Latest Reference Range & Units 12/07/23 05:26   GLUCOSE, POC 70 - 100 mg/dl 59 (L)   (L): Data is abnormally low  Notified Martha White night shift coverage via secure chat. No new orders received at this time. Patient provided with juice. Will recheck in 15 minutes.

## 2023-12-08 DIAGNOSIS — E611 Iron deficiency: Secondary | ICD-10-CM

## 2023-12-08 DIAGNOSIS — F332 Major depressive disorder, recurrent severe without psychotic features: Secondary | ICD-10-CM | POA: Insufficient documentation

## 2023-12-08 DIAGNOSIS — Z903 Acquired absence of stomach [part of]: Secondary | ICD-10-CM

## 2023-12-08 LAB — COMPREHENSIVE METABOLIC PANEL, NON-FASTING
ALBUMIN/GLOBULIN RATIO: 1.1 (ref 0.8–1.4)
ALBUMIN: 3.1 g/dL — ABNORMAL LOW (ref 3.5–5.7)
ALKALINE PHOSPHATASE: 75 U/L (ref 34–104)
ALT (SGPT): 19 U/L (ref 7–52)
ANION GAP: 5 mmol/L (ref 4–13)
AST (SGOT): 25 U/L (ref 13–39)
BILIRUBIN TOTAL: 0.5 mg/dL (ref 0.3–1.0)
BUN/CREA RATIO: 13 (ref 6–22)
BUN: 12 mg/dL (ref 7–25)
CALCIUM, CORRECTED: 9.3 mg/dL (ref 8.9–10.8)
CALCIUM: 8.6 mg/dL (ref 8.6–10.3)
CHLORIDE: 106 mmol/L (ref 98–107)
CO2 TOTAL: 26 mmol/L (ref 21–31)
CREATININE: 0.91 mg/dL (ref 0.60–1.30)
ESTIMATED GFR: 72 mL/min/1.73mˆ2 (ref >59)
GLOBULIN: 2.7 (ref 2.0–3.5)
GLUCOSE: 89 mg/dL (ref 74–109)
OSMOLALITY, CALCULATED: 273 mosm/kg (ref 270–290)
POTASSIUM: 3.7 mmol/L (ref 3.5–5.1)
PROTEIN TOTAL: 5.8 g/dL — ABNORMAL LOW (ref 6.4–8.9)
SODIUM: 137 mmol/L (ref 136–145)

## 2023-12-08 LAB — POC BLOOD GLUCOSE (RESULTS)
GLUCOSE, POC: 98 mg/dL (ref 70–100)
GLUCOSE, POC: 89 mg/dL (ref 70–100)
GLUCOSE, POC: 97 mg/dL (ref 70–100)
GLUCOSE, POC: 102 mg/dL — ABNORMAL HIGH (ref 70–100)
GLUCOSE, POC: 114 mg/dL — ABNORMAL HIGH (ref 70–100)

## 2023-12-08 LAB — CBC WITH DIFF
BASOPHIL #: 0 x10ˆ3/uL (ref 0.00–0.10)
BASOPHIL %: 1 % (ref 0–1)
EOSINOPHIL #: 0.1 x10ˆ3/uL (ref 0.00–0.50)
EOSINOPHIL %: 2 % (ref 1–7)
HCT: 23.6 % — ABNORMAL LOW (ref 31.2–41.9)
HGB: 7.7 g/dL — ABNORMAL LOW (ref 10.9–14.3)
LYMPHOCYTE #: 0.4 x10ˆ3/uL — ABNORMAL LOW (ref 1.10–3.10)
LYMPHOCYTE %: 11 % — ABNORMAL LOW (ref 16–46)
MCH: 23.7 pg — ABNORMAL LOW (ref 24.7–32.8)
MCHC: 32.7 g/dL (ref 32.3–35.6)
MCV: 72.5 fL — ABNORMAL LOW (ref 75.5–95.3)
MONOCYTE #: 0.4 x10ˆ3/uL (ref 0.20–0.90)
MONOCYTE %: 10 % (ref 4–11)
MPV: 7.5 fL — ABNORMAL LOW (ref 7.9–10.8)
NEUTROPHIL #: 3 x10ˆ3/uL (ref 1.90–8.20)
NEUTROPHIL %: 76 % (ref 43–77)
PLATELETS: 301 x10ˆ3/uL (ref 140–440)
RBC: 3.25 x10ˆ6/uL — ABNORMAL LOW (ref 3.63–4.92)
RDW: 16.2 % (ref 12.3–17.7)
WBC: 3.9 x10ˆ3/uL (ref 3.8–11.8)

## 2023-12-08 LAB — MAGNESIUM: MAGNESIUM: 1.7 mg/dL — ABNORMAL LOW (ref 1.9–2.7)

## 2023-12-08 LAB — PHOSPHORUS: PHOSPHORUS: 3.8 mg/dL (ref 3.7–7.2)

## 2023-12-08 MED ORDER — PEG 3350-ELECTROLYTES 236 GRAM-22.74 GRAM-6.74 GRAM-5.86 GRAM SOLUTION
4.0000 L | Freq: Once | ORAL | Status: AC
Start: 2023-12-08 — End: 2023-12-08
  Administered 2023-12-08: 4000 mL via ORAL
  Filled 2023-12-08: qty 4000

## 2023-12-08 MED ORDER — QUETIAPINE 25 MG TABLET
50.0000 mg | ORAL_TABLET | Freq: Every evening | ORAL | Status: DC
Start: 2023-12-08 — End: 2023-12-10
  Administered 2023-12-08 – 2023-12-09 (×2): 50 mg via ORAL
  Filled 2023-12-08 (×2): qty 2

## 2023-12-08 MED ORDER — SODIUM CHLORIDE 0.9 % (FLUSH) INJECTION SYRINGE
10.0000 mL | INJECTION | Freq: Three times a day (TID) | INTRAMUSCULAR | Status: DC
Start: 2023-12-08 — End: 2023-12-10
  Administered 2023-12-08 – 2023-12-09 (×3): 0 mL via INTRAVENOUS
  Administered 2023-12-09: 10 mL via INTRAVENOUS
  Administered 2023-12-09 – 2023-12-10 (×2): 0 mL via INTRAVENOUS
  Administered 2023-12-10: 10 mL via INTRAVENOUS

## 2023-12-08 MED ORDER — PROCHLORPERAZINE EDISYLATE 10 MG/2 ML (5 MG/ML) INJECTION SOLUTION
10.0000 mg | Freq: Three times a day (TID) | INTRAMUSCULAR | Status: DC | PRN
Start: 2023-12-08 — End: 2023-12-10
  Administered 2023-12-08 – 2023-12-10 (×8): 10 mg via INTRAVENOUS
  Filled 2023-12-08 (×8): qty 2

## 2023-12-08 MED ORDER — DULOXETINE 30 MG CAPSULE,DELAYED RELEASE
60.0000 mg | DELAYED_RELEASE_CAPSULE | Freq: Two times a day (BID) | ORAL | Status: DC
Start: 2023-12-08 — End: 2023-12-10
  Administered 2023-12-08 – 2023-12-10 (×4): 60 mg via ORAL
  Filled 2023-12-08 (×4): qty 2

## 2023-12-08 NOTE — Nurses Notes (Signed)
 Provider informed of patient complaining of nausea. New order for PRN compazine .

## 2023-12-08 NOTE — Progress Notes (Signed)
 [place] MEDICINE [facility]    HOSPITALIST PROGRESS NOTE    Martha White  Date of service: 12/08/2023  Date of Admission:  12/06/2023  Hospital Day:  LOS: 1 day     Interval History:   Patient was seen and evaluated at bedside   No acute overnight events   Receiving 2nd dose of wean off of today hemoglobin still low      Intake & Output:    Intake/Output Summary (Last 24 hours) at 12/08/2023 1009  Last data filed at 12/07/2023 1800  Gross per 24 hour   Intake 655.5 ml   Output --   Net 655.5 ml       Vital Signs:  Filed Vitals:    12/08/23 0353 12/08/23 0359 12/08/23 0735 12/08/23 0935   BP: 120/63  111/64    Pulse: (!) 108  96    Resp: 16  17 18    Temp: (!) 38.1 C (100.5 F) (!) 38.3 C (101 F) 37.6 C (99.7 F)    SpO2:   95%           acetaminophen  (TYLENOL ) tablet, 650 mg, Oral, Q4H PRN  amLODIPine  (NORVASC ) tablet, 10 mg, Oral, Daily  atorvastatin  (LIPITOR) tablet, 80 mg, Oral, Daily  cholecalciferol  (VITAMIN D3) 1000 unit (25 mcg) tablet, 1,000 Units, Oral, Daily  Correction/SSIP insulin  lispro 100 units/mL injection, 1-5 Units, Subcutaneous, 4x/day AC  dextrose  (GLUTOSE) 40% oral gel, 15 g, Oral, Q15 Min PRN  dextrose  (GLUTOSE) 40% oral gel, 15 g, Oral, Q15 Min PRN  dextrose  50% (0.5 g/mL) injection - syringe, 12.5 g, Intravenous, Q15 Min PRN  dextrose  50% (0.5 g/mL) injection - syringe, 25 g, Intravenous, Once  dextrose  50% (0.5 g/mL) injection - syringe, 12.5 g, Intravenous, Q15 Min PRN  DULoxetine  (CYMBALTA ) delayed release capsule, 30 mg, Oral, 2x/day  glucagon  injection 1 mg, 1 mg, IntraMUSCULAR, Once PRN  glucagon  injection 1 mg, 1 mg, IntraMUSCULAR, Once PRN  HYDROmorphone  (DILAUDID ) 2 mg/mL injection, 0.5 mg, Intravenous, Q4H PRN  iron  sucrose (VENOFER ) 300 mg in NS 250 mL IVPB, 300 mg, Intravenous, Daily  methocarbamol  (ROBAXIN ) tablet, 500 mg, Oral, 4x/day PRN  metoprolol  succinate (TOPROL -XL) 24 hr extended release tablet, 50 mg, Oral, Daily  NS flush syringe, 10 mL, Intravenous,  Q8H  ondansetron  (ZOFRAN ) 2 mg/mL injection, 4 mg, Intravenous, Q6H PRN  pantoprazole  (PROTONIX ) 4 mg/mL injection, 40 mg, Intravenous, Q12H  pregabalin  (LYRICA ) capsule, 50 mg, Oral, NIGHTLY  prochlorperazine  (COMPAZINE ) 5 mg/mL injection, 10 mg, Intravenous, Q8H PRN  rOPINIRole  (REQUIP ) tablet, 1 mg, Oral, 3x/day  traMADol  (ULTRAM ) tablet, 50 mg, Oral, Q6H PRN  traZODone  (DESYREL ) tablet, 100 mg, Oral, NIGHTLY           Assessment/ Plan:     # symptomatic anemia   # iron -deficiency  Currently on IV Venofer  day 2/3  Consulted Hematology  Pantoprazole  IV  Also consulted GI     # history of gastrectomy for gastroparesis?    # diabetes mellitus type 2  #hypertension   #coronary artery disease   # fibromyalgia   # asthma  Continue home meds    Disposition Planning:  Pending clinical course      IVF:  None  Diet:  Regular  Gastric Ulcer Prophylaxis:  Pantoprazole    Bowel Regimen:  None    DVT/PE Prophylaxis : none  Daily Orders (From admission, onward)       Start     Ordered    12/07/23 0345  PT IS INTERMEDIATE  RISK FOR VENOUS THROMBOEMBOLISM  (MEDIUM RISK VTE LEVEL)  CONTINUOUS        References:    VTE RISK ASSESSMENT TOOL    12/07/23 0330                   Lines/Drains:         On the day of the encounter, a total of 25 minutes was spent on this patient encounter including review of historical information, examination, documentation and post-visit activities. The time documented excludes procedural time and time spent on wellness portion of the visit.     Emary Zalar Jess Meals, MD  12/08/2023  [facility] MEDICINE HOSPITALIST

## 2023-12-08 NOTE — Consults (Signed)
 Psychiatric Consultation    Patient's Full Name: Martha White   Patient's Date of Birth: Mar 16, 1966   Patient's Age: 58 y.o.   Patient's Legal Sex: female   Patient's MRN: [id number]   Patient's Date of Admission: 12/06/2023   Current Date: 12/08/2023 12:55     Patient's Room/Bed: 320/A       Chief Complaint: depression       History of Present Illness: 58 year old female currently hospitalized at [facility] for anemia, abdominal pain. Psychiatric consult requested for depression. This morning she is alert and oriented x4. She says she gets depressed because she can't do things she wants to do due to medical problems. She rates depression 7/10. She is has been on Cymbalta  30 mg daily  for 1 year.  She says says she is not able to sleep for days. She is on Trazodone  for sleep. She is also on pain medication for Fibromyalgia. She says she has been struggling with depression and insomnia for years.       Psychiatric History:    Past Diagnoses: depression     Treatment History:   History of Inpatient Psychiatric Treatment? no  History of Outpatient Psychiatric Treatment?no      Substance Abuse History:    Alcohol  Use: no    Any history of blackouts, DTs, withdrawal symptoms?    Drug Use: no     Tobacco Use:  no     Substance Abuse Comments:       Medications and Allergies:    Allergies:  Allergies[1]   (I.e. ACE Inhibitors, NSAIDs, ASA, Fish/Seafood, Latex, Nuts, Penicillin, Sulfa)    Current Psychiatric Medications:   Cymbalta      Social History:    Living Situation: home     Marital Status:married     Highest Level of Education:      State of Employment: disabled due to accident     Family hx: none     Vital Signs:    Filed Vitals:    12/08/23 0735 12/08/23 0935 12/08/23 1041 12/08/23 1121   BP: 111/64   106/64   Pulse: 96  83 80   Resp: 17 18  17    Temp: 37.6 C (99.7 F)   37 C (98.6 F)   SpO2: 95%  94% 96%        Labs:    Results for orders placed or performed during the hospital encounter of  12/06/23 (from the past 24 hours)   VITAMIN B12    Collection Time: 12/07/23  2:42 PM   Result Value Ref Range    VITAMIN B 12 657 180 - 914 pg/mL   POC BLOOD GLUCOSE (RESULTS)    Collection Time: 12/07/23  5:07 PM   Result Value Ref Range    GLUCOSE, POC 112 (H) 70 - 100 mg/dl   POC BLOOD GLUCOSE (RESULTS)    Collection Time: 12/07/23  7:31 PM   Result Value Ref Range    GLUCOSE, POC 151 (H) 70 - 100 mg/dl   SEDIMENTATION RATE    Collection Time: 12/07/23  9:55 PM   Result Value Ref Range    ERYTHROCYTE SEDIMENTATION RATE (ESR) 33 (H) <30 mm/hr   POC BLOOD GLUCOSE (RESULTS)    Collection Time: 12/08/23  5:07 AM   Result Value Ref Range    GLUCOSE, POC 98 70 - 100 mg/dl   CBC/DIFF    Collection Time: 12/08/23  6:13 AM    Narrative    The  following orders were created for panel order CBC/DIFF.  Procedure                               Abnormality         Status                     ---------                               -----------         ------                     CBC WITH IPQQ[[id number]]                Abnormal            Final result                 Please view results for these tests on the individual orders.   COMPREHENSIVE METABOLIC PANEL, NON-FASTING    Collection Time: 12/08/23  6:13 AM   Result Value Ref Range    SODIUM 137 136 - 145 mmol/L    POTASSIUM 3.7 3.5 - 5.1 mmol/L    CHLORIDE 106 98 - 107 mmol/L    CO2 TOTAL 26 21 - 31 mmol/L    ANION GAP 5 4 - 13 mmol/L    BUN 12 7 - 25 mg/dL    CREATININE 9.08 9.39 - 1.30 mg/dL    BUN/CREA RATIO 13 6 - 22    ESTIMATED GFR 72 >59 mL/min/1.61m^2    ALBUMIN 3.1 (L) 3.5 - 5.7 g/dL    CALCIUM 8.6 8.6 - 89.6 mg/dL    GLUCOSE 89 74 - 890 mg/dL    ALKALINE PHOSPHATASE 75 34 - 104 U/L    ALT (SGPT) 19 7 - 52 U/L    AST (SGOT) 25 13 - 39 U/L    BILIRUBIN TOTAL 0.5 0.3 - 1.0 mg/dL    PROTEIN TOTAL 5.8 (L) 6.4 - 8.9 g/dL    ALBUMIN/GLOBULIN RATIO 1.1 0.8 - 1.4    OSMOLALITY, CALCULATED 273 270 - 290 mOsm/kg    CALCIUM, CORRECTED 9.3 8.9 - 10.8 mg/dL    GLOBULIN 2.7 2.0 - 3.5     Narrative    Estimated Glomerular Filtration Rate (eGFR) is calculated using the CKD-EPI (2021) equation, intended for patients 60 years of age and older. If gender is not documented or unknown, there will be no eGFR calculation.     MAGNESIUM     Collection Time: 12/08/23  6:13 AM   Result Value Ref Range    MAGNESIUM  1.7 (L) 1.9 - 2.7 mg/dL   PHOSPHORUS    Collection Time: 12/08/23  6:13 AM   Result Value Ref Range    PHOSPHORUS 3.8 3.7 - 7.2 mg/dL   CBC WITH DIFF    Collection Time: 12/08/23  6:13 AM   Result Value Ref Range    WBC 3.9 3.8 - 11.8 x10^3/uL    RBC 3.25 (L) 3.63 - 4.92 x10^6/uL    HGB 7.7 (L) 10.9 - 14.3 g/dL    HCT 76.3 (L) 68.7 - 41.9 %    MCV 72.5 (L) 75.5 - 95.3 fL    MCH 23.7 (L) 24.7 - 32.8 pg    MCHC 32.7 32.3 - 35.6 g/dL    RDW 83.7  12.3 - 17.7 %    PLATELETS 301 140 - 440 x10^3/uL    MPV 7.5 (L) 7.9 - 10.8 fL    NEUTROPHIL % 76 43 - 77 %    LYMPHOCYTE % 11 (L) 16 - 46 %    MONOCYTE % 10 4 - 11 %    EOSINOPHIL % 2 1 - 7 %    BASOPHIL % 1 0 - 1 %    NEUTROPHIL # 3.00 1.90 - 8.20 x10^3/uL    LYMPHOCYTE # 0.40 (L) 1.10 - 3.10 x10^3/uL    MONOCYTE # 0.40 0.20 - 0.90 x10^3/uL    EOSINOPHIL # 0.10 0.00 - 0.50 x10^3/uL    BASOPHIL # 0.00 0.00 - 0.10 x10^3/uL   POC BLOOD GLUCOSE (RESULTS)    Collection Time: 12/08/23  7:18 AM   Result Value Ref Range    GLUCOSE, POC 89 70 - 100 mg/dl   POC BLOOD GLUCOSE (RESULTS)    Collection Time: 12/08/23 11:01 AM   Result Value Ref Range    GLUCOSE, POC 97 70 - 100 mg/dl        Physical Exam:     Please see dictated hospitalist note    Current Facility-Administered Medications   Medication Dose Route Frequency    acetaminophen  (TYLENOL ) tablet  650 mg Oral Q4H PRN    amLODIPine  (NORVASC ) tablet  10 mg Oral Daily    atorvastatin  (LIPITOR) tablet  80 mg Oral Daily    cholecalciferol  (VITAMIN D3) 1000 unit (25 mcg) tablet  1,000 Units Oral Daily    Correction/SSIP insulin  lispro 100 units/mL injection  1-5 Units Subcutaneous 4x/day AC    dextrose  (GLUTOSE) 40%  oral gel  15 g Oral Q15 Min PRN    dextrose  (GLUTOSE) 40% oral gel  15 g Oral Q15 Min PRN    dextrose  50% (0.5 g/mL) injection - syringe  12.5 g Intravenous Q15 Min PRN    dextrose  50% (0.5 g/mL) injection - syringe  25 g Intravenous Once    dextrose  50% (0.5 g/mL) injection - syringe  12.5 g Intravenous Q15 Min PRN    DULoxetine  (CYMBALTA ) delayed release capsule  60 mg Oral 2x/day    glucagon  injection 1 mg  1 mg IntraMUSCULAR Once PRN    glucagon  injection 1 mg  1 mg IntraMUSCULAR Once PRN    HYDROmorphone  (DILAUDID ) 2 mg/mL injection  0.5 mg Intravenous Q4H PRN    iron  sucrose (VENOFER ) 300 mg in NS 250 mL IVPB  300 mg Intravenous Daily    methocarbamol  (ROBAXIN ) tablet  500 mg Oral 4x/day PRN    metoprolol  succinate (TOPROL -XL) 24 hr extended release tablet  50 mg Oral Daily    NS flush syringe  10 mL Intravenous Q8H    ondansetron  (ZOFRAN ) 2 mg/mL injection  4 mg Intravenous Q6H PRN    pantoprazole  (PROTONIX ) 4 mg/mL injection  40 mg Intravenous Q12H    polyethylene glycol electrolyte (goLYTELY ) oral liquid  4 L Oral Once    pregabalin  (LYRICA ) capsule  50 mg Oral NIGHTLY    prochlorperazine  (COMPAZINE ) 5 mg/mL injection  10 mg Intravenous Q8H PRN    QUEtiapine  (SEROquel ) tablet  50 mg Oral NIGHTLY    rOPINIRole  (REQUIP ) tablet  1 mg Oral 3x/day    traMADol  (ULTRAM ) tablet  50 mg Oral Q6H PRN    traZODone  (DESYREL ) tablet  100 mg Oral NIGHTLY        Mental Status Examination:  .Sensorium/Alertness: Alert,   Orientation: Date, Person,  Place, Situation  Appearance:Appears stated age   Psychomotor Activity: Normal,   Abnormal Behaviors: None,   Attitude Towards Examiner: Cooperative,  Eye Contact: Normal,   Speech: Normal/Spontaneous,    Mood:  Depressed,   Affect: calm, pleasant   Perception: WNL  Though Process: Logical/Clear/Goal Oriented,  Thought Content: Suicidal? Denies   Thought Content: Homicidal? no  Thought Content: Delusions? no  Impulse Control:  Grossly Intact,   Concentration/Calculation/Attention  Span: WNL,  How was the patient's Concentration/Calculation/Attention tested/assessed? Per observation and interview with patient   Recent Memory: WNL,  Remote Memory: WNL,   How was the patient's Remote Memory Tested/Assessed? Past Events, as it relates to history  Intelligence/Fund of Knowledge: Average  How was the patient's Intelligence/Fund of Knowledge Tested/Assessed? Based on history, Based on vocabulary, syntax, grammar, and content  Judgement: Fair,   How was the patient's Judgement Tested/Assessed? Per patient's behavior/history of present illness  Insight: Fair,   How was the patient's Insight Tested/Assessed? Understanding of severity of illness/history of present illness       Discussed with family/guardian/significant other:no      Clinical Conclusion:    Overall Impression:58 year old female currently hospitalized at [facility] for anemia, abdominal pain. Patient has depression.         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  Hgb 7.7 CMP: WNL    If not already established with an outpatient psychiatric provider, please make outpatient appointment in their area of residence prior to discharge, if indicated.    Start Seroquel  50 mg at hs for depression and sleep    Continue with Trazodone  100 mg at hs for sleep; home med    Increase Cymbalta  from 30 mg to 60 mg daily for depression.               Interaction Attestation: Clinical telemedicine services delivered using HIPAA-compliant interactive video-audio telecommunications while the patient and the rendering provider were not in the same physical location. Patient agreeable to telecommunication    TELEMEDICINE DOCUMENTATION:    Patient Location:   [facility], [address], [place], [place] [address]   Provider Location: Remote  Patient/family aware of provider location:  yes  Patient/family consent for telemedicine:  yes  Examination observed and performed by:  .Ezella Sanders, PMHNP    ATTENDING ATTESTATION:  The NP evaluated the patient and provided care. I have reviewed the note. I agree with their findings and at this time have no amendments to the current plan.     Prentice ROCKFORD Quin, DO  Psychiatrist  Medical Director  [facility], [facility]       [1]   Allergies  Allergen Reactions    Rubbing Alcohol  (Ethanol) [Ethyl Alcohol ] Shortness of Breath    Sulfa (Sulfonamides) Anaphylaxis    Ibuprofen     Influenza Virus Vaccines     Influenza Virus Vaccine, Specific Hives/ Urticaria    Penicillins Hives/ Urticaria    Pneumococcal Vaccine Hives/ Urticaria

## 2023-12-08 NOTE — Consults (Signed)
 [place] [facility]  Gastroenterology/ Hepatology Consult Note    Date of Service:  12/08/2023  Martha White   58 y.o. female  Date of Admission:  12/06/2023  Date of Birth:  1965/12/06  1     Reason for Consultation:      anemia    History of Present Illness:  Martha White is a 58 y.o. Black or African American female who presented to [facility] ER with electric shock feeling left arm, abdominal pain and weakness. Just recently established care with Dr Malachy a month ago. She has history of gastroparesis and had 95% of her stomach removed by Dr Sidney in [place].    CT abdomen was negative but showed focal prominence of the colon in transverse colon and colonoscopy should be considered to exclude lesion.   Hgb noted to be 8.4 in ER.     Today she has some mid abdominal discomfort. She frequently has N/V especially if she eats too much. She denies heartburn. Denies diarrhea, constipation, blood in stools or melena.  We have seen her in the past but not for several years.           History:    Past Medical:    Past Medical History:   Diagnosis Date    Arthropathy     Asthma     Carpal tunnel syndrome     Carpal tunnel syndrome     Congestive heart failure     COPD (chronic obstructive pulmonary disease)     Diabetes mellitus, type 2     Fibromyalgia     Gastroparesis     Headache     HTN (hypertension)     Lumbar herniated disc     Neuropathy (CMS HCC)     Restless legs syndrome (RLS)     Thyroid  disease     TIA (transient ischemic attack)     TIA (transient ischemic attack)       Past Surgical:    Past Surgical History:   Procedure Laterality Date    HX CHOLECYSTECTOMY      HX HERNIA REPAIR      HX HERNIA REPAIR      HX HYSTERECTOMY      HX ROTATOR CUFF REPAIR      STOMACH SURGERY      X4      Family:    Family Medical History:    None        Social:   reports that she has never smoked. She has never used smokeless tobacco. She reports that she does not drink alcohol  and does not use drugs.      REVIEW OF SYSTEMS:  Review of Systems   Constitutional: Negative for fever.      As per HPI    Allergies[1]    Medications:  Medications Prior to Admission       Prescriptions    albuterol  sulfate (PROVENTIL  OR VENTOLIN  OR PROAIR ) 90 mcg/actuation Inhalation oral inhaler    Take 1-2 Puffs by inhalation Every 6 hours as needed    amLODIPine  (NORVASC ) 10 mg Oral Tablet    Take 1 Tablet (10 mg total) by mouth Once a day    aspirin  (ECOTRIN) 81 mg Oral Tablet, Delayed Release (E.C.)    Take 1 Tablet (81 mg total) by mouth Once a day    atorvastatin  calcium (ATORVASTATIN  ORAL)    Take 80 mg by mouth Once a day    azithromycin  (ZITHROMAX ) 500 mg Oral  Tablet    Take 1 Tablet (500 mg total) by mouth Every 24 hours    Patient not taking:  Reported on 12/07/2023    bethanechol chloride (URECHOLINE) 10 mg Oral Tablet    Take 0.5 Tablets (5 mg total) by mouth Once a day    chlorzoxazone  (PARAFON  FORTE) 500 mg Oral Tablet    Take 1 Tablet (500 mg total) by mouth Every 6 hours as needed for Muscle spasms    Patient not taking:  Reported on 12/07/2023    clobetasoL (TEMOVATE) 0.05 % Ointment    APPLY TO THE AFFECTED AREA(S) TWICE DAILY FOR TWO WEEKS; STOP FOR two WEEKS THEN USE AS NEEDED FOR itch. do not USE ON face, groin, OR long term.    Patient not taking:  Reported on 12/07/2023    clopidogreL (PLAVIX) 75 mg Oral Tablet    Take 1 Tablet (75 mg total) by mouth Once a day    cyanocobalamin (VITAMIN B12) 1,000 mcg/mL Injection Solution    Inject 1 mL (1,000 mcg total) under the skin Every 14 days    cyclobenzaprine  (FLEXERIL ) 10 mg Oral Tablet    Take 0.5 Tablets (5 mg total) by mouth Three times a day    diphenhydrAMINE  (BANOPHEN ) 50 mg Oral Capsule    TAKE 1 CAPSULE BY MOUTH EVERY 6 HOURS AS NEEDED FOR UP TO 7 DAYS (GENERIC FOR BENADRYL )    Patient not taking:  Reported on 12/07/2023    divalproex (DEPAKOTE ER) 250 mg Oral Tablet Sustained Release 24 hr    Take 1 Tablet (250 mg total) by mouth    Patient not taking:  Reported  on 01/29/2023    docusate sodium (COLACE) 100 mg Oral Capsule    100 mg by oral route.    dulaglutide (TRULICITY) 0.75 mg/0.5 mL Subcutaneous Pen Injector    Inject 0.5 mL (0.75 mg total) under the skin Every 7 days    DULoxetine  (CYMBALTA  DR) 30 mg Oral Capsule, Delayed Release(E.C.)    Take 1 Capsule (30 mg total) by mouth Twice daily    ergocalciferol , vitamin D2, (DRISDOL) 1,250 mcg (50,000 unit) Oral Capsule    Take 1 Capsule (50,000 Units total) by mouth Every 7 days    famotidine  (PEPCID ) 20 mg Oral Tablet    Take 1 Tablet (20 mg total) by mouth Once a day    furosemide (LASIX) 20 mg Oral Tablet    Take 1 Tablet (20 mg total) by mouth Once per day as needed    Patient not taking:  Reported on 12/07/2023    gabapentin (NEURONTIN) 400 mg Oral Capsule    Take 1 Capsule (400 mg total) by mouth Three times a day    hydrOXYzine pamoate (VISTARIL) 25 mg Oral Capsule    Take 1 Capsule (25 mg total) by mouth Every night    insulin  detemir U-100 (LEVEMIR FLEXPEN) 100 unit/mL (3 mL) Subcutaneous Insulin  Pen    Inject 60 Units under the skin Once per day as needed (SLIDING SCALE)    insulin  lispro (HUMALOG  KWIKPEN INSULIN  SUBQ)    Inject under the skin 7-20 units tid before meals    ipratropium/albuterol  sulfate (COMBIVENT INHL)    Take 1 Puff by inhalation Four times a day as needed    ketorolac  tromethamine  (TORADOL ) 10 mg Oral Tablet    Take 1 Tablet (10 mg total) by mouth Every 6 hours as needed for Pain    Patient not taking:  Reported on 12/07/2023  lisinopriL (PRINIVIL) 20 mg Oral Tablet    Take 1 Tablet (20 mg total) by mouth Once a day    loratadine (CLARITIN) 10 mg Oral Tablet    Take 1 Tablet (10 mg total) by mouth Once per day as needed    magnesium  chloride (SLOW-MAG) 64 mg Oral Tablet, Delayed Release (E.C.)    Take 1 Tablet (64 mg total) by mouth Daily Slow mag 71.5 mg-119 mg days 2 tablets daily    MetFORMIN (GLUCOPHAGE) 1,000 mg Oral Tablet    Take 1 Tablet (1,000 mg total) by mouth Every morning with  breakfast    metoclopramide  (REGLAN ) 100 mcg/mL Oral Solution    Take 5 mL (500 mcg total) by mouth Four times a day - before meals and bedtime    metoprolol  succinate (TOPROL -XL) 50 mg Oral Tablet Sustained Release 24 hr    Take 1 Tablet (50 mg total) by mouth Once a day    omeprazole (PRILOSEC) 40 mg Oral Capsule, Delayed Release(E.C.)    Take 1 Capsule (40 mg total) by mouth Once a day    ondansetron  (ZOFRAN  ODT) 4 mg Oral Tablet, Rapid Dissolve    Take 1 Tablet (4 mg total) by mouth Every 8 hours as needed for Nausea/Vomiting    Patient not taking:  Reported on 12/07/2023    promethazine  (PHENERGAN ) 25 mg Oral Tablet    Take 1 Tablet (25 mg total) by mouth Every 4 hours as needed for Nausea/Vomiting    rifAXIMin (XIFAXAN) 550 mg Oral Tablet    Take 1 Tablet (550 mg total) by mouth Three times a day    rOPINIRole  (REQUIP ) 1 mg Oral Tablet    Take 1 Tablet (1 mg total) by mouth Three times a day    tiZANidine  (ZANAFLEX ) 4 mg Oral Tablet    Take 1 Tablet (4 mg total) by mouth Three times a day    Patient not taking:  Reported on 12/07/2023    traMADoL  (ULTRAM ) 50 mg Oral Tablet    Take 1 Tablet (50 mg total) by mouth Every 6 hours as needed for Pain    Patient taking differently:  Take 2 Tablets (100 mg total) by mouth Every 8 hours as needed for Pain    traZODone  (DESYREL ) 100 mg Oral Tablet    Take 1 Tablet (100 mg total) by mouth Every night    verapamiL (CALAN) 40 mg Oral Tablet    Take 1 Tablet (40 mg total) by mouth Once per day as needed (HEADACHE)    Patient not taking:  Reported on 12/07/2023          acetaminophen  (TYLENOL ) tablet, 650 mg, Oral, Q4H PRN  amLODIPine  (NORVASC ) tablet, 10 mg, Oral, Daily  atorvastatin  (LIPITOR) tablet, 80 mg, Oral, Daily  cholecalciferol  (VITAMIN D3) 1000 unit (25 mcg) tablet, 1,000 Units, Oral, Daily  Correction/SSIP insulin  lispro 100 units/mL injection, 1-5 Units, Subcutaneous, 4x/day AC  dextrose  (GLUTOSE) 40% oral gel, 15 g, Oral, Q15 Min PRN  dextrose  (GLUTOSE) 40% oral  gel, 15 g, Oral, Q15 Min PRN  dextrose  50% (0.5 g/mL) injection - syringe, 12.5 g, Intravenous, Q15 Min PRN  dextrose  50% (0.5 g/mL) injection - syringe, 25 g, Intravenous, Once  dextrose  50% (0.5 g/mL) injection - syringe, 12.5 g, Intravenous, Q15 Min PRN  DULoxetine  (CYMBALTA ) delayed release capsule, 30 mg, Oral, 2x/day  glucagon  injection 1 mg, 1 mg, IntraMUSCULAR, Once PRN  glucagon  injection 1 mg, 1 mg, IntraMUSCULAR, Once PRN  HYDROmorphone  (DILAUDID ) 2 mg/mL  injection, 0.5 mg, Intravenous, Q4H PRN  iron  sucrose (VENOFER ) 300 mg in NS 250 mL IVPB, 300 mg, Intravenous, Daily  methocarbamol  (ROBAXIN ) tablet, 500 mg, Oral, 4x/day PRN  metoprolol  succinate (TOPROL -XL) 24 hr extended release tablet, 50 mg, Oral, Daily  NS flush syringe, 10 mL, Intravenous, Q8H  ondansetron  (ZOFRAN ) 2 mg/mL injection, 4 mg, Intravenous, Q6H PRN  pantoprazole  (PROTONIX ) 4 mg/mL injection, 40 mg, Intravenous, Q12H  pregabalin  (LYRICA ) capsule, 50 mg, Oral, NIGHTLY  prochlorperazine  (COMPAZINE ) 5 mg/mL injection, 10 mg, Intravenous, Q8H PRN  rOPINIRole  (REQUIP ) tablet, 1 mg, Oral, 3x/day  traMADol  (ULTRAM ) tablet, 50 mg, Oral, Q6H PRN  traZODone  (DESYREL ) tablet, 100 mg, Oral, NIGHTLY        Physical Exam:    Vitals:  Temperature: 37.6 C (99.7 F)  Heart Rate: 96  Respiratory Rate: 18  BP (Non-Invasive): 111/64  SpO2: 95 %    Exam:  Nursing note and vitals reviewed.   Objective:  General Appearance:  Comfortable, well-appearing and in no acute distress.    Vital signs: (most recent): Blood pressure 106/64, pulse 80, temperature 37 C (98.6 F), resp. rate 17, height 1.511 m (4' 11.5), weight 75 kg (165 lb 5.5 oz), SpO2 96%.  Vital signs are normal.  No fever.    Lungs:  Normal effort and normal respiratory rate.  Breath sounds clear to auscultation.    Heart: Normal rate.  Regular rhythm.  S1 normal and S2 normal.    Abdomen: Abdomen is soft.  Bowel sounds are normal.   There is epigastric tenderness.    Extremities: Normal range of  motion.    Pulses: Distal pulses are intact.    Neurological: Patient is alert and oriented to person, place and time.    Skin:  Warm and dry.        Labs:     Results for orders placed or performed during the hospital encounter of 12/06/23 (from the past 24 hours)   VITAMIN B12   Result Value Ref Range    VITAMIN B 12 657 180 - 914 pg/mL   POC BLOOD GLUCOSE (RESULTS)   Result Value Ref Range    GLUCOSE, POC 112 (H) 70 - 100 mg/dl   POC BLOOD GLUCOSE (RESULTS)   Result Value Ref Range    GLUCOSE, POC 151 (H) 70 - 100 mg/dl   SEDIMENTATION RATE   Result Value Ref Range    ERYTHROCYTE SEDIMENTATION RATE (ESR) 33 (H) <30 mm/hr   POC BLOOD GLUCOSE (RESULTS)   Result Value Ref Range    GLUCOSE, POC 98 70 - 100 mg/dl   COMPREHENSIVE METABOLIC PANEL, NON-FASTING   Result Value Ref Range    SODIUM 137 136 - 145 mmol/L    POTASSIUM 3.7 3.5 - 5.1 mmol/L    CHLORIDE 106 98 - 107 mmol/L    CO2 TOTAL 26 21 - 31 mmol/L    ANION GAP 5 4 - 13 mmol/L    BUN 12 7 - 25 mg/dL    CREATININE 9.08 9.39 - 1.30 mg/dL    BUN/CREA RATIO 13 6 - 22    ESTIMATED GFR 72 >59 mL/min/1.8m^2    ALBUMIN 3.1 (L) 3.5 - 5.7 g/dL    CALCIUM 8.6 8.6 - 89.6 mg/dL    GLUCOSE 89 74 - 890 mg/dL    ALKALINE PHOSPHATASE 75 34 - 104 U/L    ALT (SGPT) 19 7 - 52 U/L    AST (SGOT) 25 13 - 39 U/L  BILIRUBIN TOTAL 0.5 0.3 - 1.0 mg/dL    PROTEIN TOTAL 5.8 (L) 6.4 - 8.9 g/dL    ALBUMIN/GLOBULIN RATIO 1.1 0.8 - 1.4    OSMOLALITY, CALCULATED 273 270 - 290 mOsm/kg    CALCIUM, CORRECTED 9.3 8.9 - 10.8 mg/dL    GLOBULIN 2.7 2.0 - 3.5   MAGNESIUM    Result Value Ref Range    MAGNESIUM  1.7 (L) 1.9 - 2.7 mg/dL   PHOSPHORUS   Result Value Ref Range    PHOSPHORUS 3.8 3.7 - 7.2 mg/dL   CBC WITH DIFF   Result Value Ref Range    WBC 3.9 3.8 - 11.8 x10^3/uL    RBC 3.25 (L) 3.63 - 4.92 x10^6/uL    HGB 7.7 (L) 10.9 - 14.3 g/dL    HCT 76.3 (L) 68.7 - 41.9 %    MCV 72.5 (L) 75.5 - 95.3 fL    MCH 23.7 (L) 24.7 - 32.8 pg    MCHC 32.7 32.3 - 35.6 g/dL    RDW 83.7 87.6 - 82.2 %     PLATELETS 301 140 - 440 x10^3/uL    MPV 7.5 (L) 7.9 - 10.8 fL    NEUTROPHIL % 76 43 - 77 %    LYMPHOCYTE % 11 (L) 16 - 46 %    MONOCYTE % 10 4 - 11 %    EOSINOPHIL % 2 1 - 7 %    BASOPHIL % 1 0 - 1 %    NEUTROPHIL # 3.00 1.90 - 8.20 x10^3/uL    LYMPHOCYTE # 0.40 (L) 1.10 - 3.10 x10^3/uL    MONOCYTE # 0.40 0.20 - 0.90 x10^3/uL    EOSINOPHIL # 0.10 0.00 - 0.50 x10^3/uL    BASOPHIL # 0.00 0.00 - 0.10 x10^3/uL   POC BLOOD GLUCOSE (RESULTS)   Result Value Ref Range    GLUCOSE, POC 89 70 - 100 mg/dl   POC BLOOD GLUCOSE (RESULTS)   Result Value Ref Range    GLUCOSE, POC 97 70 - 100 mg/dl       Imaging Studies:    Results for orders placed or performed during the hospital encounter of 12/06/23   CT ABDOMEN PELVIS WO IV CONTRAST     Status: None    Narrative    Haydee Ovens    RADIOLOGIST: Oneil LITTIE Raw, MD    CT ABDOMEN PELVIS WO IV CONTRAST performed on 12/06/2023 1:30 PM    CLINICAL HISTORY: GENERALIZED ABD PAIN  Patient reports Electric shock through whole left side ongoing x4 days, reports WBC, RBC, and HGB low on labs.    TECHNIQUE:  Abdomen and pelvis CT without intravenous contrast.    COMPARISON:  12/29/2022    FINDINGS:  Noncontrast technique limits evaluation of the abdominal and pelvic viscera.    Lung bases: Subsegmental atelectasis left lower lobe    Liver:   Unremarkable.    Gallbladder:   Surgically absent.    Spleen:   Unremarkable.    Pancreas:   Unremarkable.    Adrenals:   Unremarkable.    Kidneys:   2 mm nonobstructing stone left kidney. 1 mm punctate nonobstructing stone right kidney. No hydronephrosis.    Bladder:  Unremarkable.    Uterus and Adnexa:  Not clearly visualized and could be atrophic or absent.    Bowel:   Postsurgical changes in the stomach. Moderate amount of fecal matter in the colon. There is some focal prominence of the colon wall in the transverse colon. Consider colonoscopy to exclude  a colon lesion.    Appendix:  Unremarkable    Lymph nodes:  No suspicious lymph node  enlargement.    Vasculature:   Mild diffuse atherosclerotic calcifications are noted.     Peritoneum / Retroperitoneum: No ascites.  No free air.    Bones:   Degenerative changes of the spine. Grade 1 spondylolisthesis L4-5.          Impression    1. NO ACUTE FINDINGS  2. FOCAL PROMINENCE OF THE COLON WALL IN THE TRANSVERSE COLON. CONSIDER COLONOSCOPY TO EXCLUDE A COLON LESION.        Radiologist location ID: [id number]     XR AP MOBILE CHEST     Status: None    Narrative    Kaysie Larmer    RADIOLOGIST: Franchot Dale Scales, MD    XR AP MOBILE CHEST performed on 12/06/2023 1:39 PM    CLINICAL HISTORY: Chest Pain  CHEST PAIN TINGLING ON LT SIDE OF CHEST    TECHNIQUE: Frontal view of the chest.    COMPARISON:  04/20/2023    FINDINGS:  A right MediPort is noted.   The heart size is normal.   There is a small focus of linear scarring or atelectasis peripherally at the mid left lung.  The lungs are otherwise clear.   No pneumothorax.  Postsurgical changes are noted of both shoulders.        Impression    MINIMAL SCARRING OR ATELECTASIS AT THE MID LEFT LUNG.            Radiologist location ID: [id number]     CT BRAIN WO IV CONTRAST     Status: None    Narrative    Savita Hendriks    RADIOLOGIST: Oneil LITTIE Raw, MD    CT BRAIN WO IV CONTRAST performed on 12/06/2023 3:25 PM    CLINICAL HISTORY: headache weakness. no deficits.  headache weakness. no deficits.    TECHNIQUE:  Head CT without intravenous contrast.    COMPARISON: 06/08/2023    FINDINGS:  There is no acute intracranial hemorrhage, mass effect, or evidence of large acute infarct.    Brain: Normal    CSF Spaces: Normal     Sinuses/Mastoids:  Clear at visualized levels     Bones: Unremarkable        Impression    NO ACUTE FINDINGS      Radiologist location ID: [id number]     CT CERVICAL SPINE WO IV CONTRAST     Status: None    Narrative    Hallel Ismael    RADIOLOGIST: Nicholas A Naro    CT CERVICAL SPINE WO IV CONTRAST performed on 12/07/2023 7:48 PM    CLINICAL  HISTORY: Severe neuropathy pain  Severe neuropathy pain, recent shoulder surgery    TECHNIQUE:  Cervical spine CT without contrast.      COMPARISON: 12/22/2021    FINDINGS:  Alignment: There is slight straightening of the normal cervical lordosis which is likely positional.    Vertebrae: No acute fracture or suspicious osseous abnormalities are identified. There are mild scattered degenerative changes without high-grade osseous canal stenosis. There is no significant osseous foraminal encroachment identified.    Soft Tissues:   No acute soft tissue abnormalities are identified in the neck. There is a partially visualized right subclavian central venous catheter. A left thyroid  nodule is better evaluated on prior fine-needle aspiration performed and 2024.        Impression    No acute osseous abnormalities in  the cervical spine.      Radiologist location ID: [id number]          Assessment/Plan:     Anemia  Abdominal pain  Abnormal radiology - thickening transverse colon/     Agree with current management and supportive care. Continue to monitor H&H and for signs GI bleeding. Transfuse as needed. Stool for OB pending. Will plan for EGD and colonoscopy tomorrow. Risks of procedure discussed with patient including bleeding, infection, perforation, allergic reaction. Clear liquids then NPO. Golytely  prep ordered.   Will continue to follow. Further recommendations based on clinical course. Patient has been discussed in detail with Dr MARLA Blanch and treatment plan decided by him. Thank you for this consultation.   Suzen Caldron Reine Bristow, CFNP           [1]   Allergies  Allergen Reactions    Rubbing Alcohol  (Ethanol) [Ethyl Alcohol ] Shortness of Breath    Sulfa (Sulfonamides) Anaphylaxis    Ibuprofen     Influenza Virus Vaccines     Influenza Virus Vaccine, Specific Hives/ Urticaria    Penicillins Hives/ Urticaria    Pneumococcal Vaccine Hives/ Urticaria

## 2023-12-08 NOTE — Care Plan (Signed)
 Medical Nutrition Therapy Assessment    Reason for assessment: weight loss    OBJECTIVE:   PMH includes:  CHF, COPD, TIA, DM, HTN, depression, fibromyalgia, gastroparesis, thyroid  disease  Current Diet Order/Nutrition Support:  DIET REGULAR Do you want to initiate MNT Protocol? Yes    Height Used for Calculations: 151.1 cm (4' 11.49)  Weight Used For Calculations: 75 kg (165 lb 5.5 oz)  BMI (kg/m2): 32.92  Ideal Body Weight (IBW) (kg): 44.69  % Ideal Body Weight: 167.82    Estimated Needs:  Energy Calorie Requirements: 1364 kcal/day (30 kcal/kg IBW)  Protein Requirements (gms/day): 45 gm/day (1.0 gm/kg IBW)    Comments: Admission problems include abdominal pain, symptomatic anemia, iron  deficiency.  Hgb 7.7, Na 137, Gluc 89, GFR 72, Hgb A1C 5.6 last month  Meal intakes 50%    Plan/Interventions : Patient complaint of nausea this morning.  Add GI soft to diet order.  Follow up tomorrow.  Monitor diet tolerance and weight.    Dam Ashraf, RDLD

## 2023-12-09 ENCOUNTER — Encounter (HOSPITAL_COMMUNITY)
Admission: EM | Disposition: A | Discharge status: Home / Self Care | Payer: Self-pay | Source: Home / Self Care | Attending: HOSPITALIST

## 2023-12-09 ENCOUNTER — Inpatient Hospital Stay (HOSPITAL_COMMUNITY): Admitting: Certified Registered"

## 2023-12-09 ENCOUNTER — Encounter (HOSPITAL_COMMUNITY): Payer: Self-pay

## 2023-12-09 DIAGNOSIS — D709 Neutropenia, unspecified: Secondary | ICD-10-CM

## 2023-12-09 DIAGNOSIS — F329 Major depressive disorder, single episode, unspecified: Secondary | ICD-10-CM

## 2023-12-09 DIAGNOSIS — R933 Abnormal findings on diagnostic imaging of other parts of digestive tract: Secondary | ICD-10-CM

## 2023-12-09 DIAGNOSIS — D509 Iron deficiency anemia, unspecified: Secondary | ICD-10-CM

## 2023-12-09 LAB — CBC WITH DIFF
BASOPHIL #: 0 x10ˆ3/uL (ref 0.00–0.10)
BASOPHIL %: 1 % (ref 0–1)
EOSINOPHIL #: 0.1 x10ˆ3/uL (ref 0.00–0.50)
EOSINOPHIL %: 3 % (ref 1–7)
HCT: 22.5 % — ABNORMAL LOW (ref 31.2–41.9)
HGB: 7.3 g/dL — ABNORMAL LOW (ref 10.9–14.3)
LYMPHOCYTE #: 0.8 x10ˆ3/uL — ABNORMAL LOW (ref 1.10–3.10)
LYMPHOCYTE %: 34 % (ref 16–46)
MCH: 23.7 pg — ABNORMAL LOW (ref 24.7–32.8)
MCHC: 32.5 g/dL (ref 32.3–35.6)
MCV: 73 fL — ABNORMAL LOW (ref 75.5–95.3)
MONOCYTE #: 0.5 x10ˆ3/uL (ref 0.20–0.90)
MONOCYTE %: 19 % — ABNORMAL HIGH (ref 4–11)
MPV: 7.2 fL — ABNORMAL LOW (ref 7.9–10.8)
NEUTROPHIL #: 1 x10ˆ3/uL — ABNORMAL LOW (ref 1.90–8.20)
NEUTROPHIL %: 42 % — ABNORMAL LOW (ref 43–77)
PLATELETS: 255 x10ˆ3/uL (ref 140–440)
RBC: 3.09 x10ˆ6/uL — ABNORMAL LOW (ref 3.63–4.92)
RDW: 15.9 % (ref 12.3–17.7)
WBC: 2.4 x10ˆ3/uL — ABNORMAL LOW (ref 3.8–11.8)

## 2023-12-09 LAB — POC BLOOD GLUCOSE (RESULTS)
GLUCOSE, POC: 92 mg/dL (ref 70–100)
GLUCOSE, POC: 78 mg/dL (ref 70–100)
GLUCOSE, POC: 115 mg/dL — ABNORMAL HIGH (ref 70–100)
GLUCOSE, POC: 182 mg/dL — ABNORMAL HIGH (ref 70–100)
GLUCOSE, POC: 95 mg/dL (ref 70–100)

## 2023-12-09 LAB — COMPREHENSIVE METABOLIC PANEL, NON-FASTING
ALBUMIN/GLOBULIN RATIO: 1.1 (ref 0.8–1.4)
ALBUMIN: 2.9 g/dL — ABNORMAL LOW (ref 3.5–5.7)
ALKALINE PHOSPHATASE: 68 U/L (ref 34–104)
ALT (SGPT): 17 U/L (ref 7–52)
ANION GAP: 5 mmol/L (ref 4–13)
AST (SGOT): 21 U/L (ref 13–39)
BILIRUBIN TOTAL: 0.3 mg/dL (ref 0.3–1.0)
BUN/CREA RATIO: 12 (ref 6–22)
BUN: 10 mg/dL (ref 7–25)
CALCIUM, CORRECTED: 9.6 mg/dL (ref 8.9–10.8)
CALCIUM: 8.7 mg/dL (ref 8.6–10.3)
CHLORIDE: 109 mmol/L — ABNORMAL HIGH (ref 98–107)
CO2 TOTAL: 27 mmol/L (ref 21–31)
CREATININE: 0.86 mg/dL (ref 0.60–1.30)
ESTIMATED GFR: 77 mL/min/1.73mˆ2 (ref >59)
GLOBULIN: 2.6 (ref 2.0–3.5)
GLUCOSE: 77 mg/dL (ref 74–109)
OSMOLALITY, CALCULATED: 279 mosm/kg (ref 270–290)
POTASSIUM: 3.7 mmol/L (ref 3.5–5.1)
PROTEIN TOTAL: 5.5 g/dL — ABNORMAL LOW (ref 6.4–8.9)
SODIUM: 141 mmol/L (ref 136–145)

## 2023-12-09 LAB — C. DIFFICILE PCR
C. DIFFICILE TOXIN GENE, PCR: NEGATIVE
PRESUMPTIVE 027/NAP1/BI: NEGATIVE

## 2023-12-09 LAB — MAGNESIUM: MAGNESIUM: 1.8 mg/dL — ABNORMAL LOW (ref 1.9–2.7)

## 2023-12-09 LAB — HAPTOGLOBIN, SERUM: HAPTOGLOBLIN: 130 mg/dL (ref 44–215)

## 2023-12-09 LAB — E. COLI SHIGA TOXIN
SHIGA TOXIN 1: NEGATIVE
SHIGA TOXIN 2: NEGATIVE

## 2023-12-09 LAB — LDH: LDH: 153 U/L (ref 140–271)

## 2023-12-09 LAB — PHOSPHORUS: PHOSPHORUS: 3.4 mg/dL — ABNORMAL LOW (ref 3.7–7.2)

## 2023-12-09 SURGERY — GASTROSCOPY WITH BIOPSY
Anesthesia: General | Wound class: Clean Contaminated Wounds-The respiratory, GI, Genital, or urinary

## 2023-12-09 MED ORDER — LIDOCAINE (PF) 20 MG/ML (2 %) INJECTION SOLUTION
INTRAMUSCULAR | Status: AC
Start: 2023-12-09 — End: 2023-12-09
  Filled 2023-12-09: qty 5

## 2023-12-09 MED ORDER — PROPOFOL 10 MG/ML IV BOLUS
INJECTION | Freq: Once | INTRAVENOUS | Status: DC | PRN
Start: 2023-12-09 — End: 2023-12-09
  Administered 2023-12-09: 50 mg via INTRAVENOUS
  Administered 2023-12-09: 25 mg via INTRAVENOUS
  Administered 2023-12-09: 50 mg via INTRAVENOUS
  Administered 2023-12-09: 25 mg via INTRAVENOUS

## 2023-12-09 MED ORDER — PROPOFOL 10 MG/ML INTRAVENOUS EMULSION
INTRAVENOUS | Status: AC
Start: 2023-12-09 — End: 2023-12-09
  Filled 2023-12-09: qty 20

## 2023-12-09 MED ORDER — PHENYLEPHRINE 1 MG/10 ML (100 MCG/ML) IN 0.9 % SOD.CHLORIDE IV SYRINGE
INJECTION | INTRAVENOUS | Status: AC
Start: 2023-12-09 — End: 2023-12-09
  Filled 2023-12-09: qty 10

## 2023-12-09 MED ORDER — LACTATED RINGERS INTRAVENOUS SOLUTION
INTRAVENOUS | Status: DC | PRN
Start: 2023-12-09 — End: 2023-12-09

## 2023-12-09 MED ORDER — LIDOCAINE (PF) 100 MG/5 ML (2 %) INTRAVENOUS SYRINGE
INJECTION | Freq: Once | INTRAVENOUS | Status: DC | PRN
Start: 2023-12-09 — End: 2023-12-09
  Administered 2023-12-09: 80 mg via INTRAVENOUS

## 2023-12-09 SURGICAL SUPPLY — 3 items
CLEANER INSTRUMENT PRE-KLENZ 13.5 OZ (MISCELLANEOUS PT CARE ITEMS) ×1 IMPLANT
FORCEPS BIOPSY NEEDLE 240CM 2.2MM RJ 4 2.8MM STD CPC STRL DISP ORNG (ENDOSCOPIC SUPPLIES) ×1 IMPLANT
VALVE AIR/H20 DEFENDO BUTTON KIT SUCT BIOPSY STRL DISP (ENDOSCOPIC SUPPLIES) ×1 IMPLANT

## 2023-12-09 NOTE — Anesthesia Postprocedure Evaluation (Signed)
 Anesthesia Post Op Evaluation    Patient: Martha White  Procedure(s):  EGD with biopsy  SIGMOIDOSCOPY FLEXIBLE with stool sample collection    Last Vitals:Temperature: 36.7 C (98.1 F) (12/09/23 1128)  Heart Rate: 70 (12/09/23 1259)  BP (Non-Invasive): 104/61 (12/09/23 1259)  Respiratory Rate: 15 (12/09/23 1259)  SpO2: 100 % (12/09/23 1259)    No notable events documented.    Patient is sufficiently recovered from the effects of anesthesia to participate in the evaluation and has returned to their pre-procedure level.  Patient location during evaluation: PACU       Patient participation: complete - patient participated  Level of consciousness: awake and alert and responsive to verbal stimuli    Pain management: adequate  Airway patency: patent    Anesthetic complications: no  Cardiovascular status: acceptable  Respiratory status: acceptable  Hydration status: acceptable  Patient post-procedure temperature: Pt Normothermic   PONV Status: Absent

## 2023-12-09 NOTE — Progress Notes (Signed)
 [place] [facility]    HOSPITALIST PROGRESS NOTE    Martha White  Date of service: 12/09/2023  Date of Admission:  12/06/2023  Hospital Day:  LOS: 2 days     Interval History:   Patient was seen and evaluated at bedside   Is planned for EGD and colonoscopy today hemoglobin continues to trend down.      Intake & Output:    Intake/Output Summary (Last 24 hours) at 12/09/2023 1008  Last data filed at 12/08/2023 1800  Gross per 24 hour   Intake 610 ml   Output --   Net 610 ml       Vital Signs:  Filed Vitals:    12/09/23 0352 12/09/23 0652 12/09/23 0721 12/09/23 0933   BP: 106/82  119/76    Pulse: 79  (!) 102    Resp: 16 19 20 18    Temp: 36.8 C (98.3 F)  36.9 C (98.5 F)    SpO2: 96%  97%         acetaminophen  (TYLENOL ) tablet, 650 mg, Oral, Q4H PRN  amLODIPine  (NORVASC ) tablet, 10 mg, Oral, Daily  atorvastatin  (LIPITOR) tablet, 80 mg, Oral, Daily  cholecalciferol  (VITAMIN D3) 1000 unit (25 mcg) tablet, 1,000 Units, Oral, Daily  Correction/SSIP insulin  lispro 100 units/mL injection, 1-5 Units, Subcutaneous, 4x/day AC  dextrose  (GLUTOSE) 40% oral gel, 15 g, Oral, Q15 Min PRN  dextrose  (GLUTOSE) 40% oral gel, 15 g, Oral, Q15 Min PRN  dextrose  50% (0.5 g/mL) injection - syringe, 12.5 g, Intravenous, Q15 Min PRN  dextrose  50% (0.5 g/mL) injection - syringe, 25 g, Intravenous, Once  dextrose  50% (0.5 g/mL) injection - syringe, 12.5 g, Intravenous, Q15 Min PRN  DULoxetine  (CYMBALTA ) delayed release capsule, 60 mg, Oral, 2x/day  glucagon  injection 1 mg, 1 mg, IntraMUSCULAR, Once PRN  glucagon  injection 1 mg, 1 mg, IntraMUSCULAR, Once PRN  HYDROmorphone  (DILAUDID ) 2 mg/mL injection, 0.5 mg, Intravenous, Q4H PRN  methocarbamol  (ROBAXIN ) tablet, 500 mg, Oral, 4x/day PRN  metoprolol  succinate (TOPROL -XL) 24 hr extended release tablet, 50 mg, Oral, Daily  NS flush syringe, 10 mL, Intravenous, Q8H  ondansetron  (ZOFRAN ) 2 mg/mL injection, 4 mg, Intravenous, Q6H PRN  pantoprazole  (PROTONIX ) 4 mg/mL injection,  40 mg, Intravenous, Q12H  pregabalin  (LYRICA ) capsule, 50 mg, Oral, NIGHTLY  prochlorperazine  (COMPAZINE ) 5 mg/mL injection, 10 mg, Intravenous, Q8H PRN  QUEtiapine  (SEROquel ) tablet, 50 mg, Oral, NIGHTLY  rOPINIRole  (REQUIP ) tablet, 1 mg, Oral, 3x/day  traMADol  (ULTRAM ) tablet, 50 mg, Oral, Q6H PRN  traZODone  (DESYREL ) tablet, 100 mg, Oral, NIGHTLY           Assessment/ Plan:     # symptomatic anemia   # iron -deficiency  Currently on IV Venofer  day 3/3  Consulted Hematology  Pantoprazole  IV  Also consulted GI   Planned for EGD and colonoscopy today     # history of gastrectomy for gastroparesis?    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  Psychiatry did evaluate the patient   Adjusted her medication   Currently on Seroquel  50 mg at bedtime, trazodone  100 mg at bedtime and increase Cymbalta  to 60 mg daily     # diabetes mellitus type 2  #hypertension   #coronary artery disease   # fibromyalgia   # asthma  Continue home meds      Disposition Planning:  Pending clinical course      Gastric Ulcer Prophylaxis:  Pantoprazole  (  Bowel Regimen:  None    DVT/PE Prophylaxis :  SCDs only  Daily Orders (From admission, onward)       Start     Ordered    12/07/23 0345  PT IS INTERMEDIATE RISK FOR VENOUS THROMBOEMBOLISM  (MEDIUM RISK VTE LEVEL)  CONTINUOUS        References:    VTE RISK ASSESSMENT TOOL    12/07/23 0330                   Lines/Drains:         On the day of the encounter, a total of 25 minutes was spent on this patient encounter including review of historical information, examination, documentation and post-visit activities. The time documented excludes procedural time and time spent on wellness portion of the visit.     Martha White Jess Meals, MD  12/09/2023  [facility] MEDICINE HOSPITALIST

## 2023-12-09 NOTE — Nurses Notes (Signed)
 Patient's blood glucose 76. Patient reports feeling lightheaded and weak. 25 mL of dextrose  50% given per MAR due to being symptomatic and NPO for EGD / colonoscopy. Patient reports relief of symptoms.

## 2023-12-09 NOTE — OR Surgeon (Signed)
 [facility]      EGD NOTE               Patient Name: Martha White  Age:  58 y.o.  Sex:  female  MRN:  [id number]  CSN:  [id number]  Date of Birth: 04-04-1965    Medications Given: Per Nurse Anesthetist    Equipment Used: Olympus [id number]    Date: 12/09/23     Indications:   GERD, anemia, hx of gastrectomy    The scope was passed into the esophagus without difficulty under direct visualization. There was mild erythema at the distal esophagus with superficial ulcers at the GE junction. No esophageal varices. A trace hiatal hernia was seen. he Z line appeared slightly irregular.     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 The visualized mucosa in the jejunum appeared normal.   One biopsies was taken from the GEJ. Photograph(s) Taken: Yes; Blood loss: trace;     Impression:  Mild distal esophagitis, grade 2  Superficial ulcers at the GE junction  S/p subtotal gastrectomy     Recommendations:  Continue Omeprazole 40 mg po q day, before dinner  Increase Famotidine  40 mg po bid, before breakfast and q hs  Await result of biopsy  Maintain antireflux precautions including head of bed elevation    Reginald Blanch, MD

## 2023-12-09 NOTE — Consults (Signed)
 Department of Hematology/Oncology   ONCOLOGY CONSULT NOTE      REFERRING PROVIDER:  Hospitalist        Reason for Consult: Iron  deficiency might have to be set up for outpatient IV iron   PCP: Niels Picker    HISTORY OF PRESENT ILLNESS:  Martha White is a 58 y.o. female who presented to the hospital for evaluation of Abnormal lab results, abdominal pain and weakness.  Hematology/oncology consultation was requested for evaluation of Iron  deficiency might have to be set up for outpatient IV iron .     Patient is not known to us  at [facility]     Pertinent hematology history as follows:  CT abdomen pelvis w/o: Postsurgical changes in the stomach. Moderate amount of fecal matter in the colon. There is some focal prominence of the colon wall in the transverse colon. Consider colonoscopy to exclude a colon lesion.   12/09/23 WBC 2.4, ANC 1.00, Hgb 7.3, Hct 22.5, platelet count 255      ROS:   Consitutional: energy level fair, appetite fair, no weight loss, no fever, no chills  HEENT: No epistaxis, no dysphagia, no gum bleeding  Cardiovascular: no chest pain, no palpitations, no orthopnea  Respiratory: No cough, no hemoptysis, no dyspnea, no pleurisy,m no wheezing  Gastrointestinal: No nausea, no vomiting, no hematemesis, no diarrhea, no constipation, no melena  Musculoskeletal: No swollen joints, no joint redness  Skin: No Rash, No nodules, no pruritus, no lesions  Neurologic: No seizures, no headache, no paresthesia  Psychiatric: no depression, no anxiety  Endocrine: no polyuria, no polydipsia    HISTORY:  Past Medical History:   Diagnosis Date    Arthropathy     Asthma     Carpal tunnel syndrome     Carpal tunnel syndrome     Congestive heart failure     COPD (chronic obstructive pulmonary disease)     Diabetes mellitus, type 2     Fibromyalgia     Gastroparesis     Headache     HTN (hypertension)     Lumbar herniated disc     Neuropathy (CMS HCC)     Restless legs syndrome (RLS)      Thyroid  disease     TIA (transient ischemic attack)     TIA (transient ischemic attack)          Past Surgical History:   Procedure Laterality Date    HX CHOLECYSTECTOMY      HX HERNIA REPAIR      HX HERNIA REPAIR      HX HYSTERECTOMY      HX ROTATOR CUFF REPAIR      STOMACH SURGERY      X4         Social History     Socioeconomic History    Marital status: Married     Spouse name: Not on file    Number of children: Not on file    Years of education: Not on file    Highest education level: Not on file   Occupational History    Not on file   Tobacco Use    Smoking status: Never    Smokeless tobacco: Never   Vaping Use    Vaping status: Never Used   Substance and Sexual Activity    Alcohol  use: Never    Drug use: Never    Sexual activity: Never   Other Topics Concern    Not on file   Social History Narrative  Not on file     Social Determinants of Health     Financial Resource Strain: Not on file   Transportation Needs: Not on file   Social Connections: Medium Risk (12/07/2023)    Social Connections     SDOH Social Isolation: 3 to 5 times a week   Intimate Partner Violence: Not on file   Housing Stability: Not on file     Family Medical History:    None         Outpatient Medications Marked as Taking for the 12/06/23 encounter [facility] Encounter)   Medication Sig    magnesium  chloride (SLOW-MAG) 64 mg Oral Tablet, Delayed Release (E.C.) Take 1 Tablet (64 mg total) by mouth Daily Slow mag 71.5 mg-119 mg days 2 tablets daily    promethazine  (PHENERGAN ) 25 mg Oral Tablet Take 1 Tablet (25 mg total) by mouth Every 4 hours as needed for Nausea/Vomiting    rifAXIMin (XIFAXAN) 550 mg Oral Tablet Take 1 Tablet (550 mg total) by mouth Three times a day      Allergies[1]    PHYSICAL EXAM:    BP 124/76   Pulse 99   Temp 36.8 C (98.2 F)   Resp 18   Ht 1.511 m (4' 11.5)   Wt 75 kg (165 lb 5.5 oz)   SpO2 99%   BMI 32.84 kg/m     General: Awake, alert, NAD  HEENT: Head normocephalic, atraumatic.  Mucouse membranes moist.   PERRLA, no pallor, no icterus  Adenopathy: No palpable cervical, axillary, or inguinal adenopathy noted  Neck: No JVD, Supple, no adenopathy, No JVD  Lungs: Clear to auscultation bilaterally.  No rales, rhonchi, no wheezing  Cardiovascular: Regular rate and rhythm, No murmur, no friction rub  Abdomen: Soft, Symmetric, No tenderness, No hepatosplenomegaly, BSA x4  Extremities: No peripheral edema. Warm. No calf tenderness  Skin: Skin warm and dry.  No suspicious lesions  Neurologic: Alert and oriented x3. Cranial nerves II through XII intact. No focal paralysis or asterixis  Psych: Mood and affect congruent for age and gender, Denies SI/HI       DIAGNOSTIC DATA:         LABS:     CBC  Diff   Lab Results   Component Value Date/Time    WBC 2.4 (L) 12/09/2023 06:12 AM    HGB 7.3 (L) 12/09/2023 06:12 AM    HCT 22.5 (L) 12/09/2023 06:12 AM    PLTCNT 255 12/09/2023 06:12 AM    RBC 3.09 (L) 12/09/2023 06:12 AM    MCV 73.0 (L) 12/09/2023 06:12 AM    MCHC 32.5 12/09/2023 06:12 AM    MCH 23.7 (L) 12/09/2023 06:12 AM    RDW 15.9 12/09/2023 06:12 AM    MPV 7.2 (L) 12/09/2023 06:12 AM    Lab Results   Component Value Date/Time    PMNS 42 (L) 12/09/2023 06:12 AM    LYMPHOCYTES 34 12/09/2023 06:12 AM    EOSINOPHIL 3 12/09/2023 06:12 AM    MONOCYTES 19 (H) 12/09/2023 06:12 AM    BASOPHILS 1 12/09/2023 06:12 AM    BASOPHILS 0.00 12/09/2023 06:12 AM    PMNABS 1.00 (L) 12/09/2023 06:12 AM    LYMPHSABS 0.80 (L) 12/09/2023 06:12 AM    EOSABS 0.10 12/09/2023 06:12 AM    MONOSABS 0.50 12/09/2023 06:12 AM    BASABS 0.07 12/06/2023 02:16 PM          Comprehensive Metabolic Profile    Lab Results   Component  Value Date/Time    SODIUM 141 12/09/2023 06:12 AM    POTASSIUM 3.7 12/09/2023 06:12 AM    CHLORIDE 109 (H) 12/09/2023 06:12 AM    CO2 27 12/09/2023 06:12 AM    ANIONGAP 5 12/09/2023 06:12 AM    BUN 10 12/09/2023 06:12 AM    CREATININE 0.86 12/09/2023 06:12 AM    GLUCOSE Negative 12/06/2023 05:35 PM    Lab Results   Component Value  Date/Time    CALCIUM 8.7 12/09/2023 06:12 AM    PHOSPHORUS 3.4 (L) 12/09/2023 06:12 AM    ALBUMIN 2.9 (L) 12/09/2023 06:12 AM    TOTALPROTEIN 5.5 (L) 12/09/2023 06:12 AM    ALKPHOS 68 12/09/2023 06:12 AM    AST 21 12/09/2023 06:12 AM    ALT 17 12/09/2023 06:12 AM    TOTBILIRUBIN 0.3 12/09/2023 06:12 AM          ESTIMATED GFR   Date Value Ref Range Status   12/09/2023 77 >59 mL/min/1.42m^2 Final       IRON    Date Value Ref Range Status   12/07/2023 25 (L) 50 - 212 ug/dL Final     FERRITIN   Date Value Ref Range Status   12/07/2023 6 (L) 11 - 307 ng/mL Final     TOTAL IRON  BINDING CAPACITY   Date Value Ref Range Status   12/07/2023 473 (H) 250 - 450 ug/dL Final     UIBC   Date Value Ref Range Status   12/07/2023 448 (H) 130 - 375 ug/dL Final     IRON  (TRANSFERRIN) SATURATION   Date Value Ref Range Status   12/07/2023 5 (L) 15 - 50 % Final     VITAMIN B 12   Date Value Ref Range Status   12/07/2023 657 180 - 914 pg/mL Final          ASSESSMENT:    ICD-10-CM    1. Symptomatic anemia Active D64.9       2. Lower abdominal pain Active R10.30       3. Dizziness Active R42       4. Weakness Active R53.1       5. Anemia  D64.9          1. Grade 3 microcytic anemia with Hgb of 7.3 on day of consultation. 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 S/p 3 doses of Venofer  300 mg daily. No evidence of hemolysis. Occult GI bleed as well as underlying bone marrow dyscrasia remains on differential.   12/07/23 Ferritin 6, iron  25, TIBC 473, iron  saturation 5%  12/07/23 Vitamin B12 657, folate 29.3  12/06/23 retic 0.99, LDH 153, hapto 130  SPEP pending  12/06/23 IFOB negative  12/06/23 CT abdomen and pelvis w/o: Postsurgical changes in the stomach. Moderate amount of fecal matter in the colon. There is some focal prominence of the colon wall in the transverse colon. Consider colonoscopy to exclude a colon lesion.     2. Leukopenia with neutropenia, transient with WBC  2.4 and ANC 1.00 on day of consultation.     3. Abnormal radiological findings of focal prominence of the colon wall in the transverse colon. Consider colonoscopy to exclude a colon lesion.    PLAN:   1. GI workup. Plan for EGD and colonoscopy today per GI.   2. Anemia workup.  3. Recommend PRBC transfusion for Hgb less than 7.   4. Due to her history of gastric surgery patient  will most likely need IV iron  going forward. We will continue to follow patient once discharged for ongoing monitoring of CBC and iron  levels and replace as needed.   5. Further recommendations will be made following work up results   Martha White was given the chance to ask questions, and these were answered to their satisfaction. The patient is welcome to call with any questions or concerns in the meantime.     Thank you for this consultation and allowing us  to participate in this patients care.    Patient seen independently today, Case was discussed with Primary hematologist/oncologist Dr. CHARLENA Leatherwood, The above plan was advised, ordered, and implemented.   Romualdo Moose, APRN  12/09/2023, 17:30    CC:  Niels DELENA Picker, CNP  [address]  [place] [address]    No referring provider defined for this encounter.         [1]   Allergies  Allergen Reactions    Alcohol  Shortness of Breath     Etoh, not rubbing alcohol       Sulfa (Sulfonamides) Anaphylaxis    Ibuprofen     Influenza Virus Vaccines     Influenza Virus Vaccine, Specific Hives/ Urticaria    Penicillins Hives/ Urticaria    Pneumococcal Vaccine Hives/ Urticaria

## 2023-12-09 NOTE — Anesthesia Preprocedure Evaluation (Signed)
 ANESTHESIA PRE-OP EVALUATION  Planned Procedure: EGD  COLONOSCOPY  Review of Systems     anesthesia history negative     patient summary reviewed  nursing notes reviewed        Pulmonary   COPD and asthma,   Cardiovascular   NYHA Classification:II  Hypertension, well controlled, angina and ECG reviewed , Exercise Tolerance: <4 METS        GI/Hepatic/Renal    GERD and well controlled        Endo/Other    obesity,   type 2 diabetes (BG 113)/ stable    Neuro/Psych/MS    TIA, headaches, fibromyalgia    peripheral neuropathy,  Cancer    negative hematology/oncology ROS,                   ANESTHESIA PRE-OP EVALUATION  Planned Procedure: PORTACATH INSERTION (Chest)  Review of Systems     anesthesia history negative     patient summary reviewed  nursing notes reviewed        Pulmonary   COPD and asthma,   Cardiovascular    Hypertension, well controlled, CHF (2 years ago), ECG reviewed, ACE / ARB inhibitor use and hyperlipidemia ,No peripheral edema,  Exercise Tolerance: > or = 4 METS   ,beta blocker therapy  ,taken in last 24 hours     GI/Hepatic/Renal    GERD and well controlled        Endo/Other    osteoarthritis and drug induced coagulopathy (Plavix last dose 01/22/23),   type 2 diabetes/ controlled with insulin / controlled with oral medications    Neuro/Psych/MS    RLS, TIA (2004), headaches (migraines), back abnormality, fibromyalgia, anxiety, depression, Neck problems    peripheral neuropathy,  Cancer    negative hematology/oncology ROS,                     Physical Assessment      Airway       Mallampati: II    TM distance: 3 FB    Neck ROM: full  Mouth Opening: good.            Dental           (+) edentulous           Pulmonary    Breath sounds clear to auscultation  (-) no rhonchi, no decreased breath sounds, no wheezes, no rales and no stridor     Cardiovascular    Rhythm: regular  Rate: Normal  (-) no friction rub, carotid bruit is not present, no peripheral edema and no murmur     Other  findings              Plan  ASA 3     Planned anesthesia type: MAC           PONV Plan:  I plan to administer pharmcologic prophalaxis antiemetics  POV PLAN:   plan for postoperative opioid use                Anesthesia issues/risks discussed are: Eye /Visual Loss, Nerve Injuries, PONV, Stroke, Aspiration, Difficult Airway, Cardiac Events/MI, Intraoperative Awareness/ Recall, Blood Loss and Sore Throat.  Anesthetic plan and risks discussed with patient  signed consent obtained          Patient's NPO status is appropriate for Anesthesia.           Plan discussed with CRNA.    (For MAC with GETA as back up)  Physical Assessment      Airway       Mallampati: II    TM distance: >3 FB    Neck ROM: full  Mouth Opening: good.            Dental           (+) edentulous           Pulmonary           Cardiovascular    Rhythm: regular  Rate: Normal       Other findings              Plan  ASA 3     Planned anesthesia type: general     general intravenous                    Intravenous induction       Anesthetic plan and risks discussed with patient           Patient's NPO status is appropriate for Anesthesia.                      Patient Vitals for the past 24 hrs:   BP Temp Pulse Resp SpO2   12/09/23 1128 107/70 36.7 C (98.1 F) 90 20 95 %   12/09/23 1031 -- -- 84 -- 95 %   12/09/23 0933 -- -- -- 18 --   12/09/23 0721 119/76 36.9 C (98.5 F) (!) 102 20 97 %   12/09/23 0652 -- -- -- 19 --   12/09/23 0352 106/82 36.8 C (98.3 F) 79 16 96 %   12/08/23 2337 116/63 36.8 C (98.3 F) 82 18 99 %   12/08/23 2230 -- -- 91 -- (!) 88 %   12/08/23 2223 -- -- -- -- 95 %   12/08/23 2039 -- -- -- 19 --   12/08/23 1919 128/76 36.8 C (98.3 F) 88 16 100 %   12/08/23 1528 98/69 36.4 C (97.5 F) 91 18 98 %      Body mass index is 32.84 kg/m.    Social History     Tobacco Use   Smoking Status Never   Smokeless Tobacco Never        Past Medical History:   Diagnosis Date    Arthropathy     Asthma     Carpal tunnel syndrome      Carpal tunnel syndrome     Congestive heart failure     COPD (chronic obstructive pulmonary disease)     Diabetes mellitus, type 2     Fibromyalgia     Gastroparesis     Headache     HTN (hypertension)     Lumbar herniated disc     Neuropathy (CMS HCC)     Restless legs syndrome (RLS)     Thyroid  disease     TIA (transient ischemic attack)     TIA (transient ischemic attack)             Past Surgical History:   Procedure Laterality Date    HX CHOLECYSTECTOMY      HX HERNIA REPAIR      HX HERNIA REPAIR      HX HYSTERECTOMY      HX ROTATOR CUFF REPAIR      STOMACH SURGERY      X4            Current Outpatient Medications   Medication Instructions    albuterol   sulfate (PROVENTIL  OR VENTOLIN  OR PROAIR ) 90 mcg/actuation Inhalation oral inhaler 1-2 Puffs, Inhalation, EVERY 6 HOURS PRN    amLODIPine  (NORVASC ) 10 mg, Oral, Daily    aspirin  (ECOTRIN) 81 mg, Oral, Daily    atorvastatin  calcium (ATORVASTATIN  ORAL) 80 mg, Oral, Daily    azithromycin  (ZITHROMAX ) 500 mg, Oral, EVERY 24 HOURS    bethanechol chloride (URECHOLINE) 5 mg, Oral, Daily    chlorzoxazone  (PARAFON  FORTE) 500 mg, Oral, EVERY 6 HOURS PRN    clobetasoL (TEMOVATE) 0.05 % Ointment APPLY TO THE AFFECTED AREA(S) TWICE DAILY FOR TWO WEEKS; STOP FOR two WEEKS THEN USE AS NEEDED FOR itch. do not USE ON face, groin, OR long term.    clopidogreL (PLAVIX) 75 mg, Oral, Daily    cyanocobalamin (VITAMIN B12) 1,000 mcg/mL Injection Solution Inject 1 mL (1,000 mcg total) under the skin Every 14 days    cyclobenzaprine  (FLEXERIL ) 5 mg, Oral, 3 TIMES DAILY    diphenhydrAMINE  (BANOPHEN ) 50 mg Oral Capsule TAKE 1 CAPSULE BY MOUTH EVERY 6 HOURS AS NEEDED FOR UP TO 7 DAYS (GENERIC FOR BENADRYL )    divalproex (DEPAKOTE ER) 250 mg    docusate sodium (COLACE) 100 mg Oral Capsule 100 mg by oral route.    DULoxetine  (CYMBALTA  DR) 30 mg, Oral, 2 TIMES DAILY    ergocalciferol  (vitamin D2) (DRISDOL) 50,000 Units, Oral, EVERY 7 DAYS    famotidine  (PEPCID ) 20 mg, Oral, Daily    furosemide  (LASIX) 20 mg, DAILY PRN    gabapentin (NEURONTIN) 400 mg, Oral, 3 TIMES DAILY    hydrOXYzine pamoate (VISTARIL) 25 mg, Oral, NIGHTLY    insulin  lispro (HUMALOG  KWIKPEN INSULIN  SUBQ) Subcutaneous, 7-20 units tid before meals    ipratropium/albuterol  sulfate (COMBIVENT INHL) 1 Puff, Inhalation, 4 TIMES DAILY PRN    ketorolac  tromethamine  (TORADOL ) 10 mg, Oral, EVERY 6 HOURS PRN    Levemir FlexPen 60 Units, Subcutaneous, DAILY PRN, (SLIDING SCALE)    lisinopriL (PRINIVIL) 20 mg, Oral, Daily    loratadine (CLARITIN) 10 mg, Oral, DAILY PRN    magnesium  chloride (SLOW-MAG) 64 mg, Daily    MetFORMIN (GLUCOPHAGE) 1,000 mg, Oral, EVERY MORNING WITH BREAKFAST    metoclopramide  (REGLAN ) 100 mcg/mL Oral Solution 5 mL, Oral, 4 TIMES DAILY - BEFORE MEALS AND NIGHTLY    metoprolol  succinate (TOPROL -XL) 50 mg, Oral, Daily    omeprazole (PRILOSEC) 40 mg, Oral, Daily    ondansetron  (ZOFRAN  ODT) 4 mg, Oral, EVERY 8 HOURS PRN    promethazine  (PHENERGAN ) 25 mg, EVERY 4 HOURS PRN    rifAXIMin (XIFAXAN) 550 mg, 3 TIMES DAILY    rOPINIRole  (REQUIP ) 1 mg, Oral, 3 TIMES DAILY    tiZANidine  (ZANAFLEX ) 4 mg, Oral, 3 TIMES DAILY    traMADoL  (ULTRAM ) 50 mg, Oral, EVERY 6 HOURS PRN    traZODone  (DESYREL ) 100 mg, Oral, NIGHTLY    Trulicity 0.75 mg, Subcutaneous, EVERY 7 DAYS    verapamiL (CALAN) 40 mg Oral Tablet 1 Tablet, DAILY PRN       Last CBC  (Last result in the past 2 years)        WBC   HGB   HCT   MCV   Platelets      12/09/23 0612 2.4   7.3   22.5   73.0   255               Last BMP  (Last result in the past 2 years)        Na   K  Cl   CO2   BUN   Cr   Calcium   Glucose   Glucose-Fasting        12/09/23 0612 141   3.7   109   27   10   0.86   8.7   77               Last Hepatic Panel  (Last result in the past 2 years)        Albumin   Total PTN   Total Bili   Direct Bili   Ast/SGOT   Alt/SGPT   Alk Phos        12/09/23 0612 2.9   5.5   0.3     21   17    68                Lab Results   Component Value Date    HA1C 5.6 11/07/2023         EKG      Normal sinus rhythm  Moderate voltage criteria for LVH, may be normal variant ( R in aVL , Cornell product )  Borderline ECG  When compared with ECG of 24-May-2023 19:39,  Inverted T waves have replaced nonspecific T wave abnormality in Inferior leads  Confirmed by Kazienko, Brian ([id number]) on 12/07/2023 9:35:03 AM

## 2023-12-09 NOTE — OR Surgeon (Signed)
 [facility]    FLEXIBLE SIGMOIDOSCOPY          Patient Name: Martha White  Age:  58 y.o.  Sex:  female  MRN:  [id number]  CSN:  [id number]  Date of Birth: Oct 26, 1965    Date: 12/09/23     Procedure:    Flexible Sigmoidoscopy                                              ASA:    per anesthesia     Medications Given:   Per Nurse Anesthetist     Equipment Used:     Olympus [id number]    Indications:     chronic diarrhea, abnormal CT scan     A digital rectal exam was performed with a 360 degree sweep. The prep was poor. The scope was passed to 50 cm. The procedure was aborted due to poor visualization.     Diverticula were not seen within limits of visualization. On withdrawal and on retroflexion view small internal hemorrhoids were seen. Air was withdrawn from the colon prior to the end of the procedure. Patient tolerated procedure well. Stool was collected for C. Diff DNA and GI Panel. Photograph(s) Taken: Yes; Biopsies were not taken; Blood loss: none;      Impressions:  Poor Prep, procedure aborted  Scope passed to 50 cm  Normal visualized mucosa  Stool collected for C. Diff DNA and GI Panel     Recommendations:  High fiber diet  Await results of stool studies  Outpatient colonoscopy     Reginald Blanch, MD

## 2023-12-09 NOTE — Care Plan (Signed)
 Problem: Adult Inpatient Plan of Care  Goal: Plan of Care Review  Outcome: Ongoing (see interventions/notes)  Goal: Patient-Specific Goal (Individualized)  Outcome: Ongoing (see interventions/notes)  Goal: Absence of Hospital-Acquired Illness or Injury  Outcome: Ongoing (see interventions/notes)  Intervention: Identify and Manage Fall Risk  Recent Flowsheet Documentation  Taken 12/09/2023 9176 by Wells SQUIBB, RN  Safety Promotion/Fall Prevention: safety round/check completed  Intervention: Prevent Skin Injury  Recent Flowsheet Documentation  Taken 12/09/2023 9176 by Wells SQUIBB, RN  Skin Protection: adhesive use limited  Intervention: Prevent and Manage VTE (Venous Thromboembolism) Risk  Recent Flowsheet Documentation  Taken 12/09/2023 9176 by Wells SQUIBB, RN  VTE Prevention/Management: ambulation promoted  Intervention: Prevent Infection  Recent Flowsheet Documentation  Taken 12/09/2023 9176 by Wells SQUIBB, RN  Infection Prevention:   promote handwashing   rest/sleep promoted  Goal: Optimal Comfort and Wellbeing  Outcome: Ongoing (see interventions/notes)  Intervention: Provide Person-Centered Care  Recent Flowsheet Documentation  Taken 12/09/2023 9176 by Wells SQUIBB, RN  Trust Relationship/Rapport:   care explained   questions encouraged   questions answered   thoughts/feelings acknowledged  Goal: Rounds/Family Conference  Outcome: Ongoing (see interventions/notes)     Problem: Pain Acute  Goal: Optimal Pain Control and Function  Outcome: Ongoing (see interventions/notes)  Intervention: Optimize Psychosocial Wellbeing  Recent Flowsheet Documentation  Taken 12/09/2023 9176 by Wells SQUIBB, RN  Diversional Activities:   television   smartphone  Intervention: Prevent or Manage Pain  Recent Flowsheet Documentation  Taken 12/09/2023 9176 by Wells SQUIBB, RN  Medication Review/Management: medications reviewed     Problem: Fall Injury Risk  Goal: Absence of Fall and Fall-Related Injury  Outcome: Ongoing (see interventions/notes)  Intervention:  Identify and Manage Contributors  Recent Flowsheet Documentation  Taken 12/09/2023 9176 by Wells SQUIBB, RN  Medication Review/Management: medications reviewed  Intervention: Promote Injury-Free Environment  Recent Flowsheet Documentation  Taken 12/09/2023 9176 by Wells SQUIBB, RN  Safety Promotion/Fall Prevention: safety round/check completed    Patient resting comfortably in bed with spouse at bedside. Patient has remained NPO for EGD / colonoscopy this AM. No voiced concerns at this time. Call light is in reach. Will continue to monitor.

## 2023-12-10 ENCOUNTER — Telehealth (INDEPENDENT_AMBULATORY_CARE_PROVIDER_SITE_OTHER): Payer: Self-pay | Admitting: NURSE PRACTITIONER

## 2023-12-10 DIAGNOSIS — K209 Esophagitis, unspecified without bleeding: Secondary | ICD-10-CM

## 2023-12-10 DIAGNOSIS — R109 Unspecified abdominal pain: Secondary | ICD-10-CM

## 2023-12-10 DIAGNOSIS — K2950 Unspecified chronic gastritis without bleeding: Secondary | ICD-10-CM

## 2023-12-10 DIAGNOSIS — Z9889 Other specified postprocedural states: Secondary | ICD-10-CM

## 2023-12-10 LAB — ROUTINE STOOL CULTURE (INCLUDING E. COLI SHIGA TOXIN): STOOL CULTURE: NORMAL

## 2023-12-10 LAB — COMPREHENSIVE METABOLIC PANEL, NON-FASTING
ALBUMIN/GLOBULIN RATIO: 1.1 (ref 0.8–1.4)
ALBUMIN: 3 g/dL — ABNORMAL LOW (ref 3.5–5.7)
ALKALINE PHOSPHATASE: 70 U/L (ref 34–104)
ALT (SGPT): 16 U/L (ref 7–52)
ANION GAP: 4 mmol/L (ref 4–13)
AST (SGOT): 22 U/L (ref 13–39)
BILIRUBIN TOTAL: 0.3 mg/dL (ref 0.3–1.0)
BUN/CREA RATIO: 8 (ref 6–22)
BUN: 7 mg/dL (ref 7–25)
CALCIUM, CORRECTED: 9.4 mg/dL (ref 8.9–10.8)
CALCIUM: 8.6 mg/dL (ref 8.6–10.3)
CHLORIDE: 108 mmol/L — ABNORMAL HIGH (ref 98–107)
CO2 TOTAL: 28 mmol/L (ref 21–31)
CREATININE: 0.92 mg/dL (ref 0.60–1.30)
ESTIMATED GFR: 71 mL/min/1.73mˆ2 (ref >59)
GLOBULIN: 2.7 (ref 2.0–3.5)
GLUCOSE: 75 mg/dL (ref 74–109)
OSMOLALITY, CALCULATED: 276 mosm/kg (ref 270–290)
POTASSIUM: 3.9 mmol/L (ref 3.5–5.1)
PROTEIN TOTAL: 5.7 g/dL — ABNORMAL LOW (ref 6.4–8.9)
SODIUM: 140 mmol/L (ref 136–145)

## 2023-12-10 LAB — CBC WITH DIFF
BASOPHIL #: 0.1 x10ˆ3/uL (ref 0.00–0.10)
BASOPHIL %: 2 % — ABNORMAL HIGH (ref 0–1)
EOSINOPHIL #: 0.1 x10ˆ3/uL (ref 0.00–0.50)
EOSINOPHIL %: 3 % (ref 1–7)
HCT: 22 % — ABNORMAL LOW (ref 31.2–41.9)
HGB: 7.2 g/dL — ABNORMAL LOW (ref 10.9–14.3)
LYMPHOCYTE #: 1 x10ˆ3/uL — ABNORMAL LOW (ref 1.10–3.10)
LYMPHOCYTE %: 33 % (ref 16–46)
MCH: 23.9 pg — ABNORMAL LOW (ref 24.7–32.8)
MCHC: 32.9 g/dL (ref 32.3–35.6)
MCV: 72.7 fL — ABNORMAL LOW (ref 75.5–95.3)
MONOCYTE #: 0.5 x10ˆ3/uL (ref 0.20–0.90)
MONOCYTE %: 18 % — ABNORMAL HIGH (ref 4–11)
MPV: 7.2 fL — ABNORMAL LOW (ref 7.9–10.8)
NEUTROPHIL #: 1.3 x10ˆ3/uL — ABNORMAL LOW (ref 1.90–8.20)
NEUTROPHIL %: 44 % (ref 43–77)
PLATELETS: 264 x10ˆ3/uL (ref 140–440)
RBC: 3.02 x10ˆ6/uL — ABNORMAL LOW (ref 3.63–4.92)
RDW: 16.1 % (ref 12.3–17.7)
WBC: 2.9 x10ˆ3/uL — ABNORMAL LOW (ref 3.8–11.8)

## 2023-12-10 LAB — GI PANEL BY BIOFIRE FILM ARRAY
ADENOVIRUS F 40/41: NOT DETECTED
ASTROVIRUS: NOT DETECTED
CAMPYLOBACTER: NOT DETECTED
CRYPTOSPORIDIUM: NOT DETECTED
CYCLOSPORA CAYETANENSIS: NOT DETECTED
ENTAMOEBA HISTOLYTICA: NOT DETECTED
ENTEROAGGREGATIVE E. COLI (EAEC): NOT DETECTED
ENTEROPATHOGENIC E COLI (EPEC): NOT DETECTED
ENTEROTOXIGENIC E COLI (ETEC) LT/ST: NOT DETECTED
GIARDIA LAMBLIA: NOT DETECTED
NOROVIRUS GI/GII: NOT DETECTED
PLESIOMONAS SHIGELLOIDES: NOT DETECTED
ROTAVIRUS A: NOT DETECTED
SALMONELLA SPECIES: NOT DETECTED
SAPOVIRUS: NOT DETECTED
SHIGA-LIKE TOXIN-PRODUCING E COLI (STEC) STX1/STX2: NOT DETECTED
SHIGELLA/ENTEROINVASIVE E COLI (EIEC): NOT DETECTED
VIBRIO CHOLERAE: NOT DETECTED
VIBRIO: NOT DETECTED
YERSINIA ENTEROCOLITICA: NOT DETECTED

## 2023-12-10 LAB — POC BLOOD GLUCOSE (RESULTS)
GLUCOSE, POC: 106 mg/dL — ABNORMAL HIGH (ref 70–100)
GLUCOSE, POC: 87 mg/dL (ref 70–100)
GLUCOSE, POC: 88 mg/dL (ref 70–100)

## 2023-12-10 LAB — MAGNESIUM: MAGNESIUM: 1.7 mg/dL — ABNORMAL LOW (ref 1.9–2.7)

## 2023-12-10 LAB — SURGICAL PATHOLOGY SPECIMEN

## 2023-12-10 LAB — PHOSPHORUS: PHOSPHORUS: 3.3 mg/dL — ABNORMAL LOW (ref 3.7–7.2)

## 2023-12-10 MED ORDER — PREGABALIN 50 MG CAPSULE
50.0000 mg | ORAL_CAPSULE | Freq: Every evening | ORAL | 0 refills | Status: DC
Start: 2023-12-10 — End: 2024-01-03

## 2023-12-10 MED ORDER — FAMOTIDINE 40 MG TABLET
40.0000 mg | ORAL_TABLET | Freq: Two times a day (BID) | ORAL | 0 refills | Status: AC
Start: 2023-12-10 — End: 2024-02-06

## 2023-12-10 MED ORDER — QUETIAPINE 50 MG TABLET
50.0000 mg | ORAL_TABLET | Freq: Every evening | ORAL | 0 refills | Status: AC
Start: 2023-12-10 — End: 2024-02-06

## 2023-12-10 MED ORDER — PANTOPRAZOLE 40 MG TABLET,DELAYED RELEASE
40.0000 mg | DELAYED_RELEASE_TABLET | Freq: Two times a day (BID) | ORAL | 0 refills | Status: DC
Start: 2023-12-10 — End: 2023-12-10

## 2023-12-10 NOTE — Consults (Signed)
 Department of Hematology/Oncology   ONCOLOGY CONSULT NOTE      REFERRING PROVIDER:  Hospitalist        Reason for Consult: Iron  deficiency might have to be set up for outpatient IV iron   PCP: Niels Picker    HISTORY OF PRESENT ILLNESS:  Martha White is a 58 y.o. female who presented to the hospital for evaluation of Abnormal lab results, abdominal pain and weakness.  Hematology/oncology consultation was requested for evaluation of Iron  deficiency might have to be set up for outpatient IV iron . Patient reports a history of gastric surgeries due to gastroparesis and reports that 95% of stomach has been removed. She takes Vitamin B12 injections weekly.     Patient is not known to us  at [facility].     Pertinent hematology history as follows:  12/06/23: CT abdomen pelvis w/o: Postsurgical changes in the stomach. Moderate amount of fecal matter in the colon. There is some focal prominence of the colon wall in the transverse colon. Consider colonoscopy to exclude a colon lesion.   12/09/23 WBC 2.4, ANC 1.00, Hgb 7.3, Hct 22.5, platelet count 255  12/09/23 EGD: Mild distal esophagitis, grade, Superficial ulcers at the GE junction, S/p subtotal gastrectomy  12/09/23 Flexible sigmoidoscopy: Poor Prep, procedure aborted, Scope passed to 50 cm, Normal visualized mucosa, Stool collected for C. Diff DNA and GI Panel.   Patient will be scheduled for outpatient colonoscopy with Dr. LOIS Blanch  12/10/23 WBC 2.9, ANC 1.30, ALC 1.00, Hgb 7.2, Hct 22.0, platelet count 264    Subjective: Patient sitting up in vascular chair. She continues to report shortness of breath and dull intermittent pain in left breast. Patient reports last mammogram was completed several years ago. She denies any signs of bleeding. She reports nausea but no vomiting.     ROS:   Consitutional: energy level fair, appetite fair, no weight loss, no fever, no chills  HEENT: No epistaxis, no dysphagia, no gum bleeding  Cardiovascular: no  chest pain, no palpitations, no orthopnea  Respiratory: No cough, no hemoptysis, + dyspnea, no pleurisy, no wheezing  Gastrointestinal: + nausea, no vomiting, no hematemesis, no diarrhea, no constipation, no melena  Musculoskeletal: No swollen joints, no joint redness, + generalized joint pain especially lower extremities   Skin: No Rash, No nodules, no pruritus, no lesions  Neurologic: No seizures, no headache, no paresthesia  Psychiatric: no depression, no anxiety  Endocrine: no polyuria, no polydipsia    HISTORY:  Past Medical History:   Diagnosis Date    Arthropathy     Asthma     Carpal tunnel syndrome     Carpal tunnel syndrome     Congestive heart failure     COPD (chronic obstructive pulmonary disease)     Diabetes mellitus, type 2     Fibromyalgia     Gastroparesis     Headache     HTN (hypertension)     Lumbar herniated disc     Neuropathy (CMS HCC)     Restless legs syndrome (RLS)     Thyroid  disease     TIA (transient ischemic attack)     TIA (transient ischemic attack)        Past Surgical History:   Procedure Laterality Date    HX CHOLECYSTECTOMY      HX HERNIA REPAIR      HX HERNIA REPAIR      HX HYSTERECTOMY      HX ROTATOR CUFF REPAIR  STOMACH SURGERY      X4         Social History     Socioeconomic History    Marital status: Married     Spouse name: Not on file    Number of children: Not on file    Years of education: Not on file    Highest education level: Not on file   Occupational History    Not on file   Tobacco Use    Smoking status: Never    Smokeless tobacco: Never   Vaping Use    Vaping status: Never Used   Substance and Sexual Activity    Alcohol  use: Never    Drug use: Never    Sexual activity: Never   Other Topics Concern    Not on file   Social History Narrative    Not on file     Social Determinants of Health     Financial Resource Strain: Not on file   Transportation Needs: Not on file   Social Connections: Medium Risk (12/07/2023)    Social Connections     SDOH Social Isolation: 3  to 5 times a week   Intimate Partner Violence: Not on file   Housing Stability: Not on file     Family Medical History:    None         Outpatient Medications Marked as Taking for the 12/06/23 encounter [facility] Encounter)   Medication Sig    magnesium  chloride (SLOW-MAG) 64 mg Oral Tablet, Delayed Release (E.C.) Take 1 Tablet (64 mg total) by mouth Daily Slow mag 71.5 mg-119 mg days 2 tablets daily    promethazine  (PHENERGAN ) 25 mg Oral Tablet Take 1 Tablet (25 mg total) by mouth Every 4 hours as needed for Nausea/Vomiting    rifAXIMin (XIFAXAN) 550 mg Oral Tablet Take 1 Tablet (550 mg total) by mouth Three times a day      Allergies[1]    PHYSICAL EXAM:    BP 114/72   Pulse 76   Temp 36.4 C (97.6 F)   Resp 18   Ht 1.511 m (4' 11.5)   Wt 75 kg (165 lb 5.5 oz)   SpO2 98%   BMI 32.84 kg/m     General: Awake, alert, NAD   HEENT: Head normocephalic, atraumatic.  Mucouse membranes moist.  PERRLA, no pallor, no icterus  Adenopathy: No palpable cervical, axillary, or inguinal adenopathy noted  Neck: No JVD, Supple, no adenopathy, No JVD  Lungs: Clear to auscultation bilaterally.  No rales, rhonchi, no wheezing  Cardiovascular: Regular rate and rhythm, No murmur, no friction rub  Abdomen: Soft, Symmetric, No tenderness, BSA x4  Extremities: No peripheral edema. Warm. No calf tenderness  Skin: Skin warm and dry.  No suspicious lesions  Neurologic: Alert and oriented x3. Cranial nerves II through XII intact. No focal paralysis or asterixis  Psych: Mood and affect congruent for age and gender, Denies SI/HI   Breast: No lumps or masses palpated. Left breast pain with palpation in inner quadrant at approximately 10 o'clock position. No masses palpated. No redness or skin changes. No nipple discharge or swelling.     DIAGNOSTIC DATA:         LABS:     CBC  Diff   Lab Results   Component Value Date/Time    WBC 2.9 (L) 12/10/2023 05:48 AM    HGB 7.2 (L) 12/10/2023 05:48 AM    HCT 22.0 (L) 12/10/2023 05:48 AM    PLTCNT  264 12/10/2023 05:48 AM    RBC 3.02 (L) 12/10/2023 05:48 AM    MCV 72.7 (L) 12/10/2023 05:48 AM    MCHC 32.9 12/10/2023 05:48 AM    MCH 23.9 (L) 12/10/2023 05:48 AM    RDW 16.1 12/10/2023 05:48 AM    MPV 7.2 (L) 12/10/2023 05:48 AM    Lab Results   Component Value Date/Time    PMNS 44 12/10/2023 05:48 AM    LYMPHOCYTES 33 12/10/2023 05:48 AM    EOSINOPHIL 3 12/10/2023 05:48 AM    MONOCYTES 18 (H) 12/10/2023 05:48 AM    BASOPHILS 2 (H) 12/10/2023 05:48 AM    BASOPHILS 0.10 12/10/2023 05:48 AM    PMNABS 1.30 (L) 12/10/2023 05:48 AM    LYMPHSABS 1.00 (L) 12/10/2023 05:48 AM    EOSABS 0.10 12/10/2023 05:48 AM    MONOSABS 0.50 12/10/2023 05:48 AM    BASABS 0.07 12/06/2023 02:16 PM          Comprehensive Metabolic Profile    Lab Results   Component Value Date/Time    SODIUM 140 12/10/2023 05:48 AM    POTASSIUM 3.9 12/10/2023 05:48 AM    CHLORIDE 108 (H) 12/10/2023 05:48 AM    CO2 28 12/10/2023 05:48 AM    ANIONGAP 4 12/10/2023 05:48 AM    BUN 7 12/10/2023 05:48 AM    CREATININE 0.92 12/10/2023 05:48 AM    GLUCOSE Negative 12/06/2023 05:35 PM    Lab Results   Component Value Date/Time    CALCIUM 8.6 12/10/2023 05:48 AM    PHOSPHORUS 3.3 (L) 12/10/2023 05:48 AM    ALBUMIN 3.0 (L) 12/10/2023 05:48 AM    TOTALPROTEIN 5.7 (L) 12/10/2023 05:48 AM    ALKPHOS 70 12/10/2023 05:48 AM    AST 22 12/10/2023 05:48 AM    ALT 16 12/10/2023 05:48 AM    TOTBILIRUBIN 0.3 12/10/2023 05:48 AM          ESTIMATED GFR   Date Value Ref Range Status   12/10/2023 71 >59 mL/min/1.6m^2 Final       IRON    Date Value Ref Range Status   12/07/2023 25 (L) 50 - 212 ug/dL Final     FERRITIN   Date Value Ref Range Status   12/07/2023 6 (L) 11 - 307 ng/mL Final     TOTAL IRON  BINDING CAPACITY   Date Value Ref Range Status   12/07/2023 473 (H) 250 - 450 ug/dL Final     UIBC   Date Value Ref Range Status   12/07/2023 448 (H) 130 - 375 ug/dL Final     IRON  (TRANSFERRIN) SATURATION   Date Value Ref Range Status   12/07/2023 5 (L) 15 - 50 % Final     VITAMIN B  12   Date Value Ref Range Status   12/07/2023 657 180 - 914 pg/mL Final          ASSESSMENT:    ICD-10-CM    1. Symptomatic anemia Active D64.9       2. Lower abdominal pain Active R10.30       3. Dizziness Active R42       4. Weakness Active R53.1       5. Anemia  D64.9          1. Grade 3 microcytic anemia with Hgb of 7.3 on day of consultation. 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 Hemoglobin has been trending down over the past several months.  S/p 3 doses of Venofer  300 mg daily 9/20-9/22. No evidence of hemolysis. Occult GI bleed as well as underlying bone marrow dyscrasia remains on differential.   12/07/23 Ferritin 6, iron  25, TIBC 473, iron  saturation 5%  12/07/23 Vitamin B12 657, folate 29.3  12/06/23 retic 0.99, LDH 153, hapto 130  SPEP pending  12/06/23 IFOB negative  12/06/23 CT abdomen and pelvis w/o: Postsurgical changes in the stomach. Moderate amount of fecal matter in the colon. There is some focal prominence of the colon wall in the transverse colon. Consider colonoscopy to exclude a colon lesion.     2. Leukopenia with neutropenia, transient with WBC 2.4 and ANC 1.00 on day of consultation. WBC has fluctuated over the past 2 years. Underlying autoimmune condition as well as underlying bone marrow dyscrasia remain on differential.   ANA, RA factor pending  Flow cytometry pending  Copper  pending    3. Abnormal radiological findings of focal prominence of the colon wall in the transverse colon. Consider colonoscopy to exclude a colon lesion.    4. History gastrectomy    5. Generalized abdominal pain  [facility] GI panel negative  CDT negative     6.) Nausea    7.) History of gastroparesis     PLAN:   1. GI workup. Due to poor prep, repeat outpatient colonoscopy will be rescheduled outpatient with Dr. LOIS Blanch.   2. Anemia workup. SPEP pending.  3. Recommend PRBC transfusion for Hgb less than 7.   4. Recommend outpatient diagnostic  mammogram.  5. Due to her history of gastric surgery patient will most likely need IV iron  going forward. We will continue to follow patient once discharged for ongoing monitoring of CBC and iron  levels and replace as needed. Patient will need a follow up with Hem/Onc clinic in the next 2-3 weeks.   6. Further recommendations will be made following work up results   Penny Arrambide was given the chance to ask questions, and these were answered to their satisfaction. The patient is welcome to call with any questions or concerns in the meantime.     Thank you for this consultation and allowing us  to participate in this patients care.    Patient seen independently today, Case was discussed with Primary hematologist/oncologist Dr. CHARLENA Leatherwood, The above plan was advised, ordered, and implemented.     Romualdo Moose, APRN  12/10/2023, 11:29    CC:  Niels DELENA Picker, CNP  [address]  [place] [address]    No referring provider defined for this encounter.           [1]   Allergies  Allergen Reactions    Alcohol  Shortness of Breath     Etoh, not rubbing alcohol       Sulfa (Sulfonamides) Anaphylaxis    Ibuprofen     Influenza Virus Vaccines     Influenza Virus Vaccine, Specific Hives/ Urticaria    Penicillins Hives/ Urticaria    Pneumococcal Vaccine Hives/ Urticaria

## 2023-12-10 NOTE — Discharge Summary (Signed)
 [place] MEDICINE [facility]     DISCHARGE SUMMARY      PATIENT NAME:  Martha White   MRN:  [id number]  DOB:  16-Jul-1965    INPATIENT ADMISSION DATE: 12/06/2023   DATE OF DISCHARGE:  12/10/23     ATTENDING PHYSICIAN:  Lendia Riesa Skiff, MD    HOSPITAL PRESENTATION:  Please see full admission H&P for details.      HOSPITAL COURSE:    58 year old female with a past medical history significant for asthma, CHF, Chronic Obstructive Pulmonary Disease, type 2 diabetes mellitus, gastroparesis status post gastrectomy, fibromyalgia and depression presented with weakness.  Was noted to have anemia.  She was found to have severe iron -deficiency anemia.  She was not able to observe oral iron  given her history of gastrectomy.  Was started on IV iron  and was given 3 doses.  Was also started on pantoprazole .  GI was consulted and she underwent endoscopy on 12/09/2023 which showed superficial ulcers and some distal esophagitis grade 2.  Colonoscopy was aborted because of poor prep.  Plan for colonoscopy outpatient.  She will continue omeprazole 40 mg before dinner and famotidine  40 mg twice daily.  Hematology saw the patient and ordered workup which she will she will follow up outpatient.  She will also follow up with them outpatient for IV iron  transfusions.    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     Active Non-Hospital Problems     Diagnosis Date Noted    Leukopenia 06/20/2023    Difficult intravenous access 06/20/2023       Nutrition:   GUEST TRAY  GUEST TRAY  DIET REGULAR Do you want to initiate MNT Protocol? Yes; Additional modifications/limitations: GI/SOFT, HIGH FIBER    Energy Calorie Requirements: 1364 kcal/day (30 kcal/kg IBW)  Protein Requirements (gms/day): 45 gm/day (1.0 gm/kg IBW)     Additional clinical characteristics related to nutrition:    - monitor for weight changes   - monitor intake and output    - monitor bowel functions        Lab Results   Component Value Date    ALBUMIN 3.0 (L) 12/10/2023        PHYSICAL EXAM DAY OF DISCHARGE:  BP 105/70   Pulse 81   Temp 36.7 C (98.1 F)   Resp 20   Ht 1.511 m (4' 11.5)   Wt 75 kg (165 lb 5.5 oz)   SpO2 99%   BMI 32.84 kg/m        Physical Exam    LABS WITHIN LAST 24 HOURS:   Results for orders placed or performed during the hospital encounter of 12/06/23 (from the past 24 hours)   POC BLOOD GLUCOSE (RESULTS)   Result Value Ref Range    GLUCOSE, POC 182 (H) 70 - 100 mg/dl   POC BLOOD GLUCOSE (  RESULTS)   Result Value Ref Range    GLUCOSE, POC 95 70 - 100 mg/dl   POC BLOOD GLUCOSE (RESULTS)   Result Value Ref Range    GLUCOSE, POC 88 70 - 100 mg/dl   COMPREHENSIVE METABOLIC PANEL, NON-FASTING   Result Value Ref Range    SODIUM 140 136 - 145 mmol/L    POTASSIUM 3.9 3.5 - 5.1 mmol/L    CHLORIDE 108 (H) 98 - 107 mmol/L    CO2 TOTAL 28 21 - 31 mmol/L    ANION GAP 4 4 - 13 mmol/L    BUN 7 7 - 25 mg/dL    CREATININE 9.07 9.39 - 1.30 mg/dL    BUN/CREA RATIO 8 6 - 22    ESTIMATED GFR 71 >59 mL/min/1.42m^2    ALBUMIN 3.0 (L) 3.5 - 5.7 g/dL    CALCIUM 8.6 8.6 - 89.6 mg/dL    GLUCOSE 75 74 - 890 mg/dL    ALKALINE PHOSPHATASE 70 34 - 104 U/L    ALT (SGPT) 16 7 - 52 U/L    AST (SGOT) 22 13 - 39 U/L    BILIRUBIN TOTAL 0.3 0.3 - 1.0 mg/dL    PROTEIN TOTAL 5.7 (L) 6.4 - 8.9 g/dL    ALBUMIN/GLOBULIN RATIO 1.1 0.8 - 1.4    OSMOLALITY, CALCULATED 276 270 - 290 mOsm/kg    CALCIUM, CORRECTED 9.4  8.9 - 10.8 mg/dL    GLOBULIN 2.7 2.0 - 3.5   MAGNESIUM    Result Value Ref Range    MAGNESIUM  1.7 (L) 1.9 - 2.7 mg/dL   PHOSPHORUS   Result Value Ref Range    PHOSPHORUS 3.3 (L) 3.7 - 7.2 mg/dL   CBC WITH DIFF   Result Value Ref Range    WBC 2.9 (L) 3.8 - 11.8 x10^3/uL    RBC 3.02 (L) 3.63 - 4.92 x10^6/uL    HGB 7.2 (L) 10.9 - 14.3 g/dL    HCT 77.9 (L) 68.7 - 41.9 %    MCV 72.7 (L) 75.5 - 95.3 fL    MCH 23.9 (L) 24.7 - 32.8 pg    MCHC 32.9 32.3 - 35.6 g/dL    RDW 83.8 87.6 - 82.2 %    PLATELETS 264 140 - 440 x10^3/uL    MPV 7.2 (L) 7.9 - 10.8 fL    NEUTROPHIL % 44 43 - 77 %    LYMPHOCYTE % 33 16 - 46 %    MONOCYTE % 18 (H) 4 - 11 %    EOSINOPHIL % 3 1 - 7 %    BASOPHIL % 2 (H) 0 - 1 %    NEUTROPHIL # 1.30 (L) 1.90 - 8.20 x10^3/uL    LYMPHOCYTE # 1.00 (L) 1.10 - 3.10 x10^3/uL    MONOCYTE # 0.50 0.20 - 0.90 x10^3/uL    EOSINOPHIL # 0.10 0.00 - 0.50 x10^3/uL    BASOPHIL # 0.10 0.00 - 0.10 x10^3/uL   POC BLOOD GLUCOSE (RESULTS)   Result Value Ref Range    GLUCOSE, POC 106 (H) 70 - 100 mg/dl   POC BLOOD GLUCOSE (RESULTS)   Result Value Ref Range    GLUCOSE, POC 87 70 - 100 mg/dl        IMAGING WITHIN LAST 24 HOURS:   No results found.  CT CERVICAL SPINE WO IV CONTRAST   Final Result   No acute osseous abnormalities in the cervical spine.         Radiologist location ID: [id number]  CT BRAIN WO IV CONTRAST   Final Result   NO ACUTE FINDINGS         Radiologist location ID: [id number]         XR AP MOBILE CHEST   Final Result   MINIMAL SCARRING OR ATELECTASIS AT THE MID LEFT LUNG.                  Radiologist location ID: [id number]         CT ABDOMEN PELVIS WO IV CONTRAST   Final Result   1. NO ACUTE FINDINGS   2. FOCAL PROMINENCE OF THE COLON WALL IN THE TRANSVERSE COLON. CONSIDER COLONOSCOPY TO EXCLUDE A COLON LESION.            Radiologist location ID: [id number]              MICROBIOLOGY WITHIN LAST 24 HOURS:   No results found for any visits on 12/06/23 (from the past 24 hours).       DISCHARGE  MEDICATIONS:     Current Discharge Medication List        START taking these medications.        Details   pregabalin  50 mg Capsule  Commonly known as: LYRICA    50 mg, Oral, NIGHTLY  Qty: 30 Capsule  Refills: 0     QUEtiapine  50 mg Tablet  Commonly known as: SEROquel    50 mg, Oral, NIGHTLY  Qty: 30 Tablet  Refills: 0            CONTINUE these medications which have CHANGED during your visit.        Details   famotidine  40 mg Tablet  Commonly known as: PEPCID   What changed:   medication strength  how much to take  when to take this   40 mg, Oral, 2 TIMES DAILY  Qty: 60 Tablet  Refills: 0            CONTINUE these medications - NO CHANGES were made during your visit.        Details   albuterol  sulfate 90 mcg/actuation oral inhaler  Commonly known as: PROVENTIL  or VENTOLIN  or PROAIR    1-2 Puffs, Inhalation, EVERY 6 HOURS PRN  Refills: 0     amLODIPine  10 mg Tablet  Commonly known as: NORVASC    10 mg, Oral, Daily  Refills: 0     aspirin  81 mg Tablet, Delayed Release (E.C.)  Commonly known as: ECOTRIN   81 mg, Oral, Daily  Refills: 0     ATORVASTATIN  ORAL   80 mg, Oral, Daily  Refills: 0     bethanechol chloride 10 mg Tablet  Commonly known as: URECHOLINE   5 mg, Oral, Daily  Refills: 0     clopidogreL 75 mg Tablet  Commonly known as: PLAVIX   75 mg, Oral, Daily  Refills: 0     COMBIVENT INHL   1 Puff, Inhalation, 4 TIMES DAILY PRN  Refills: 0     cyanocobalamin 1,000 mcg/mL Solution  Commonly known as: VITAMIN B12   Inject 1 mL (1,000 mcg total) under the skin Every 14 days  Refills: 0     cyclobenzaprine  10 mg Tablet  Commonly known as: FLEXERIL    5 mg, Oral, 3 TIMES DAILY  Refills: 0     docusate sodium 100 mg Capsule  Commonly known as: COLACE   100 mg by oral route.  Refills: 0     DULoxetine  30 mg Capsule, Delayed Release(E.C.)  Commonly known as:  CYMBALTA  DR   30 mg, Oral, 2 TIMES DAILY  Refills: 0     ergocalciferol  (vitamin D2) 1,250 mcg (50,000 unit) Capsule  Commonly known as: DRISDOL   50,000 Units, Oral,  EVERY 7 DAYS  Refills: 0     gabapentin 400 mg Capsule  Commonly known as: NEURONTIN   400 mg, Oral, 3 TIMES DAILY  Refills: 0     HUMALOG  KWIKPEN INSULIN  SUBQ   Subcutaneous, 7-20 units tid before meals  Refills: 0     hydrOXYzine pamoate 25 mg Capsule  Commonly known as: VISTARIL   25 mg, Oral, NIGHTLY  Refills: 0     Levemir FlexPen 100 unit/mL (3 mL) Insulin  Pen  Generic drug: insulin  detemir U-100   60 Units, Subcutaneous, DAILY PRN, (SLIDING SCALE)  Refills: 0     lisinopriL 20 mg Tablet  Commonly known as: PRINIVIL   20 mg, Oral, Daily  Refills: 0     loratadine 10 mg Tablet  Commonly known as: CLARITIN   10 mg, Oral, DAILY PRN  Refills: 0     magnesium  chloride 64 mg Tablet, Delayed Release (E.C.)  Commonly known as: SLOW-MAG   64 mg, Daily  Refills: 0     MetFORMIN 1,000 mg Tablet  Commonly known as: GLUCOPHAGE   1,000 mg, Oral, EVERY MORNING WITH BREAKFAST  Refills: 0     metoclopramide  100 mcg/mL Solution  Commonly known as: REGLAN    5 mL, Oral, 4 TIMES DAILY - BEFORE MEALS AND NIGHTLY  Refills: 0     metoprolol  succinate 50 mg Tablet Sustained Release 24 hr  Commonly known as: TOPROL -XL   50 mg, Oral, Daily  Refills: 0     omeprazole 40 mg Capsule, Delayed Release(E.C.)  Commonly known as: PRILOSEC   40 mg, Oral, Daily  Refills: 0     promethazine  25 mg Tablet  Commonly known as: PHENERGAN    25 mg, EVERY 4 HOURS PRN  Refills: 0     rifAXIMin 550 mg Tablet  Commonly known as: XIFAXAN   550 mg, 3 TIMES DAILY  Refills: 0     rOPINIRole  1 mg Tablet  Commonly known as: REQUIP    1 mg, Oral, 3 TIMES DAILY  Refills: 0     traMADoL  50 mg Tablet  Commonly known as: ULTRAM    50 mg, Oral, EVERY 6 HOURS PRN  Qty: 20 Tablet  Refills: 0     traZODone  100 mg Tablet  Commonly known as: DESYREL    100 mg, Oral, NIGHTLY  Refills: 0     Trulicity 0.75 mg/0.5 mL Pen Injector  Generic drug: dulaglutide   0.75 mg, Subcutaneous, EVERY 7 DAYS  Refills: 0            STOP taking these medications.      azithromycin  500 mg  Tablet  Commonly known as: ZITHROMAX      Banophen  50 mg Capsule  Generic drug: diphenhydrAMINE      clobetasoL 0.05 % Ointment  Commonly known as: TEMOVATE     ketorolac  tromethamine  10 mg Tablet  Commonly known as: TORADOL             ASK your doctor about these medications.        Details   chlorzoxazone  500 mg Tablet  Commonly known as: PARAFON  FORTE   500 mg, Oral, EVERY 6 HOURS PRN  Qty: 10 Tablet  Refills: 0     divalproex 250 mg Tablet Sustained Release 24 hr  Commonly known as: DEPAKOTE ER  250 mg  Refills: 0     furosemide 20 mg Tablet  Commonly known as: LASIX   20 mg, DAILY PRN  Refills: 0     ondansetron  4 mg Tablet, Rapid Dissolve  Commonly known as: ZOFRAN  ODT   4 mg, Oral, EVERY 8 HOURS PRN  Qty: 12 Tablet  Refills: 0     tiZANidine  4 mg Tablet  Commonly known as: ZANAFLEX    4 mg, Oral, 3 TIMES DAILY  Qty: 20 Tablet  Refills: 0     verapamiL 40 mg Tablet  Commonly known as: CALAN   1 Tablet, DAILY PRN  Refills: 0             DISCHARGE DISPOSITION:  Home    DISCHARGE INSTRUCTIONS:  No discharge procedures on file.   Follow-up Information       Tracie Niels LABOR, CNP Follow up.    Specialty: FAMILY NURSE PRACTITIONER  Contact information:  [address]  [place] [address]  [phone]               Moses Agent, MD Follow up.    Specialty: HEMATOLOGY-ONCOLOGY  Contact information:  [address]  [place] [address]  [phone]                                Copies sent to Care Team         Relationship Specialty Notifications Start End    Tracie Niels LABOR, [place] PCP - General FAMILY NURSE PRACTITIONER  04/28/22     Phone: [phone] Fax: [phone]         [address] [place] [address]            The hospitalist examined patient, reviewed material, and agreed with discharge at this time.   >30 minutes total were spent coordinating discharge day today    Zehava Turski Jess Meals, MD  [facility]   12/10/2023      This note was partially created using voice recognition software and is  inherently subject to errors including those of syntax and sound alike  substitutions which may escape proof reading. In such instances, original meaning may be extrapolated by contextual derivation.

## 2023-12-11 LAB — MONOCLONAL GAMMOPATHY PROFILE WITH IMMUNOTYPING REFLEX
ALBUMIN: 2.8 g/dL
KAPPA FREE LIGHT CHAINS: 2.59 mg/dL (ref 1.25–3.25)
KAPPA/LAMBDA FLC RATIO: 1.12 (ref 0.80–2.10)
LAMBDA FREE LIGHT CHAINS: 2.31 mg/dL (ref 0.60–2.70)
TOTAL PROTEIN: 5.5 g/dL

## 2023-12-11 LAB — HEP-2 SUBSTRATE ANTINUCLEAR ANTIBODIES (ANA), SERUM: ANA INTERPRETATION: NEGATIVE

## 2023-12-11 LAB — PROTEIN FOR ELECTROPHORESIS: PROTEIN TOTAL: 5.5 g/dL — ABNORMAL LOW (ref 5.6–7.6)

## 2023-12-11 LAB — ALBUMIN FOR ELECTROPHORESIS: ALBUMIN: 2.8 g/dL — ABNORMAL LOW (ref 3.4–4.8)

## 2023-12-11 LAB — KAPPA AND LAMBDA FREE LIGHT CHAINS, SERUM
KAPPA FREE LIGHT CHAINS: 2.59 mg/dL (ref 1.25–3.25)
KAPPA/LAMBDA FLC RATIO: 1.12 (ref 0.80–2.10)
LAMBDA FREE LIGHT CHAINS: 2.31 mg/dL (ref 0.60–2.70)

## 2023-12-11 LAB — RHEUMATOID FACTOR, SERUM: RHEUMATOID FACTOR: <13 [IU]/mL (ref <=30)

## 2023-12-12 DIAGNOSIS — D759 Disease of blood and blood-forming organs, unspecified: Secondary | ICD-10-CM

## 2023-12-12 LAB — FLOW CYTOMETRY, PERIPHERAL BLOOD: Viability (%): 99 %

## 2023-12-12 LAB — COPPER, SERUM: COPPER: 115 ug/dL (ref 70–175)

## 2023-12-15 ENCOUNTER — Emergency Department
Admission: EM | Admit: 2023-12-15 | Discharge: 2023-12-15 | Disposition: A | Discharge status: Home / Self Care | Payer: MEDICAID | Attending: NURSE PRACTITIONER | Admitting: NURSE PRACTITIONER

## 2023-12-15 ENCOUNTER — Emergency Department (HOSPITAL_COMMUNITY): Payer: MEDICAID

## 2023-12-15 ENCOUNTER — Other Ambulatory Visit: Payer: Self-pay

## 2023-12-15 DIAGNOSIS — Z90711 Acquired absence of uterus with remaining cervical stump: Secondary | ICD-10-CM | POA: Insufficient documentation

## 2023-12-15 DIAGNOSIS — R1909 Other intra-abdominal and pelvic swelling, mass and lump: Secondary | ICD-10-CM

## 2023-12-15 DIAGNOSIS — K529 Noninfective gastroenteritis and colitis, unspecified: Secondary | ICD-10-CM

## 2023-12-15 DIAGNOSIS — K3184 Gastroparesis: Secondary | ICD-10-CM | POA: Insufficient documentation

## 2023-12-15 DIAGNOSIS — N839 Noninflammatory disorder of ovary, fallopian tube and broad ligament, unspecified: Secondary | ICD-10-CM | POA: Insufficient documentation

## 2023-12-15 DIAGNOSIS — N9489 Other specified conditions associated with female genital organs and menstrual cycle: Secondary | ICD-10-CM

## 2023-12-15 DIAGNOSIS — R109 Unspecified abdominal pain: Secondary | ICD-10-CM

## 2023-12-15 DIAGNOSIS — Z903 Acquired absence of stomach [part of]: Secondary | ICD-10-CM | POA: Insufficient documentation

## 2023-12-15 DIAGNOSIS — K5900 Constipation, unspecified: Secondary | ICD-10-CM

## 2023-12-15 DIAGNOSIS — N939 Abnormal uterine and vaginal bleeding, unspecified: Secondary | ICD-10-CM | POA: Insufficient documentation

## 2023-12-15 LAB — URINALYSIS, MACROSCOPIC
BILIRUBIN: NEGATIVE mg/dL
BLOOD: NEGATIVE mg/dL
GLUCOSE: NEGATIVE mg/dL
KETONES: NEGATIVE mg/dL
LEUKOCYTES: 250 WBCs/uL — AB
NITRITE: NEGATIVE
PH: 7 (ref 5.0–9.0)
PROTEIN: NEGATIVE mg/dL
SPECIFIC GRAVITY: 1.01 (ref 1.002–1.030)
UROBILINOGEN: NORMAL mg/dL

## 2023-12-15 LAB — COMPREHENSIVE METABOLIC PANEL, NON-FASTING
ALBUMIN/GLOBULIN RATIO: 1.1 (ref 0.8–1.4)
ALBUMIN: 3.3 g/dL — ABNORMAL LOW (ref 3.5–5.7)
ALKALINE PHOSPHATASE: 77 U/L (ref 34–104)
ALT (SGPT): 75 U/L — ABNORMAL HIGH (ref 7–52)
ANION GAP: 5 mmol/L (ref 4–13)
AST (SGOT): 65 U/L — ABNORMAL HIGH (ref 13–39)
BILIRUBIN TOTAL: 0.2 mg/dL — ABNORMAL LOW (ref 0.3–1.0)
BUN/CREA RATIO: 20 (ref 6–22)
BUN: 18 mg/dL (ref 7–25)
CALCIUM, CORRECTED: 9.2 mg/dL (ref 8.9–10.8)
CALCIUM: 8.6 mg/dL (ref 8.6–10.3)
CHLORIDE: 108 mmol/L — ABNORMAL HIGH (ref 98–107)
CO2 TOTAL: 27 mmol/L (ref 21–31)
CREATININE: 0.92 mg/dL (ref 0.60–1.30)
ESTIMATED GFR: 71 mL/min/1.73mˆ2 (ref >59)
GLOBULIN: 2.9 (ref 2.0–3.5)
GLUCOSE: 87 mg/dL (ref 74–109)
OSMOLALITY, CALCULATED: 281 mosm/kg (ref 270–290)
POTASSIUM: 4.2 mmol/L (ref 3.5–5.1)
PROTEIN TOTAL: 6.2 g/dL — ABNORMAL LOW (ref 6.4–8.9)
SODIUM: 140 mmol/L (ref 136–145)

## 2023-12-15 LAB — URINALYSIS, MICROSCOPIC
BACTERIA: NEGATIVE /HPF
RBCS: 1 /HPF (ref <4)
SQUAMOUS EPITHELIAL: <1 /HPF (ref <28)
WBCS: 6 /HPF — ABNORMAL HIGH (ref <6)

## 2023-12-15 LAB — DRUG SCREEN, NO CONFIRMATION, URINE
AMPHETAMINES URINE: NEGATIVE
BARBITURATES URINE: NEGATIVE
BENZODIAZEPINES URINE: NEGATIVE
BUPRENORPHINE URINE: NEGATIVE
CANNABINOIDS URINE: NEGATIVE
COCAINE METABOLITES URINE: NEGATIVE
FENTANYL, URINE: NEGATIVE
METHADONE URINE: NEGATIVE
OPIATES URINE: NEGATIVE
OXYCODONE URINE: NEGATIVE
PCP URINE: NEGATIVE

## 2023-12-15 LAB — CBC WITH DIFF
BASOPHIL #: 0 x10ˆ3/uL (ref 0.00–0.10)
BASOPHIL %: 1 % (ref 0–1)
EOSINOPHIL #: 0.2 x10ˆ3/uL (ref 0.00–0.50)
EOSINOPHIL %: 4 % (ref 1–7)
HCT: 24.3 % — ABNORMAL LOW (ref 31.2–41.9)
HGB: 8 g/dL — ABNORMAL LOW (ref 10.9–14.3)
LYMPHOCYTE #: 0.8 x10ˆ3/uL — ABNORMAL LOW (ref 1.10–3.10)
LYMPHOCYTE %: 20 % (ref 16–46)
MCH: 24.3 pg — ABNORMAL LOW (ref 24.7–32.8)
MCHC: 32.9 g/dL (ref 32.3–35.6)
MCV: 73.7 fL — ABNORMAL LOW (ref 75.5–95.3)
MONOCYTE #: 0.5 x10ˆ3/uL (ref 0.20–0.90)
MONOCYTE %: 13 % — ABNORMAL HIGH (ref 4–11)
MPV: 7.1 fL — ABNORMAL LOW (ref 7.9–10.8)
NEUTROPHIL #: 2.5 x10ˆ3/uL (ref 1.90–8.20)
NEUTROPHIL %: 63 % (ref 43–77)
PLATELETS: 275 x10ˆ3/uL (ref 140–440)
RBC: 3.3 x10ˆ6/uL — ABNORMAL LOW (ref 3.63–4.92)
RDW: 16.7 % (ref 12.3–17.7)
WBC: 4 x10ˆ3/uL (ref 3.8–11.8)

## 2023-12-15 LAB — LIPASE: LIPASE: 10 U/L — ABNORMAL LOW (ref 11–82)

## 2023-12-15 MED ORDER — HEPARIN, PORCINE (PF) 100 UNIT/ML INTRAVENOUS SYRINGE
INJECTION | INTRAVENOUS | Status: AC
Start: 2023-12-15 — End: 2023-12-15
  Filled 2023-12-15: qty 5

## 2023-12-15 MED ORDER — IOPAMIDOL 370 MG IODINE/ML (76 %) INTRAVENOUS SOLUTION
75.0000 mL | INTRAVENOUS | Status: AC
Start: 2023-12-15 — End: 2023-12-15
  Administered 2023-12-15: 75 mL via INTRAVENOUS

## 2023-12-15 MED ORDER — TRAMADOL 50 MG TABLET
ORAL_TABLET | ORAL | Status: AC
Start: 2023-12-15 — End: 2023-12-15
  Filled 2023-12-15: qty 2

## 2023-12-15 MED ORDER — TRAMADOL 50 MG TABLET
100.0000 mg | ORAL_TABLET | ORAL | Status: AC
Start: 2023-12-15 — End: 2023-12-15
  Administered 2023-12-15: 100 mg via ORAL

## 2023-12-15 MED ORDER — KETOROLAC 30 MG/ML (1 ML) INJECTION SOLUTION
INTRAMUSCULAR | Status: AC
Start: 2023-12-15 — End: 2023-12-15
  Filled 2023-12-15: qty 1

## 2023-12-15 MED ORDER — PROCHLORPERAZINE EDISYLATE 10 MG/2 ML (5 MG/ML) INJECTION SOLUTION
INTRAMUSCULAR | Status: AC
Start: 2023-12-15 — End: 2023-12-15
  Filled 2023-12-15: qty 2

## 2023-12-15 MED ORDER — LACTULOSE 10 GRAM/15 ML ORAL SOLUTION
ORAL | Status: AC
Start: 2023-12-15 — End: 2023-12-15
  Filled 2023-12-15: qty 30

## 2023-12-15 MED ORDER — LACTULOSE 10 GRAM/15 ML ORAL SOLUTION
30.0000 mL | ORAL | Status: AC
Start: 2023-12-15 — End: 2023-12-15
  Administered 2023-12-15: 30 mL via ORAL

## 2023-12-15 MED ORDER — KETOROLAC 30 MG/ML (1 ML) INJECTION SOLUTION
30.0000 mg | INTRAMUSCULAR | Status: AC
Start: 2023-12-15 — End: 2023-12-15
  Administered 2023-12-15: 30 mg via INTRAVENOUS

## 2023-12-15 MED ORDER — PROCHLORPERAZINE EDISYLATE 10 MG/2 ML (5 MG/ML) INJECTION SOLUTION
10.0000 mg | INTRAMUSCULAR | Status: AC
Start: 2023-12-15 — End: 2023-12-15
  Administered 2023-12-15: 10 mg via INTRAVENOUS

## 2023-12-15 NOTE — ED Provider Notes (Signed)
 [facility]  ED Primary Provider Note  Patient Name: Martha White  Patient Age: 58 y.o.  Date of Birth: Jun 17, 1965    Chief Complaint: Abdominal Pain        History of Present Illness       Martha White is a 58 y.o. female who had concerns including Abdominal Pain.     History of Present Illness  Martha White is a 58 year old female with gastroparesis who presents with worsening abdominal pain.    She experiences severe right lower quadrant abdominal pain that is sudden and intense, lasting 15 to 20 seconds before subsiding. This pain is new and unlike any previous experiences.    Her medical history includes gastroparesis with 95% of her stomach removed. Previous diagnostic workup included an incomplete colonoscopy due to inadequate preparation, a CT scan showing widening and swelling of the right colon wall, and an AGD revealing five small bleeding ulcers.    She reports vaginal bleeding despite having had a partial hysterectomy, leaving her with only her ovaries and cervix. She has not seen an OBGYN in six to seven years due to her previous doctors retiring.    She also experiences swelling in her legs, which is not new but has become more intense.       Review of Systems     No other overt Review of Systems are noted to be positive except noted in the HPI.      Historical Data   History Reviewed This Encounter: Medical History  Surgical History  Family History  Social History      Physical Exam   ED Triage Vitals [12/15/23 0852]   BP (Non-Invasive) 125/80   Heart Rate (!) 101   Respiratory Rate 18   Temperature 36.3 C (97.3 F)   SpO2 100 %   Weight 79.4 kg (175 lb)   Height 1.253 m (4' 1.32)         Nursing notes reviewed for what could be assessed. Past Medical, Surgical, and Social history reviewed for what has been completed.     Constitutional: NAD. Well-Developed. Well Nourished.  Head: Normocephalic, atraumatic.  Mouth/Throat:  Moist mucous membranes.  Eyes: EOM  grossly intact, conjunctiva normal.  Neck: Supple  Cardiovascular: Regular Rate and Rhythm, extremities well perfused.  Pulmonary/Chest: No respiratory distress. Lungs are symmetric to auscultation bilaterally.    Abdominal: Soft, non-tender, non-distended. Non peritoneal, no rebound, no guarding.  MSK: No Lower Extremity Edema.  Skin: Warm, dry, and intact  Neuro: Appropriate, CN II-XII grossly intact. Gait at patient's baseline  Psych: Pleasant             Procedures      Patient Data     Labs Ordered/Reviewed   COMPREHENSIVE METABOLIC PANEL, NON-FASTING - Abnormal; Notable for the following components:       Result Value    CHLORIDE 108 (*)     ALBUMIN 3.3 (*)     ALT (SGPT) 75 (*)     AST (SGOT) 65 (*)     BILIRUBIN TOTAL 0.2 (*)     PROTEIN TOTAL 6.2 (*)     All other components within normal limits    Narrative:     Estimated Glomerular Filtration Rate (eGFR) is calculated using the CKD-EPI (2021) equation, intended for patients 27 years of age and older. If gender is not documented or unknown, there will be no eGFR calculation.     LIPASE - Abnormal; Notable for  the following components:    LIPASE 10 (*)     All other components within normal limits   CBC WITH DIFF - Abnormal; Notable for the following components:    RBC 3.30 (*)     HGB 8.0 (*)     HCT 24.3 (*)     MCV 73.7 (*)     MCH 24.3 (*)     MPV 7.1 (*)     MONOCYTE % 13 (*)     LYMPHOCYTE # 0.80 (*)     All other components within normal limits   URINALYSIS, MACROSCOPIC - Abnormal; Notable for the following components:    LEUKOCYTES 250 (*)     All other components within normal limits   URINALYSIS, MICROSCOPIC - Abnormal; Notable for the following components:    WBCS 6 (*)     All other components within normal limits   CBC/DIFF    Narrative:     The following orders were created for panel order CBC/DIFF.  Procedure                               Abnormality         Status                     ---------                               -----------          ------                     CBC WITH DIFF[[id number]]                Abnormal            Final result                 Please view results for these tests on the individual orders.   URINALYSIS, MACROSCOPIC AND MICROSCOPIC W/CULTURE REFLEX    Narrative:     The following orders were created for panel order URINALYSIS, MACROSCOPIC AND MICROSCOPIC W/CULTURE REFLEX.  Procedure                               Abnormality         Status                     ---------                               -----------         ------                     URINALYSIS, MACROSCOPIC[[id number]]      Abnormal            Final result               URINALYSIS, MICROSCOPIC[[id number]]      Abnormal            Final result                 Please view results for these tests on the individual orders.       CT ABDOMEN PELVIS W IV  CONTRAST   Final Result by Edi, Radresults In (09/28 1336)   The appearance of the small bowel could indicate mild enteritis in the appropriate clinical setting   Fecal retention in the colon   No ascites or free air   Stable postop changes around the stomach.   Incidental findings as detailed above.               Radiologist location ID: [id number]             Medical Decision Making          Medical Decision Making  Amount and/or Complexity of Data Reviewed  Labs: ordered.  Radiology: ordered. Decision-making details documented in ED Course.  ECG/medicine tests: independent interpretation performed.    Risk  Prescription drug management.          Studies Assessed:  Labs, imaging      MDM Narrative:      Medical Decision Making  A 58 year old female with a history of partial hysterectomy (retained ovaries and cervix), gastrectomy for gastroparesis, and chronic lower extremity swelling presented with worsening right lower quadrant abdominal pain, new vaginal bleeding, and increased lower extremity swelling and paresthesia. The abdominal pain is severe, sudden in onset, and lasts 15-20 seconds. She denies nausea or  vomiting. Exam findings and labs are not provided.    Patient has been admitted in the past for abdominal pain.  Patient states her symptoms returned.  It does appear that patient did have a EGD during that time.  Patient's cause keep her up was not satisfactory.  Patient's workup unremarkable.  I did offer patient a transvaginal ultrasound and she declined offered patient has a vaginal exam as well and she declined.  I did have to wake the patient up from sleep and went into her room.  Also reviewed the patient's PDMP and it looks like she gets Ultram  and Lyrica  and may occasional hydrocodone  from her PCP.  Her urine drug screen today was negative.    Differential diagnosis includes, but is not limited to:  - Colonic pathology (including ulcers or other colonic disease): Acute right lower quadrant abdominal pain with prior imaging showing right colon wall swelling and history of colonic ulcers raises concern for colonic pathology.  - Electrolyte imbalance: Chronic lower extremity swelling and paresthesia may be due to electrolyte disturbances such as hypomagnesemia or hypokalemia.  - Genitourinary or vaginal source of bleeding: New vaginal bleeding after partial hysterectomy with retained cervix and ovaries may originate from the vaginal canal or urinary tract, as there is no uterus.    Right lower quadrant abdominal pain, lower extremity swelling/paresthesia, and vaginal bleeding  - Order CT scan of the abdomen with contrast  - Order imaging to evaluate source of bleeding  - Administer Compazine  for nausea                      ED Course as of 12/16/23 1601   Sun Dec 15, 2023   1355 CT ABDOMEN PELVIS W IV CONTRAST  IMPRESSION:  The appearance of the small bowel could indicate mild enteritis in the appropriate clinical setting  Fecal retention in the colon  No ascites or free air  Stable postop changes around the stomach.  Incidental findings as detailed above.     1404 HGB(!): 8.0  Improvement from previous    1417  I had to wake the patient from sleep upon entering her room to inform her of her CT results.  Informed patient that there was no acute abnormalities of the bowel that she likely has a enteritis and some constipation.  The CT scan also showed some suspicious adnexal masses I did offer the patient an ultrasound as well as a vaginal exam and patient declined.  When I informed the patient that I would medicate her with the oral tramadol  she states that takes too long to kick in.  Patient refused any further workup and states she is ready to go home.         Medications Ordered/Administered in the ED   prochlorperazine  (COMPAZINE ) 5 mg/mL injection (10 mg Intravenous Given 12/15/23 1029)   ketorolac  (TORADOL ) 30 mg/mL injection (30 mg Intravenous Given 12/15/23 1030)   iopamidol (ISOVUE-370) 76% infusion (75 mL Intravenous Given 12/15/23 1321)   traMADol  (ULTRAM ) tablet (100 mg Oral Given 12/15/23 1414)   lactulose (ENULOSE) 10g per 15mL oral liquid (30 mL Oral Given 12/15/23 1414)       Following the history, physical exam, and ED workup, the patient was deemed stable and suitable for discharge. The patient/caregiver was advised to return to the ED for any new or worsening symptoms. Discharge medications, and follow-up instructions were discussed with the patient/caregiver in detail, who verbalizes understanding. The patient/caregiver is in agreement and is comfortable with the plan of care.    Disposition: Discharged         Current Discharge Medication List        CONTINUE these medications - NO CHANGES were made during your visit.        Details   albuterol  sulfate 90 mcg/actuation oral inhaler  Commonly known as: PROVENTIL  or VENTOLIN  or PROAIR    1-2 Puffs, Inhalation, EVERY 6 HOURS PRN  Refills: 0     amLODIPine  10 mg Tablet  Commonly known as: NORVASC    10 mg, Oral, Daily  Refills: 0     aspirin  81 mg Tablet, Delayed Release (E.C.)  Commonly known as: ECOTRIN   81 mg, Oral, Daily  Refills: 0     ATORVASTATIN  ORAL    80 mg, Oral, Daily  Refills: 0     bethanechol chloride 10 mg Tablet  Commonly known as: URECHOLINE   5 mg, Oral, Daily  Refills: 0     chlorzoxazone  500 mg Tablet  Commonly known as: PARAFON  FORTE   500 mg, Oral, EVERY 6 HOURS PRN  Qty: 10 Tablet  Refills: 0     clopidogreL 75 mg Tablet  Commonly known as: PLAVIX   75 mg, Oral, Daily  Refills: 0     COMBIVENT INHL   1 Puff, Inhalation, 4 TIMES DAILY PRN  Refills: 0     cyanocobalamin 1,000 mcg/mL Solution  Commonly known as: VITAMIN B12   Inject 1 mL (1,000 mcg total) under the skin Every 14 days  Refills: 0     cyclobenzaprine  10 mg Tablet  Commonly known as: FLEXERIL    5 mg, Oral, 3 TIMES DAILY  Refills: 0     divalproex 250 mg Tablet Sustained Release 24 hr  Commonly known as: DEPAKOTE ER   250 mg  Refills: 0     docusate sodium 100 mg Capsule  Commonly known as: COLACE   100 mg by oral route.  Refills: 0     DULoxetine  30 mg Capsule, Delayed Release(E.C.)  Commonly known as: CYMBALTA  DR   30 mg, Oral, 2 TIMES DAILY  Refills: 0     ergocalciferol  (vitamin D2) 1,250 mcg (50,000 unit) Capsule  Commonly  known as: DRISDOL   50,000 Units, Oral, EVERY 7 DAYS  Refills: 0     famotidine  40 mg Tablet  Commonly known as: PEPCID    40 mg, Oral, 2 TIMES DAILY  Qty: 60 Tablet  Refills: 0     furosemide 20 mg Tablet  Commonly known as: LASIX   20 mg, DAILY PRN  Refills: 0     gabapentin 400 mg Capsule  Commonly known as: NEURONTIN   400 mg, Oral, 3 TIMES DAILY  Refills: 0     HUMALOG  KWIKPEN INSULIN  SUBQ   Subcutaneous, 7-20 units tid before meals  Refills: 0     hydrOXYzine pamoate 25 mg Capsule  Commonly known as: VISTARIL   25 mg, Oral, NIGHTLY  Refills: 0     Levemir FlexPen 100 unit/mL (3 mL) Insulin  Pen  Generic drug: insulin  detemir U-100   60 Units, Subcutaneous, DAILY PRN, (SLIDING SCALE)  Refills: 0     lisinopriL 20 mg Tablet  Commonly known as: PRINIVIL   20 mg, Oral, Daily  Refills: 0     loratadine 10 mg Tablet  Commonly known as: CLARITIN   10 mg, Oral, DAILY  PRN  Refills: 0     magnesium  chloride 64 mg Tablet, Delayed Release (E.C.)  Commonly known as: SLOW-MAG   64 mg, Daily  Refills: 0     MetFORMIN 1,000 mg Tablet  Commonly known as: GLUCOPHAGE   1,000 mg, Oral, EVERY MORNING WITH BREAKFAST  Refills: 0     metoclopramide  100 mcg/mL Solution  Commonly known as: REGLAN    5 mL, Oral, 4 TIMES DAILY - BEFORE MEALS AND NIGHTLY  Refills: 0     metoprolol  succinate 50 mg Tablet Sustained Release 24 hr  Commonly known as: TOPROL -XL   50 mg, Oral, Daily  Refills: 0     omeprazole 40 mg Capsule, Delayed Release(E.C.)  Commonly known as: PRILOSEC   40 mg, Oral, Daily  Refills: 0     ondansetron  4 mg Tablet, Rapid Dissolve  Commonly known as: ZOFRAN  ODT   4 mg, Oral, EVERY 8 HOURS PRN  Qty: 12 Tablet  Refills: 0     pregabalin  50 mg Capsule  Commonly known as: LYRICA    50 mg, Oral, NIGHTLY  Qty: 30 Capsule  Refills: 0     promethazine  25 mg Tablet  Commonly known as: PHENERGAN    25 mg, EVERY 4 HOURS PRN  Refills: 0     QUEtiapine  50 mg Tablet  Commonly known as: SEROquel    50 mg, Oral, NIGHTLY  Qty: 30 Tablet  Refills: 0     rifAXIMin 550 mg Tablet  Commonly known as: XIFAXAN   550 mg, 3 TIMES DAILY  Refills: 0     rOPINIRole  1 mg Tablet  Commonly known as: REQUIP    1 mg, Oral, 3 TIMES DAILY  Refills: 0     tiZANidine  4 mg Tablet  Commonly known as: ZANAFLEX    4 mg, Oral, 3 TIMES DAILY  Qty: 20 Tablet  Refills: 0     traMADoL  50 mg Tablet  Commonly known as: ULTRAM    50 mg, Oral, EVERY 6 HOURS PRN  Qty: 20 Tablet  Refills: 0     traZODone  100 mg Tablet  Commonly known as: DESYREL    100 mg, Oral, NIGHTLY  Refills: 0     Trulicity 0.75 mg/0.5 mL Pen Injector  Generic drug: dulaglutide   0.75 mg, Subcutaneous, EVERY 7 DAYS  Refills: 0     verapamiL  40 mg Tablet  Commonly known as: CALAN   1 Tablet, DAILY PRN  Refills: 0            Follow up:   Juli Lucie DASEN, DO  [address]  [place] [address]  [phone]          Tracie Niels LABOR, CNP  [address]  [place]  [address]  [phone]                    Clinical Impression   Abdominal pain, unspecified abdominal location (Primary)   Enteritis   Constipation, unspecified constipation type   Adnexal mass         Discharge Medication List as of 12/15/2023  2:25 PM              P. Jason FNP-C  Department of Emergency Medicine

## 2023-12-15 NOTE — ED Triage Notes (Signed)
 Abd pain , vaginal bleeding states recently had iron  infusion now having all the side effects from it that where listed on discharge

## 2023-12-15 NOTE — Discharge Instructions (Addendum)
 Thank you for allowing [place] to be part of your care.    Please discuss all medications with your pharmacist to ensure there are no concerns of interactions.    Please ensure all questions or concerns are addressed prior to leaving the hospital. We want to make sure your concerns are addressed to make sure you are as safe and healthy as possible. By leaving the hospital, it is understood you are in agreement with your treatment plan.    You may have received sedating medication during your visit. Please discuss this with your discharging provider nurse as you may not be able to operate machines while the medication is in your system, or while you are taking any potentially sedating prescriptions.    Please call the hospital medical records office for a copy of your finalized results, and review them with a primary care physician, for any findings needing further attention.    If you feel your situation worsens, or does not get better in 48 hours, please see a physician for evaluation.    We encourage you to see your regular doctor as soon as possible to let them know you were seen in the emergency department. They may want to do further testing. If you do not have a doctor, please feel free to call the hospital, and ask for contact information of accepting providers. Please also discuss your vaccinations, and ensure all are up to date.    You may use this document to take today off work or school.

## 2023-12-16 ENCOUNTER — Encounter (HOSPITAL_COMMUNITY): Payer: Self-pay | Admitting: NURSE PRACTITIONER

## 2023-12-17 ENCOUNTER — Ambulatory Visit (HOSPITAL_BASED_OUTPATIENT_CLINIC_OR_DEPARTMENT_OTHER): Payer: Self-pay | Admitting: Family

## 2023-12-17 LAB — URINE CULTURE,ROUTINE: URINE CULTURE: 20000 — AB

## 2023-12-19 ENCOUNTER — Other Ambulatory Visit (INDEPENDENT_AMBULATORY_CARE_PROVIDER_SITE_OTHER): Payer: Self-pay | Admitting: NURSE PRACTITIONER

## 2023-12-19 ENCOUNTER — Ambulatory Visit (HOSPITAL_COMMUNITY)
Admission: RE | Admit: 2023-12-19 | Discharge: 2023-12-19 | Disposition: A | Discharge status: Home / Self Care | Source: Ambulatory Visit | Attending: NURSE PRACTITIONER | Admitting: NURSE PRACTITIONER

## 2023-12-19 ENCOUNTER — Encounter (INDEPENDENT_AMBULATORY_CARE_PROVIDER_SITE_OTHER): Payer: Self-pay | Admitting: NURSE PRACTITIONER

## 2023-12-19 ENCOUNTER — Other Ambulatory Visit: Payer: Self-pay

## 2023-12-19 ENCOUNTER — Ambulatory Visit (INDEPENDENT_AMBULATORY_CARE_PROVIDER_SITE_OTHER): Payer: Self-pay

## 2023-12-19 ENCOUNTER — Encounter (HOSPITAL_COMMUNITY): Payer: Self-pay | Admitting: NURSE PRACTITIONER

## 2023-12-19 ENCOUNTER — Other Ambulatory Visit (HOSPITAL_COMMUNITY): Payer: Self-pay | Admitting: NURSE PRACTITIONER

## 2023-12-19 ENCOUNTER — Ambulatory Visit
Admission: RE | Admit: 2023-12-19 | Discharge: 2023-12-19 | Disposition: A | Discharge status: Home / Self Care | Source: Ambulatory Visit | Attending: NURSE PRACTITIONER | Admitting: NURSE PRACTITIONER

## 2023-12-19 ENCOUNTER — Ambulatory Visit (HOSPITAL_BASED_OUTPATIENT_CLINIC_OR_DEPARTMENT_OTHER): Payer: Self-pay | Admitting: NURSE PRACTITIONER

## 2023-12-19 VITALS — BP 142/93 | HR 81 | Temp 97.1°F | Resp 18

## 2023-12-19 VITALS — BP 128/79 | HR 95 | Temp 97.4°F

## 2023-12-19 DIAGNOSIS — R112 Nausea with vomiting, unspecified: Secondary | ICD-10-CM | POA: Insufficient documentation

## 2023-12-19 DIAGNOSIS — R1110 Vomiting, unspecified: Secondary | ICD-10-CM | POA: Insufficient documentation

## 2023-12-19 DIAGNOSIS — R197 Diarrhea, unspecified: Secondary | ICD-10-CM

## 2023-12-19 DIAGNOSIS — Z9889 Other specified postprocedural states: Secondary | ICD-10-CM | POA: Insufficient documentation

## 2023-12-19 DIAGNOSIS — Z9884 Bariatric surgery status: Secondary | ICD-10-CM | POA: Insufficient documentation

## 2023-12-19 DIAGNOSIS — Z79899 Other long term (current) drug therapy: Secondary | ICD-10-CM | POA: Insufficient documentation

## 2023-12-19 DIAGNOSIS — D509 Iron deficiency anemia, unspecified: Secondary | ICD-10-CM | POA: Insufficient documentation

## 2023-12-19 DIAGNOSIS — D649 Anemia, unspecified: Secondary | ICD-10-CM

## 2023-12-19 DIAGNOSIS — G629 Polyneuropathy, unspecified: Secondary | ICD-10-CM | POA: Insufficient documentation

## 2023-12-19 DIAGNOSIS — E46 Unspecified protein-calorie malnutrition: Secondary | ICD-10-CM | POA: Insufficient documentation

## 2023-12-19 DIAGNOSIS — K3184 Gastroparesis: Secondary | ICD-10-CM | POA: Insufficient documentation

## 2023-12-19 DIAGNOSIS — E860 Dehydration: Secondary | ICD-10-CM | POA: Insufficient documentation

## 2023-12-19 DIAGNOSIS — G2581 Restless legs syndrome: Secondary | ICD-10-CM | POA: Insufficient documentation

## 2023-12-19 DIAGNOSIS — R5383 Other fatigue: Secondary | ICD-10-CM | POA: Insufficient documentation

## 2023-12-19 DIAGNOSIS — F5104 Psychophysiologic insomnia: Secondary | ICD-10-CM | POA: Insufficient documentation

## 2023-12-19 LAB — IRON TRANSFERRIN AND TIBC
IRON (TRANSFERRIN) SATURATION: 14 % — ABNORMAL LOW (ref 15–50)
IRON: 57 ug/dL (ref 50–212)
TOTAL IRON BINDING CAPACITY: 398 ug/dL (ref 250–450)
TRANSFERRIN: 284 mg/dL (ref 203–362)
UIBC: 341 ug/dL (ref 130–375)

## 2023-12-19 LAB — PHOSPHORUS: PHOSPHORUS: 2.8 mg/dL — ABNORMAL LOW (ref 3.7–7.2)

## 2023-12-19 LAB — CBC
HCT: 27 % — ABNORMAL LOW (ref 31.2–41.9)
HGB: 8.7 g/dL — ABNORMAL LOW (ref 10.9–14.3)
MCH: 24.2 pg — ABNORMAL LOW (ref 24.7–32.8)
MCHC: 32.3 g/dL (ref 32.3–35.6)
MCV: 75 fL — ABNORMAL LOW (ref 75.5–95.3)
MPV: 7.3 fL — ABNORMAL LOW (ref 7.9–10.8)
PLATELETS: 235 x10ˆ3/uL (ref 140–440)
RBC: 3.6 x10ˆ6/uL — ABNORMAL LOW (ref 3.63–4.92)
RDW: 18.5 % — ABNORMAL HIGH (ref 12.3–17.7)
WBC: 3.1 x10ˆ3/uL — ABNORMAL LOW (ref 3.8–11.8)

## 2023-12-19 LAB — MAGNESIUM: MAGNESIUM: 1.9 mg/dL (ref 1.9–2.7)

## 2023-12-19 LAB — COMPREHENSIVE METABOLIC PANEL, NON-FASTING
ALBUMIN/GLOBULIN RATIO: 1.2 (ref 0.8–1.4)
ALBUMIN: 3.5 g/dL (ref 3.5–5.7)
ALKALINE PHOSPHATASE: 77 U/L (ref 34–104)
ALT (SGPT): 50 U/L (ref 7–52)
ANION GAP: 6 mmol/L (ref 4–13)
AST (SGOT): 40 U/L — ABNORMAL HIGH (ref 13–39)
BILIRUBIN TOTAL: 0.2 mg/dL — ABNORMAL LOW (ref 0.3–1.0)
BUN/CREA RATIO: 17 (ref 6–22)
BUN: 16 mg/dL (ref 7–25)
CALCIUM, CORRECTED: 9.3 mg/dL (ref 8.9–10.8)
CALCIUM: 8.9 mg/dL (ref 8.6–10.3)
CHLORIDE: 111 mmol/L — ABNORMAL HIGH (ref 98–107)
CO2 TOTAL: 22 mmol/L (ref 21–31)
CREATININE: 0.95 mg/dL (ref 0.60–1.30)
ESTIMATED GFR: 69 mL/min/1.73mˆ2 (ref >59)
GLOBULIN: 3 (ref 2.0–3.5)
GLUCOSE: 77 mg/dL (ref 74–109)
OSMOLALITY, CALCULATED: 278 mosm/kg (ref 270–290)
POTASSIUM: 4.1 mmol/L (ref 3.5–5.1)
PROTEIN TOTAL: 6.5 g/dL (ref 6.4–8.9)
SODIUM: 139 mmol/L (ref 136–145)

## 2023-12-19 LAB — FERRITIN: FERRITIN: 192 ng/mL (ref 11–307)

## 2023-12-19 LAB — VITAMIN B12: VITAMIN B 12: 430 pg/mL (ref 180–914)

## 2023-12-19 MED ORDER — PROCHLORPERAZINE MALEATE 10 MG TABLET
10.0000 mg | ORAL_TABLET | Freq: Four times a day (QID) | ORAL | 3 refills | Status: DC | PRN
Start: 2023-12-19 — End: 2024-01-03

## 2023-12-19 MED ORDER — LIDOCAINE-PRILOCAINE 2.5 %-2.5 % TOPICAL CREAM
TOPICAL_CREAM | CUTANEOUS | 3 refills | Status: AC
Start: 2023-12-19 — End: ?

## 2023-12-19 MED ORDER — SODIUM CHLORIDE 0.9 % IV BOLUS
1000.0000 mL | INJECTION | Freq: Once | Status: AC
Start: 2023-12-19 — End: 2023-12-19
  Administered 2023-12-19: 0 mL via INTRAVENOUS
  Administered 2023-12-19: 1000 mL via INTRAVENOUS

## 2023-12-19 NOTE — H&P (Signed)
 Department of Hematology/Oncology  History and Physical    Name: Martha White  FMW:[id number]  Date of Birth: 01/03/1966  Encounter Date: 12/19/2023    REFERRING PROVIDER:  Lendia Riesa Skiff, MD  [address]  [place],  [place] [address]    PCP:  Eleanor JONELLE Lee, FNP  [address]  [place] [address]         REASON FOR OFFICE VISIT:  New patient for evaluation and management of  anemia      HISTORY OF PRESENT ILLNESS:    History of Present Illness  Martha White is a 58 year old female with a history of gastrectomy and gastric bypass who presents with iron  deficiency anemia.    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, followed by gastric bypass, leaving 5% of stomach remaining  - Five prior bleeding ulcers and a punctured colon from previous surgery  - Chronic diarrhea for the past 1.5 years  - Frequent vomiting with inability to retain food or liquids, contributing to dehydration and malnutrition  - Difficulty with bowel preparation for colonoscopy due to impaction and persistent diarrhea    Neuropathic and musculoskeletal pain  - Severe leg pain described as 'a billion ants' inside her legs, unrelieved by heat or rubbing  - Muscle cramps and swelling in legs and feet, associated with low iron  levels  - Diagnosed with neuropathy and fibromyalgia    Sleep disturbances and neurocognitive symptoms  - Significant insomnia and daytime sleepiness  - Minimal relief from Seroquel  prescribed for sleep  -  Constant pain, particularly in legs and head, contributing to inability to sleep  - Visual disturbances in left eye  - Memory impairment    Glycemic instability  - Unstable blood sugar levels, with episodes as low as 57 mg/dL  - Consumption of sugary foods causes rapid increase followed by quick drop in blood sugar  - Frequent diarrhea and vomiting complicate ability to maintain stable blood glucose           History:  Past Medical History:   Diagnosis Date    Arthropathy     Asthma     Carpal tunnel syndrome     Carpal tunnel syndrome     Congestive heart failure     COPD (chronic obstructive pulmonary disease)     Depression     Diabetes mellitus, type 2     Fibromyalgia     Gastroparesis     Headache     HTN (hypertension)     Lumbar herniated disc     Neuropathy (CMS HCC)     Restless legs syndrome (RLS)     Thyroid  disease     TIA (transient ischemic attack)     TIA (transient ischemic attack)            Past Surgical History:   Procedure Laterality Date    HX CHOLECYSTECTOMY      HX HERNIA REPAIR      HX HERNIA REPAIR  HX HYSTERECTOMY      HX ROTATOR CUFF REPAIR      STOMACH SURGERY      X4           Social History     Socioeconomic History    Marital status: Married     Spouse name: Not on file    Number of children: Not on file    Years of education: Not on file    Highest education level: Not on file   Occupational History    Not on file   Tobacco Use    Smoking status: Never    Smokeless tobacco: Never   Vaping Use    Vaping status: Never Used   Substance and Sexual Activity    Alcohol  use: Never    Drug use: Never    Sexual activity: Never   Other Topics Concern    Not on file   Social History Narrative    Not on file     Social Determinants of Health     Financial Resource Strain: Not on file   Transportation Needs: Not on file   Social Connections: Medium Risk (12/07/2023)    Social Connections     SDOH Social Isolation: 3 to 5 times a week   Intimate Partner Violence: Not on file   Housing  Stability: Not on file       Social History     Social History Narrative    Not on file       Social History     Substance and Sexual Activity   Drug Use Never       Family Medical History:       Problem Relation (Age of Onset)    Alzheimer's/Dementia Maternal Grandmother    Heart Attack Maternal Grandfather    Hypertension (High Blood Pressure) Mother, Maternal Grandmother, Maternal Grandfather    Pancreatic Cancer Maternal Grandmother              Current Outpatient Medications   Medication Sig    albuterol  sulfate (PROVENTIL  OR VENTOLIN  OR PROAIR ) 90 mcg/actuation Inhalation oral inhaler Take 1-2 Puffs by inhalation Every 6 hours as needed    amLODIPine  (NORVASC ) 10 mg Oral Tablet Take 1 Tablet (10 mg total) by mouth Once a day    aspirin  (ECOTRIN) 81 mg Oral Tablet, Delayed Release (E.C.) Take 1 Tablet (81 mg total) by mouth Once a day    atorvastatin  calcium (ATORVASTATIN  ORAL) Take 80 mg by mouth Once a day    bethanechol chloride (URECHOLINE) 5 mg Oral Tablet bethanechol chloride 5 mg tablet   TAKE ONE TABLET BY MOUTH ONCE EVERY DAY    chlorzoxazone  (PARAFON  FORTE) 500 mg Oral Tablet Take 1 Tablet (500 mg total) by mouth Every 6 hours as needed for Muscle spasms    clopidogreL  (PLAVIX ) 75 mg Oral Tablet Take 1 Tablet (75 mg total) by mouth Once a day    cyanocobalamin (VITAMIN B12) 1,000 mcg/mL Injection Solution Inject 1 mL (1,000 mcg total) under the skin Every 14 days    cyclobenzaprine  (FLEXERIL ) 10 mg Oral Tablet Take 0.5 Tablets (5 mg total) by mouth Three times a day    cyclobenzaprine  (FLEXERIL ) 5 mg Oral Tablet cyclobenzaprine  5 mg tablet   Take 1 tablet twice a day by oral route for 30 days.    divalproex  (DEPAKOTE  ER) 250 mg Oral Tablet Sustained Release 24 hr Take 1 Tablet (250 mg total) by mouth    docusate sodium (COLACE) 100  mg Oral Capsule 100 mg by oral route.    dulaglutide (TRULICITY) 0.75 mg/0.5 mL Subcutaneous Pen Injector Inject 0.5 mL (0.75 mg total) under the skin Every 7 days     DULoxetine  (CYMBALTA  DR) 30 mg Oral Capsule, Delayed Release(E.C.) Take 1 Capsule (30 mg total) by mouth Twice daily    ergocalciferol , vitamin D2, (DRISDOL) 1,250 mcg (50,000 unit) Oral Capsule Take 1 Capsule (50,000 Units total) by mouth Every 7 days    famotidine  (PEPCID ) 40 mg Oral Tablet Take 1 Tablet (40 mg total) by mouth Twice daily for 30 days    furosemide  (LASIX ) 20 mg Oral Tablet Take 1 Tablet (20 mg total) by mouth Once per day as needed    gabapentin  (NEURONTIN ) 400 mg Oral Capsule Take 1 Capsule (400 mg total) by mouth Three times a day    hydrOXYzine  pamoate (VISTARIL ) 25 mg Oral Capsule Take 1 Capsule (25 mg total) by mouth Every night    insulin  detemir U-100 (LEVEMIR FLEXPEN) 100 unit/mL (3 mL) Subcutaneous Insulin  Pen Inject 60 Units under the skin Once per day as needed (SLIDING SCALE)    insulin  lispro (HUMALOG  KWIKPEN INSULIN  SUBQ) Inject under the skin 7-20 units tid before meals    ipratropium/albuterol  sulfate (COMBIVENT INHL) Take 1 Puff by inhalation Four times a day as needed    lidocaine -prilocaine  (EMLA ) 2.5-2.5 % Cream Apply topically to Port-A-Cath site 30 minutes prior to excessive port.    lisinopriL (PRINIVIL) 20 mg Oral Tablet Take 1 Tablet (20 mg total) by mouth Once a day    loratadine  (CLARITIN ) 10 mg Oral Tablet Take 1 Tablet (10 mg total) by mouth Once per day as needed    magnesium  chloride (SLOW-MAG) 64 mg Oral Tablet, Delayed Release (E.C.) Take 1 Tablet (64 mg total) by mouth Daily Slow mag 71.5 mg-119 mg days 2 tablets daily    MetFORMIN (GLUCOPHAGE) 1,000 mg Oral Tablet Take 1 Tablet (1,000 mg total) by mouth Every morning with breakfast    metoclopramide  (REGLAN ) 100 mcg/mL Oral Solution Take 5 mL (500 mcg total) by mouth Four times a day - before meals and bedtime    metoprolol  succinate (TOPROL -XL) 50 mg Oral Tablet Sustained Release 24 hr Take 1 Tablet (50 mg total) by mouth Once a day    omeprazole (PRILOSEC) 40 mg Oral Capsule, Delayed Release(E.C.) Take 1  Capsule (40 mg total) by mouth Once a day    ondansetron  (ZOFRAN  ODT) 8 mg Oral Tablet, Rapid Dissolve Take 1 Tablet (8 mg total) by mouth Every 8 hours as needed for Nausea/Vomiting    pregabalin  (LYRICA ) 50 mg Oral Capsule Take 1 Capsule (50 mg total) by mouth Every night    prochlorperazine  (COMPAZINE ) 10 mg Oral Tablet Take 1 Tablet (10 mg total) by mouth Four times a day as needed for Nausea/Vomiting For use when she can take oral.    promethazine  (PHENERGAN ) 25 mg Oral Tablet Take 1 Tablet (25 mg total) by mouth Every 4 hours as needed for Nausea/Vomiting    QUEtiapine  (SEROQUEL ) 50 mg Oral Tablet Take 1 Tablet (50 mg total) by mouth Every night for 30 days    rifAXIMin (XIFAXAN) 550 mg Oral Tablet Take 1 Tablet (550 mg total) by mouth Three times a day    rOPINIRole  (REQUIP ) 1 mg Oral Tablet Take 1 Tablet (1 mg total) by mouth Three times a day    tiZANidine  (ZANAFLEX ) 4 mg Oral Tablet Take 1 Tablet (4 mg total) by mouth Three times a  day    traMADoL  (ULTRAM ) 50 mg Oral Tablet Take 1 Tablet (50 mg total) by mouth Every 6 hours as needed for Pain (Patient taking differently: Take 2 Tablets (100 mg total) by mouth Every 8 hours as needed for Pain)    traZODone  (DESYREL ) 100 mg Oral Tablet Take 1 Tablet (100 mg total) by mouth Every night    verapamiL (CALAN) 40 mg Oral Tablet Take 1 Tablet (40 mg total) by mouth Once per day as needed (HEADACHE)       Allergies[1]      PHYSICAL EXAM:  BP 128/79 (Site: Left Arm, Patient Position: Sitting, Cuff Size: Adult)   Pulse 95   Temp 36.3 C (97.4 F) (Temporal)   SpO2 97%         ECOG Status: (2) Ambulatory and capable of self care, unable to carry out work activity, up and about > 50% or waking hours    Physical Exam  GENERAL: Alert, cooperative, well developed, ill appearing  HEENT: Normocephalic, normal oropharynx, moist mucous membranes  CHEST: Clear to auscultation bilaterally, No wheezes, rhonchi, or crackles  CARDIOVASCULAR: Normal heart rate and rhythm, S1 and  S2 normal without murmurs  ABDOMEN: Soft, non-tender, non-distended, without organomegaly, Normal bowel sounds  EXTREMITIES: No cyanosis or edema  NEUROLOGICAL: Cranial nerves grossly intact, Moves all extremities without gross motor or sensory deficit          LABS:   Results  LABS  Hemoglobin: 8.7  WBC: 3.1  Neutrophils: 2500  Iron  saturation: 5 (12/07/2023)  Serum iron : 25 (12/07/2023)  Iron  saturation: 14 (12/19/2023)  Serum iron : 57 (12/19/2023)    RADIOLOGY  CT abdomen: Bowel wall thickening and presence of stool      ASSESSMENT:    ICD-10-CM    1. Iron  deficiency anemia  D50.9 COMPREHENSIVE METABOLIC PANEL, NON-FASTING     IRON  TRANSFERRIN AND TIBC     FERRITIN     CBC/DIFF     Referral to ONCOLOGY INFUSION - [place] - (Chemo, Hydration, Injection, Infusion, Pheresis, Photopheresis))     MAGNESIUM      PHOSPHORUS     VITAMIN B12     lidocaine -prilocaine  (EMLA ) 2.5-2.5 % Cream     prochlorperazine  (COMPAZINE ) 10 mg Oral Tablet      2. Hx of gastric bypass  Z98.84 VITAMIN B12      3. Nausea and vomiting  R11.2 prochlorperazine  (COMPAZINE ) 10 mg Oral Tablet      4. Vomiting, unspecified vomiting type, unspecified whether nausea present  R11.10       5. 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 Hemoglobin is 8.7, significantly below the desired level of 12-13 for women. Iron  saturation improved from 7 to 14, but still below the target of 25-30. Oral iron  supplementation is ineffective due to malabsorption, necessitating IV iron  infusions. Blood transfusions provide temporary relief but do not address the underlying iron  deficiency. She experiences symptoms such as fatigue, weakness, and restless leg sensations, which may be related to iron  deficiency.  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 Symptoms include inability to retain food and fluids, leading to significant weight loss and nutritional deficiencies. She reports that even liquids can induce diarrhea.  - Prescribe Phenergan  gel for nausea  - Prescribe Compazine  for nausea when able to tolerate oral medications    Dehydration secondary to chronic vomiting and diarrhea  Dehydration due to chronic vomiting and diarrhea, exacerbated by malabsorption issues post-gastric bypass. Symptoms include fatigue, weakness, and difficulty maintaining hydration. CMP does not indicate dehydration, but clinical symptoms suggest otherwise. She reports significant vomiting and diarrhea, leading to difficulty retaining food and fluids.  - Administer IV fluids today at 5 PM  - Plan for additional IV fluids next week    Hypomagnesemia and hypophosphatemia (pending lab confirmation)  Potential hypomagnesemia and hypophosphatemia, pending lab results. Previous hospital records indicate low magnesium  levels. These deficiencies may contribute to fatigue and muscle cramps.  - Check magnesium  and phosphorus levels  - Administer IV magnesium  if levels are low    Peripheral neuropathy and muscle cramps  Peripheral neuropathy and muscle cramps, possibly related to nutritional deficiencies, particularly iron  deficiency. She reports a sensation of ants crawling inside the legs, which is not relieved by heat or rubbing.  - Check magnesium  levels  - Consider IV magnesium  supplementation if levels are low    Insomnia and fatigue  Chronic insomnia and fatigue, likely multifactorial due to nutritional deficiencies, pain, and other underlying conditions. Current medications include Seroquel , which has not been effective in improving sleep. She  reports being unable to sleep at night and feeling tired during the day.        Martha White was given the chance to ask questions, and these were answered to their satisfaction. The patient is welcome to call with any questions or concerns in the meantime.     Return in about 2 weeks (around 01/02/2024).   This note was created using [employer] M*Modal Fluency Align mobile application via conversational audio. Consent for audio recording was obtained by patient/family members prior to recording.     Martha Clara APRN, FNP-BC, AOCNP, 12/19/2023 , 16:40   You can see your note(s) in [facility]. It is common for you to encounter certain medical terminology which may be unfamiliar to you. You might see results before your provider does so please give at least 2 business days for review. Please have this understanding, that NOT all abnormal results are significant. Our office will contact you for any urgent or emergent action if necessary. If you have any questions or concerns, feel free to send a MyChart message or call the office. Please call with any new or concerning symptoms. .    CC:  Eleanor JONELLE Lee, FNP  [address]  [place] [address]    Lendia Riesa Skiff, MD  [address]  [place],  [place] [address]      This note was partially generated using MModal Fluency Direct system, and there may be some incorrect words, spellings, and punctuation that were not noted in checking the note before saving.                              [1]   Allergies  Allergen Reactions    Alcohol  Shortness of Breath     Etoh, not rubbing alcohol       Sulfa (Sulfonamides) Anaphylaxis    Ibuprofen     Influenza Virus Vaccines     Influenza Virus  Vaccine, Specific Hives/ Urticaria    Penicillins Hives/ Urticaria    Pneumococcal Vaccine Hives/ Urticaria

## 2023-12-19 NOTE — Nurses Notes (Addendum)
 [id number]- Patient arrived to the unit and was placed in a room. Vital signs WDL.   1715- Port accessed on right chest. Flushed without difficulty, blood return present. Transparent border dressing applied.   1714- NS blous started at 999 for one hour.   1814- Bolus finished. Patient tolerated well.   1816- Port de accessed. Flushed without difficulty. Blood return present. Pressure dressing applied  1820- Patient left unit ambulitory with out complaints.

## 2023-12-19 NOTE — Nursing Note (Signed)
 Promethazine  gel 12 doses directions Q 4-6 hours PRN with 1 refill called into [facility] per Randine Clara APP request.

## 2023-12-19 NOTE — Nurses Notes (Signed)
 [id number] Patient came in ambulatory for port flush and labs prior to seeing Randine Clara, NP at the oncology clinic. Port accessed. Labs drawn. Port flushed and deaccessed. Site clear. Left unit ambulatory with no c/o offered. Sharlet Montclair, RN

## 2023-12-20 ENCOUNTER — Ambulatory Visit (INDEPENDENT_AMBULATORY_CARE_PROVIDER_SITE_OTHER): Payer: Self-pay | Admitting: NURSE PRACTITIONER

## 2023-12-20 ENCOUNTER — Encounter (HOSPITAL_COMMUNITY): Payer: Self-pay | Admitting: NURSE PRACTITIONER

## 2023-12-20 ENCOUNTER — Other Ambulatory Visit (INDEPENDENT_AMBULATORY_CARE_PROVIDER_SITE_OTHER): Payer: Self-pay | Admitting: NURSE PRACTITIONER

## 2023-12-23 ENCOUNTER — Other Ambulatory Visit: Payer: Self-pay

## 2023-12-23 ENCOUNTER — Encounter (HOSPITAL_COMMUNITY): Payer: Self-pay | Admitting: NURSE PRACTITIONER

## 2023-12-23 ENCOUNTER — Ambulatory Visit
Admission: RE | Admit: 2023-12-23 | Discharge: 2023-12-23 | Disposition: A | Discharge status: Home / Self Care | Payer: MEDICAID | Source: Ambulatory Visit

## 2023-12-23 VITALS — BP 130/78 | HR 91 | Temp 97.6°F | Resp 20

## 2023-12-23 DIAGNOSIS — E559 Vitamin D deficiency, unspecified: Secondary | ICD-10-CM | POA: Insufficient documentation

## 2023-12-23 DIAGNOSIS — E041 Nontoxic single thyroid nodule: Secondary | ICD-10-CM | POA: Insufficient documentation

## 2023-12-23 DIAGNOSIS — Z1159 Encounter for screening for other viral diseases: Secondary | ICD-10-CM

## 2023-12-23 DIAGNOSIS — Z789 Other specified health status: Secondary | ICD-10-CM | POA: Insufficient documentation

## 2023-12-23 DIAGNOSIS — D72819 Decreased white blood cell count, unspecified: Secondary | ICD-10-CM | POA: Insufficient documentation

## 2023-12-23 DIAGNOSIS — K3184 Gastroparesis: Secondary | ICD-10-CM | POA: Insufficient documentation

## 2023-12-23 DIAGNOSIS — Z114 Encounter for screening for human immunodeficiency virus [HIV]: Secondary | ICD-10-CM | POA: Insufficient documentation

## 2023-12-23 DIAGNOSIS — D509 Iron deficiency anemia, unspecified: Secondary | ICD-10-CM | POA: Insufficient documentation

## 2023-12-23 LAB — HIV 1 AND 2 RAPID SCREEN
HIV-1/2 ANTIBODY SCREEN: NONREACTIVE
HIV1-p24 ANTIGEN SCREEN: NONREACTIVE

## 2023-12-23 LAB — COMPREHENSIVE METABOLIC PANEL, NON-FASTING
ALBUMIN/GLOBULIN RATIO: 1.2 (ref 0.8–1.4)
ALBUMIN: 3.5 g/dL (ref 3.5–5.7)
ALKALINE PHOSPHATASE: 72 U/L (ref 34–104)
ALT (SGPT): 45 U/L (ref 7–52)
ANION GAP: 5 mmol/L (ref 4–13)
AST (SGOT): 39 U/L (ref 13–39)
BILIRUBIN TOTAL: 0.4 mg/dL (ref 0.3–1.0)
BUN/CREA RATIO: 15 (ref 6–22)
BUN: 13 mg/dL (ref 7–25)
CALCIUM, CORRECTED: 9.3 mg/dL (ref 8.9–10.8)
CALCIUM: 8.9 mg/dL (ref 8.6–10.3)
CHLORIDE: 111 mmol/L — ABNORMAL HIGH (ref 98–107)
CO2 TOTAL: 24 mmol/L (ref 21–31)
CREATININE: 0.86 mg/dL (ref 0.60–1.30)
ESTIMATED GFR: 77 mL/min/1.73mˆ2 (ref >59)
GLOBULIN: 3 (ref 2.0–3.5)
GLUCOSE: 78 mg/dL (ref 74–109)
OSMOLALITY, CALCULATED: 278 mosm/kg (ref 270–290)
POTASSIUM: 4 mmol/L (ref 3.5–5.1)
PROTEIN TOTAL: 6.5 g/dL (ref 6.4–8.9)
SODIUM: 140 mmol/L (ref 136–145)

## 2023-12-23 LAB — IRON TRANSFERRIN AND TIBC
IRON (TRANSFERRIN) SATURATION: 14 % — ABNORMAL LOW (ref 15–50)
IRON: 51 ug/dL (ref 50–212)
TOTAL IRON BINDING CAPACITY: 377 ug/dL (ref 250–450)
TRANSFERRIN: 269 mg/dL (ref 203–362)
UIBC: 326 ug/dL (ref 130–375)

## 2023-12-23 LAB — FERRITIN: FERRITIN: 130 ng/mL (ref 11–307)

## 2023-12-23 LAB — THYROXINE, FREE (FREE T4): THYROXINE (T4), FREE: 0.58 ng/dL — ABNORMAL LOW (ref 0.61–1.12)

## 2023-12-23 LAB — THYROID STIMULATING HORMONE (SENSITIVE TSH): TSH: 1.237 u[IU]/mL (ref 0.450–5.330)

## 2023-12-23 LAB — VITAMIN D 25 TOTAL: VITAMIN D 25, TOTAL: 12.78 ng/mL — ABNORMAL LOW (ref 30.00–100.00)

## 2023-12-23 MED ORDER — SODIUM CHLORIDE 0.9 % (FLUSH) INJECTION SYRINGE
20.0000 mL | INJECTION | INTRAMUSCULAR | Status: DC | PRN
Start: 2023-12-23 — End: 2023-12-24

## 2023-12-23 NOTE — Nurses Notes (Signed)
 [id number] Patient came in ambulatory for port flush and labs. Port accessed. Labs drawn. Port flushed and deaccessed. Site clear. Left unit ambulatory with no c/o offered. Sharlet Montclair, RN

## 2023-12-24 ENCOUNTER — Other Ambulatory Visit (INDEPENDENT_AMBULATORY_CARE_PROVIDER_SITE_OTHER): Payer: Self-pay | Admitting: NURSE PRACTITIONER

## 2023-12-24 ENCOUNTER — Encounter (HOSPITAL_COMMUNITY): Payer: Self-pay | Admitting: NURSE PRACTITIONER

## 2023-12-24 LAB — T3 (TRIIODOTHYRONINE), FREE, SERUM: T3 FREE: 2.1 pg/mL (ref 1.7–3.7)

## 2023-12-26 ENCOUNTER — Other Ambulatory Visit: Payer: Self-pay

## 2023-12-26 ENCOUNTER — Ambulatory Visit
Admission: RE | Admit: 2023-12-26 | Discharge: 2023-12-26 | Disposition: A | Discharge status: Home / Self Care | Payer: MEDICAID | Source: Ambulatory Visit | Attending: NURSE PRACTITIONER | Admitting: NURSE PRACTITIONER

## 2023-12-26 ENCOUNTER — Other Ambulatory Visit (INDEPENDENT_AMBULATORY_CARE_PROVIDER_SITE_OTHER): Payer: Self-pay | Admitting: NURSE PRACTITIONER

## 2023-12-26 VITALS — BP 149/99 | HR 89 | Temp 97.9°F | Resp 20

## 2023-12-26 DIAGNOSIS — D509 Iron deficiency anemia, unspecified: Secondary | ICD-10-CM | POA: Insufficient documentation

## 2023-12-26 DIAGNOSIS — R1110 Vomiting, unspecified: Secondary | ICD-10-CM | POA: Insufficient documentation

## 2023-12-26 DIAGNOSIS — R197 Diarrhea, unspecified: Secondary | ICD-10-CM | POA: Insufficient documentation

## 2023-12-26 MED ORDER — ALBUTEROL SULFATE 2.5 MG/3 ML (0.083 %) SOLUTION FOR NEBULIZATION
2.5000 mg | INHALATION_SOLUTION | Freq: Once | RESPIRATORY_TRACT | Status: DC | PRN
Start: 2023-12-26 — End: 2023-12-26

## 2023-12-26 MED ORDER — FAMOTIDINE (PF) 20 MG/2 ML INTRAVENOUS SOLUTION
20.0000 mg | Freq: Once | INTRAVENOUS | Status: DC | PRN
Start: 2023-12-26 — End: 2023-12-27

## 2023-12-26 MED ORDER — SODIUM CHLORIDE 0.9 % IV BOLUS
1000.0000 mL | INJECTION | Freq: Once | Status: AC
Start: 2023-12-26 — End: 2023-12-26
  Administered 2023-12-26: 0 mL via INTRAVENOUS
  Administered 2023-12-26: 1000 mL via INTRAVENOUS

## 2023-12-26 MED ORDER — SODIUM CHLORIDE 0.9 % INTRAVENOUS SOLUTION
300.0000 mg | Freq: Once | INTRAVENOUS | Status: AC
Start: 2023-12-26 — End: 2023-12-26
  Administered 2023-12-26: 300 mg via INTRAVENOUS
  Administered 2023-12-26: 0 mg via INTRAVENOUS
  Filled 2023-12-26: qty 15

## 2023-12-26 MED ORDER — HYDROCORTISONE SOD SUCCINATE 100 MG/2 ML VIAL WRAPPER
100.0000 mg | Freq: Once | INTRAMUSCULAR | Status: DC | PRN
Start: 2023-12-26 — End: 2023-12-27

## 2023-12-26 MED ORDER — LORATADINE 10 MG TABLET
20.0000 mg | ORAL_TABLET | Freq: Once | ORAL | Status: DC | PRN
Start: 2023-12-26 — End: 2023-12-27

## 2023-12-26 MED ORDER — EPINEPHRINE 1 MG/ML (1 ML) INJECTION SOLUTION
0.3000 mg | Freq: Once | INTRAMUSCULAR | Status: DC | PRN
Start: 2023-12-26 — End: 2023-12-27

## 2023-12-26 MED ORDER — ACETAMINOPHEN 325 MG TABLET
975.0000 mg | ORAL_TABLET | Freq: Once | ORAL | Status: DC | PRN
Start: 2023-12-26 — End: 2023-12-27

## 2023-12-26 MED ORDER — DEXTROSE 5% IN WATER (D5W) FLUSH BAG - 250 ML
INTRAVENOUS | Status: DC | PRN
Start: 2023-12-26 — End: 2023-12-27

## 2023-12-26 MED ORDER — SODIUM CHLORIDE 0.9% FLUSH BAG - 250 ML
INTRAVENOUS | Status: DC | PRN
Start: 2023-12-26 — End: 2023-12-27

## 2023-12-26 MED ORDER — ALBUTEROL SULFATE HFA 90 MCG/ACTUATION AEROSOL INHALER - RN
2.0000 | Freq: Once | RESPIRATORY_TRACT | Status: DC | PRN
Start: 2023-12-26 — End: 2023-12-26

## 2023-12-26 MED ORDER — SODIUM CHLORIDE 0.9 % IV BOLUS
500.0000 mL | INJECTION | Freq: Once | Status: DC | PRN
Start: 2023-12-26 — End: 2023-12-27

## 2023-12-26 MED ORDER — PROCHLORPERAZINE MALEATE 10 MG TABLET
10.0000 mg | ORAL_TABLET | Freq: Once | ORAL | Status: DC | PRN
Start: 2023-12-26 — End: 2023-12-27

## 2023-12-26 NOTE — Nurses Notes (Signed)
 Summary: pt arrived an received  iron  infusion and a ns bolus of 1 liter.no complaints or distress noted during or after treatment.port accessed with no problem.assessment WNL.infusion complete,port deaccessed with no problem.pt down via walking with husband at DC no concerns voiced upon leaving floor.Marshall Shropshire, LPN

## 2023-12-27 ENCOUNTER — Other Ambulatory Visit (INDEPENDENT_AMBULATORY_CARE_PROVIDER_SITE_OTHER): Payer: Self-pay | Admitting: NURSE PRACTITIONER

## 2023-12-28 ENCOUNTER — Encounter (HOSPITAL_COMMUNITY): Payer: Self-pay

## 2023-12-28 ENCOUNTER — Encounter (HOSPITAL_COMMUNITY): Payer: Self-pay | Admitting: NURSE PRACTITIONER

## 2023-12-31 ENCOUNTER — Emergency Department (HOSPITAL_BASED_OUTPATIENT_CLINIC_OR_DEPARTMENT_OTHER)

## 2023-12-31 ENCOUNTER — Other Ambulatory Visit: Payer: Self-pay

## 2023-12-31 ENCOUNTER — Emergency Department (EMERGENCY_DEPARTMENT_HOSPITAL)
Admission: EM | Admit: 2023-12-31 | Discharge: 2023-12-31 | Disposition: A | Discharge status: Home / Self Care | Source: Home / Self Care | Attending: Emergency Medicine | Admitting: Emergency Medicine

## 2023-12-31 ENCOUNTER — Other Ambulatory Visit (INDEPENDENT_AMBULATORY_CARE_PROVIDER_SITE_OTHER): Payer: Self-pay | Admitting: NURSE PRACTITIONER

## 2023-12-31 ENCOUNTER — Encounter (HOSPITAL_BASED_OUTPATIENT_CLINIC_OR_DEPARTMENT_OTHER): Payer: Self-pay

## 2023-12-31 DIAGNOSIS — R079 Chest pain, unspecified: Secondary | ICD-10-CM | POA: Insufficient documentation

## 2023-12-31 DIAGNOSIS — J189 Pneumonia, unspecified organism: Secondary | ICD-10-CM

## 2023-12-31 DIAGNOSIS — D649 Anemia, unspecified: Secondary | ICD-10-CM | POA: Insufficient documentation

## 2023-12-31 DIAGNOSIS — R000 Tachycardia, unspecified: Secondary | ICD-10-CM | POA: Insufficient documentation

## 2023-12-31 DIAGNOSIS — Z90710 Acquired absence of both cervix and uterus: Secondary | ICD-10-CM

## 2023-12-31 DIAGNOSIS — K5939 Other megacolon: Secondary | ICD-10-CM | POA: Insufficient documentation

## 2023-12-31 DIAGNOSIS — R110 Nausea: Secondary | ICD-10-CM

## 2023-12-31 DIAGNOSIS — R1012 Left upper quadrant pain: Secondary | ICD-10-CM | POA: Insufficient documentation

## 2023-12-31 DIAGNOSIS — R0602 Shortness of breath: Secondary | ICD-10-CM

## 2023-12-31 DIAGNOSIS — R600 Localized edema: Secondary | ICD-10-CM | POA: Insufficient documentation

## 2023-12-31 DIAGNOSIS — R531 Weakness: Secondary | ICD-10-CM | POA: Insufficient documentation

## 2023-12-31 LAB — SCAN DIFFERENTIAL
PLATELET MORPHOLOGY COMMENT: NORMAL
SCHISTOCYTES: ABSENT

## 2023-12-31 LAB — LACTIC ACID LEVEL W/ REFLEX FOR LEVEL >2.0: LACTIC ACID: 0.6 mmol/L (ref 0.4–2.0)

## 2023-12-31 LAB — BLOOD GAS W/ CO-OX, LYTES, LACTATE REFLEX
%FIO2 (VENOUS): 21 %
BASE DEFICIT: 0.8 mmol/L (ref 0.0–3.0)
BICARBONATE (VENOUS): 23.8 mmol/L (ref 22.0–29.0)
CARBOXYHEMOGLOBIN: 2 % (ref <=3.0)
CHLORIDE: 108 mmol/L — ABNORMAL HIGH (ref 98–107)
GLUCOSE: 83 mg/dL (ref 65–125)
HEMATOCRITRT: 20 % — ABNORMAL LOW (ref 37–50)
HEMOGLOBIN: 6.7 g/dL — CL (ref 12.0–18.0)
IONIZED CALCIUM: 1.23 mmol/L (ref 1.15–1.33)
LACTATE: 0.7 mmol/L (ref <=1.9)
MET-HEMOGLOBIN: 0.7 % (ref <=1.5)
O2 SATURATION (VENOUS): 61.8 % (ref 40.0–85.0)
O2CT: 5.5 %
OXYHEMOGLOBIN: 58.3 % (ref 40.0–80.0)
PCO2 (VENOUS): 45 mmHg (ref 41–51)
PH (VENOUS): 7.35 (ref 7.32–7.43)
PO2 (VENOUS): 34 mmHg (ref 35–50)
SODIUM: 137 mmol/L (ref 136–145)
WHOLE BLOOD POTASSIUM: 4.5 mmol/L (ref 3.5–5.1)

## 2023-12-31 LAB — COMPREHENSIVE METABOLIC PANEL, NON-FASTING
ALBUMIN/GLOBULIN RATIO: 0.9 (ref 0.8–1.4)
ALBUMIN: 3.2 g/dL — ABNORMAL LOW (ref 3.4–5.0)
ALKALINE PHOSPHATASE: 121 U/L — ABNORMAL HIGH (ref 46–116)
ALT (SGPT): 176 U/L — ABNORMAL HIGH (ref <=78)
ANION GAP: 7 mmol/L (ref 4–13)
AST (SGOT): 163 U/L — ABNORMAL HIGH (ref 15–37)
BILIRUBIN TOTAL: 0.2 mg/dL (ref 0.2–1.0)
BUN/CREA RATIO: 10
BUN: 12 mg/dL (ref 7–18)
CALCIUM, CORRECTED: 9.2 mg/dL
CALCIUM: 8.6 mg/dL (ref 8.5–10.1)
CHLORIDE: 106 mmol/L (ref 98–107)
CO2 TOTAL: 25 mmol/L (ref 21–32)
CREATININE: 1.2 mg/dL — ABNORMAL HIGH (ref 0.55–1.02)
ESTIMATED GFR: 52 mL/min/1.73mˆ2 — ABNORMAL LOW (ref >59)
GLOBULIN: 3.5
GLUCOSE: 76 mg/dL (ref 74–106)
OSMOLALITY, CALCULATED: 274 mosm/kg (ref 270–290)
POTASSIUM: 4.1 mmol/L (ref 3.5–5.1)
PROTEIN TOTAL: 6.7 g/dL (ref 6.4–8.2)
SODIUM: 138 mmol/L (ref 136–145)

## 2023-12-31 LAB — PT/INR
INR: 0.92 (ref 0.84–1.10)
PROTHROMBIN TIME: 10.4 s (ref 9.8–12.7)

## 2023-12-31 LAB — CBC WITH DIFF
BASOPHIL #: 0.03 x10ˆ3/uL (ref 0.00–0.10)
BASOPHIL %: 1 % (ref 0–1)
EOSINOPHIL #: 0.14 x10ˆ3/uL (ref 0.00–0.50)
EOSINOPHIL %: 4 % (ref 1–7)
HCT: 29.7 % — ABNORMAL LOW (ref 31.2–41.9)
HGB: 9.1 g/dL — ABNORMAL LOW (ref 10.9–14.3)
LYMPHOCYTE #: 0.98 x10ˆ3/uL — ABNORMAL LOW (ref 1.10–3.10)
LYMPHOCYTE %: 28 % (ref 16–46)
MCH: 24.8 pg (ref 24.7–32.8)
MCHC: 30.6 g/dL — ABNORMAL LOW (ref 32.3–35.6)
MCV: 80.8 fL (ref 75.5–95.3)
MONOCYTE #: 0.41 x10ˆ3/uL (ref 0.20–0.90)
MONOCYTE %: 12 % — ABNORMAL HIGH (ref 4–11)
MPV: 8.1 fL (ref 7.9–10.8)
NEUTROPHIL #: 1.95 x10ˆ3/uL (ref 1.90–8.20)
NEUTROPHIL %: 55 % (ref 43–77)
PLATELETS: 221 x10ˆ3/uL (ref 140–440)
RBC: 3.67 x10ˆ6/uL (ref 3.63–4.92)
RDW: 23.7 % — ABNORMAL HIGH (ref 12.3–17.7)
WBC: 3.5 x10ˆ3/uL — ABNORMAL LOW (ref 3.8–11.8)

## 2023-12-31 LAB — URINALYSIS, MACRO/MICRO
BILIRUBIN: NEGATIVE mg/dL
BLOOD: NEGATIVE mg/dL
GLUCOSE: NEGATIVE mg/dL
KETONES: NEGATIVE mg/dL
LEUKOCYTES: NEGATIVE WBCs/uL
NITRITE: NEGATIVE
PH: 6 (ref 4.6–8.0)
PROTEIN: NEGATIVE mg/dL
SPECIFIC GRAVITY: 1.01 (ref 1.003–1.035)
UROBILINOGEN: 0.2 mg/dL (ref 0.2–1.0)

## 2023-12-31 LAB — TROPONIN-I
TROPONIN I: 3 ng/L (ref <15)
TROPONIN I: 3 ng/L (ref <15)
TROPONIN I: 3 ng/L (ref <15)

## 2023-12-31 LAB — LIPASE: LIPASE: 13 U/L — ABNORMAL LOW (ref 15–77)

## 2023-12-31 LAB — PTT (PARTIAL THROMBOPLASTIN TIME): APTT: 31.8 s (ref 25.0–38.0)

## 2023-12-31 LAB — MAGNESIUM: MAGNESIUM: 1.9 mg/dL (ref 1.8–2.4)

## 2023-12-31 LAB — POC BLOOD GLUCOSE (RESULTS): GLUCOSE, POC: 74 mg/dL (ref 70–100)

## 2023-12-31 LAB — D-DIMER: D-DIMER: 1510 ng{FEU}/mL (ref 215–500)

## 2023-12-31 LAB — NT-PROBNP: NT-PROBNP: 19 pg/mL (ref <=125)

## 2023-12-31 MED ORDER — BUPRENORPHINE HCL 0.3 MG/ML INJECTION SOLUTION
0.1500 mg | INTRAMUSCULAR | Status: AC
Start: 2023-12-31 — End: 2023-12-31
  Administered 2023-12-31: 0.15 mg via INTRAVENOUS

## 2023-12-31 MED ORDER — PROCHLORPERAZINE EDISYLATE 10 MG/2 ML (5 MG/ML) INJECTION SOLUTION
5.0000 mg | INTRAMUSCULAR | Status: AC
Start: 2023-12-31 — End: 2023-12-31
  Administered 2023-12-31: 5 mg via INTRAVENOUS

## 2023-12-31 MED ORDER — SODIUM CHLORIDE 0.9 % INTRAVENOUS SOLUTION
100.0000 mg | INTRAVENOUS | Status: AC
Start: 2023-12-31 — End: 2023-12-31
  Administered 2023-12-31: 0 mg via INTRAVENOUS
  Administered 2023-12-31: 100 mg via INTRAVENOUS

## 2023-12-31 MED ORDER — PROCHLORPERAZINE EDISYLATE 10 MG/2 ML (5 MG/ML) INJECTION SOLUTION
INTRAMUSCULAR | Status: AC
Start: 2023-12-31 — End: 2023-12-31
  Filled 2023-12-31: qty 2

## 2023-12-31 MED ORDER — SODIUM CHLORIDE 0.9 % INTRAVENOUS SOLUTION
INTRAVENOUS | Status: AC
Start: 2023-12-31 — End: 2023-12-31
  Filled 2023-12-31: qty 50

## 2023-12-31 MED ORDER — HEPARIN, PORCINE (PF) 100 UNIT/ML INTRAVENOUS SYRINGE
1.0000 mL | INJECTION | Freq: Once | INTRAVENOUS | Status: AC
Start: 2023-12-31 — End: 2023-12-31
  Administered 2023-12-31: 3 mL

## 2023-12-31 MED ORDER — HEPARIN, PORCINE (PF) 100 UNIT/ML INTRAVENOUS SYRINGE
5.0000 mL | INJECTION | Freq: Once | INTRAVENOUS | Status: DC
Start: 2023-12-31 — End: 2023-12-31
  Administered 2023-12-31: 0 mL

## 2023-12-31 MED ORDER — SODIUM CHLORIDE 0.9 % INTRAVENOUS SOLUTION
2.0000 g | INTRAVENOUS | Status: AC
Start: 2023-12-31 — End: 2023-12-31
  Administered 2023-12-31: 0 g via INTRAVENOUS
  Administered 2023-12-31: 2 g via INTRAVENOUS

## 2023-12-31 MED ORDER — IOPAMIDOL 370 MG IODINE/ML (76 %) INTRAVENOUS SOLUTION
100.0000 mL | INTRAVENOUS | Status: AC
Start: 2023-12-31 — End: 2023-12-31
  Administered 2023-12-31: 100 mL via INTRAVENOUS
  Filled 2023-12-31: qty 100

## 2023-12-31 MED ORDER — ACETAMINOPHEN 325 MG TABLET
650.0000 mg | ORAL_TABLET | ORAL | Status: DC | PRN
Start: 2023-12-31 — End: 2024-01-01

## 2023-12-31 MED ORDER — BUPRENORPHINE HCL 0.3 MG/ML INJECTION SOLUTION
INTRAMUSCULAR | Status: AC
Start: 2023-12-31 — End: 2023-12-31
  Filled 2023-12-31: qty 1

## 2023-12-31 MED ORDER — HEPARIN, PORCINE (PF) 100 UNIT/ML INTRAVENOUS SYRINGE
INJECTION | INTRAVENOUS | Status: AC
Start: 2023-12-31 — End: 2023-12-31
  Filled 2023-12-31: qty 5

## 2023-12-31 MED ORDER — PROCHLORPERAZINE EDISYLATE 10 MG/2 ML (5 MG/ML) INJECTION SOLUTION
2.5000 mg | INTRAMUSCULAR | Status: AC
Start: 2023-12-31 — End: 2023-12-31
  Administered 2023-12-31: 2.5 mg via INTRAVENOUS

## 2023-12-31 MED ORDER — SODIUM CHLORIDE 0.9 % INTRAVENOUS SOLUTION
INTRAVENOUS | Status: AC
Start: 2023-12-31 — End: 2023-12-31
  Filled 2023-12-31: qty 100

## 2023-12-31 MED ORDER — CEFTRIAXONE 2 GRAM SOLUTION FOR INJECTION
INTRAMUSCULAR | Status: AC
Start: 2023-12-31 — End: 2023-12-31
  Filled 2023-12-31: qty 20

## 2023-12-31 MED ORDER — BUPRENORPHINE HCL 0.3 MG/ML INJECTION SOLUTION
0.1500 mg | INTRAMUSCULAR | Status: DC
Start: 2023-12-31 — End: 2023-12-31

## 2023-12-31 MED ORDER — DOXYCYCLINE HYCLATE 100 MG CAPSULE
100.0000 mg | ORAL_CAPSULE | Freq: Two times a day (BID) | ORAL | 0 refills | Status: DC
Start: 2023-12-31 — End: 2024-01-03

## 2023-12-31 MED ORDER — DOXYCYCLINE HYCLATE 100 MG INTRAVENOUS POWDER FOR SOLUTION
INTRAVENOUS | Status: AC
Start: 2023-12-31 — End: 2023-12-31
  Filled 2023-12-31: qty 10

## 2023-12-31 NOTE — ED Nurses Note (Signed)
 Respiratory instructed pt on use of incentive spirometer

## 2023-12-31 NOTE — ED Attending Note (Signed)
 Patient transferred to my service at 10:00 p.m. chart was reviewed.  Patient management was reviewed by hospitalist group at [facility] for possible admission.  It was decided patient did not meet criteria for inpatient management.  Patient to complete IV antibiotics we will be discharged home follow-up on Thursday with primary physician for reexamination

## 2023-12-31 NOTE — ED Provider Notes (Signed)
 [facility], [facility], [place] - Emergency Department  ED Primary Note  History of Present Illness   Martha White is a 58 y.o. female who had concerns including Weakness. Pt states she is feeling weaker and weaker. Interm cp sob left upper abd pain into back states she has anemia. Not sure where blood is going had two iron  infusions. Not eating ice as much as she was. States waking up coughing at night, chilling.   Review of Systems   Constitutional: No fever, +chills weakness   Skin: No rash or diaphoresis  HENT: No headaches, or congestion  Eyes: No vision changes or photophobia   Cardio: + chest pain, palpitations , elevated hr + leg swelling   Respiratory:+ cough, wheezing SOB  GI:  + nausea, vomiting abd pain no  stool changes  GU:  No dysuria, hematuria, or increased frequency  MSK: No muscle aches, joint or back pain  Neuro: No seizures, LOC, numbness, tingling, or focal weakness  Psychiatric: No depression, SI or substance abuse  All other systems reviewed and are negative.      Physical Exam   ED Triage Vitals [12/31/23 1654]   BP (Non-Invasive) (!) 142/79   Heart Rate (!) 110   Respiratory Rate 17   Temperature 36.7 C (98.1 F)   SpO2 100 %   Weight 71.7 kg (158 lb)   Height 1.499 m (4' 11)     Constitutional:  58 y.o. female who appears in no distress. Normal color, no cyanosis.   HENT:   Head: Normocephalic and atraumatic.   Mouth/Throat: Oropharynx is clear and dry   Eyes: EOMI, PERRL   Neck: Trachea midline. Neck supple.  Cardiovascular: RRR, No murmurs, rubs or gallops. Intact distal pulses.  Pulmonary/Chest: BS equal bilaterally, diminished bibasilar. No respiratory distress. No wheezes, rales or chest tenderness.   Abdominal: Bowel sounds present and normal. Abdomen soft, no tenderness, no rebound and no guarding.  Back: No midline spinal tenderness, no paraspinal tenderness, no CVA tenderness.           Musculoskeletal: + 2 plus np [pedal;  no edema, tenderness or deformity.  Skin:  warm and dry. No rash, erythema, pallor or cyanosis  Psychiatric: normal mood and affect. Behavior is normal.   Neurological: Patient keenly alert and responsive, easily able to raise eyebrows, facial muscles/expressions symmetric, speaking in fluent sentences, moving all extremities equally and fully, slow gait.   Patient Data   Labs Ordered/Reviewed   COMPREHENSIVE METABOLIC PANEL, NON-FASTING - Abnormal; Notable for the following components:       Result Value    CREATININE 1.20 (*)     ESTIMATED GFR 52 (*)     ALBUMIN 3.2 (*)     ALKALINE PHOSPHATASE 121 (*)     ALT (SGPT) 176 (*)     AST (SGOT) 163 (*)     All other components within normal limits    Narrative:     Estimated Glomerular Filtration Rate (eGFR) is calculated using the CKD-EPI (2021) equation, intended for patients 9 years of age and older. If gender is not documented or unknown, there will be no eGFR calculation.   D-DIMER - Abnormal; Notable for the following components:    D-DIMER 1,510 (*)     All other components within normal limits    Narrative:     D-Dimers are reported in FEU per ng/mL.    IF PATIENT IS EXHIBITING SYMPTOMS DVT/PE, THIS D-DIMER RESULT MAY INDICATE A NEED FOR FURTHER TESTING  FOR THESE CONDITIONS. IF PATIENT IS SUSPECTED OF DIC AND SYMPTOMS WORSEN OR PERSIST, A REPEAT DIC WORKUP SHOULD BE CONSIDERED.    NOTE: ALTHOUGH THE NORMAL RANGE FOR THIS TEST IS 215-500 ng/mL FEU, LITERATURE RECOMMENDS FURTHER TESTING FOR ANY RESULT >500ng/mL FEU.    A cut off value of 500ng/mL FEU or below can be used as an aid in the diagnosis of Thromboembolism when used in conjunction with the patient's medical history, clinical presentation and other findings. Results of 500ng/mL FEU or below have a negative predictive value of 100%.     LIPASE - Abnormal; Notable for the following components:    LIPASE 13 (*)     All other components within normal limits   CBC WITH DIFF - Abnormal; Notable for the following components:    WBC 3.5 (*)     HGB  9.1 (*)     HCT 29.7 (*)     MCHC 30.6 (*)     RDW 23.7 (*)     MONOCYTE % 12 (*)     LYMPHOCYTE # 0.98 (*)     All other components within normal limits   BLOOD GAS W/ CO-OX, LYTES, LACTATE REFLEX - Abnormal; Notable for the following components:    HEMOGLOBIN 6.7 (*)     HEMATOCRITRT 20 (*)     CHLORIDE 108 (*)     All other components within normal limits    Narrative:     Lab collected.  Results given to Elesa Holts, FNP  Manufacturer does not recommend venous sample for assessment of patient oxygenation status. A reference range for pO2, Oxyhemoglobin and Oxygen Saturation is provided but abnormal results will not flag in Epic.   PT/INR - Normal    Narrative:     In the setting of warfarin therapy, a moderate-intensity INR goal range is 2.0 to 3.0 and a high-intensity INR goal range is 2.5 to 3.5.    INR is ONLY validated to determine the level of anticoagulation with vitamin K antagonists (warfarin). Other factors may elevate the INR including but not limited to direct oral anticoagulants (DOACs), liver dysfunction, vitamin K deficiency, DIC, factor deficiencies, and factor inhibitors.   PTT (PARTIAL THROMBOPLASTIN TIME) - Normal   TROPONIN-I - Normal    Narrative:     Values received on females ranging between 12-15 ng/L MUST include the next serial troponin to review changes in the delta differences as the reference range for the Access II chemistry analyzer is lower than the established reference range.     TROPONIN-I - Normal    Narrative:     Values received on females ranging between 12-15 ng/L MUST include the next serial troponin to review changes in the delta differences as the reference range for the Access II chemistry analyzer is lower than the established reference range.     TROPONIN-I - Normal    Narrative:     Values received on females ranging between 12-15 ng/L MUST include the next serial troponin to review changes in the delta differences as the reference range for the Access II  chemistry analyzer is lower than the established reference range.     MAGNESIUM  - Normal   NT-PROBNP - Normal   URINALYSIS, MACRO/MICRO - Normal   POC BLOOD GLUCOSE (RESULTS) - Normal   ADULT ROUTINE BLOOD CULTURE, SET OF 2 BOTTLES (BACTERIA AND YEAST)   ADULT ROUTINE BLOOD CULTURE, SET OF 2 BOTTLES (BACTERIA AND YEAST)   CBC/DIFF    Narrative:  The following orders were created for panel order CBC/DIFF.  Procedure                               Abnormality         Status                     ---------                               -----------         ------                     CBC WITH IPQQ[[id number]]                Abnormal            Final result                 Please view results for these tests on the individual orders.   SCAN DIFFERENTIAL   URINALYSIS WITH REFLEX MICROSCOPIC AND CULTURE IF POSITIVE    Narrative:     The following orders were created for panel order URINALYSIS WITH REFLEX MICROSCOPIC AND CULTURE IF POSITIVE.  Procedure                               Abnormality         Status                     ---------                               -----------         ------                     URINALYSIS, MACRO/MICRO[[id number]]      Normal              Final result                 Please view results for these tests on the individual orders.   LACTIC ACID LEVEL W/ REFLEX FOR LEVEL >2.0   H & H     CTA CHEST W CT ABDOMEN PELVIS W IV CONTRAST   Final Result by Edi, Radresults In (10/14 1956)   NO CT EVIDENCE OF PULMONARY EMBOLISM      POSTERIOR BASILAR RIGHT AND LEFT LUNG INFILTRATIVE AND/OR ATELECTATIC CHANGES.      MILDLY DILATED COLON WITH MOSTLY AIR. THIS COULD BE DUE TO ILEUS.                  Radiologist location ID: [id number]         XR AP MOBILE CHEST   Final Result by Edi, Radresults In (10/14 1741)   NO ACUTE FINDINGS.            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 Iron  def. Pancreatis dehydration  pt has new elevation in  liver enzymes. Hgb9.1, wbc 3.5 poss bil infiltrate on cta no pe. No st elevation. Pt feels weak nauseated and  interm continued pain left .  Was admitted similar presentation end of last mo for 6 days. 2203 Hgb on venous gas 6.7.presented to dr. Janita. Dr. Bonna assumed care.          Medications Ordered/Administered in the ED   heparin  flush PF (HEPLOCK) 100 units/mL injection (has no administration in time range)   doxycycline hyclate 100 mg in NS 100 mL IVPB with adaptor (has no administration in time range)   cefTRIAXone (ROCEPHIN) 2 g in NS 50 mL IVPB with adaptor (has no administration in time range)   prochlorperazine  (COMPAZINE ) 5 mg/mL injection (5 mg Intravenous Given 12/31/23 1800)   buprenorphine  (BUPRENEX ) 0.3 mg/mL injection (0.15 mg Intravenous Given 12/31/23 1800)   iopamidol (ISOVUE-370) 76% infusion (100 mL Intravenous Given 12/31/23 1941)   prochlorperazine  (COMPAZINE ) 5 mg/mL injection (2.5 mg Intravenous Given 12/31/23 2017)   buprenorphine  (BUPRENEX ) 0.3 mg/mL injection (0.15 mg Intravenous Given 12/31/23 2012)     Clinical Impression   Chest pain, unspecified type (Primary)   Weakness   Pedal edema   LUQ abdominal pain   Anemia, unspecified type   Pneumonia   Dyspnea, unspecified type       Disposition: Admitted

## 2023-12-31 NOTE — ED Nurses Note (Addendum)
 Patient discharged home with family.  AVS reviewed with patient/care giver.  A written copy of the AVS and discharge instructions was given to the patient/care giver. Scripts called to pharmacy of choice. Questions sufficiently answered as needed.  Patient/care giver encouraged to follow up with PCP as indicated.  In the event of an emergency, patient/care giver instructed to call 911 or go to the nearest emergency room. Pt ambulated off unit with spouse, steady gait noted

## 2023-12-31 NOTE — Respiratory Therapy (Signed)
 Latest Reference Range & Units 12/31/23 21:48   O2CT % 5.5   HEMATOCRITRT 37 - 50 % 20 (L)   O2 SATURATION (VENOUS) 40.0 - 85.0 % 61.8   %FIO2 % 21.0   PH 7.32 - 7.43  7.35   PCO2 41 - 51 mm/Hg 45   PO2 35 - 50 mm/Hg 34   BICARBONATE 22.0 - 29.0 mmol/L 23.8   BASE DEFICIT 0.0 - 3.0 mmol/L 0.8   CARBOXYHEMOGLOBIN <=3.0 % 2.0   HEMOGLOBIN 12.0 - 18.0 g/dL 6.7 (LL)   MET-HEMOGLOBIN <=1.5 % 0.7   O2HB 40.0 - 80.0 % 58.3   SODIUM 136 - 145 mmol/L 137   LACTATE <=1.9 mmol/L 0.7   CHLORIDE 98 - 107 mmol/L 108 (H)   GLUCOSE 65 - 125 mg/dL 83   IONIZED CALCIUM 8.84 - 1.33 mmol/L 1.23   WHOLE BLOOD K+ 3.5 - 5.1 mmol/L 4.5   (LL): Data is critically low  (L): Data is abnormally low  (H): Data is abnormally high    VBG results and critical given to Elesa Holts, FNP

## 2023-12-31 NOTE — ED Nurses Note (Signed)
 Report given to Sirena, Charity fundraiser.

## 2023-12-31 NOTE — ED Nurses Note (Addendum)
 MD at bedside. Informed pt [facility]  hospitalist didn't find reason for admission. Pt and spouse states they are ok with follow up outpatient treatment. Pt states she is sch. For iron  infusion this Thursday.

## 2023-12-31 NOTE — ED Nurses Note (Signed)
 Pt sitting up at bedside eating meal provided by spouse

## 2023-12-31 NOTE — ED Nurses Note (Signed)
 Patient c/o generalized weakness for several days. Patient states she has history of low iron , low WBC, and low RBC. Also c/o bilateral leg and bilateral ankle edema. Patient placed on cardiac monitor. Patient refused to be placed in gown. Family x1 at bedside. Patient denies any needs or concerns at this time.

## 2023-12-31 NOTE — Discharge Instructions (Signed)
 Follow-up as recommended for IV infusion and reexamination

## 2023-12-31 NOTE — ED Nurses Note (Signed)
 Provider at bedside, going over labs and findings. Pt reports chills,  nightly cough, and intermittent fevers.

## 2023-12-31 NOTE — ED Nurses Note (Signed)
 Pt refused to change into hospital gown, states she is sending spouse home for personal gown.

## 2024-01-01 ENCOUNTER — Ambulatory Visit (INDEPENDENT_AMBULATORY_CARE_PROVIDER_SITE_OTHER): Payer: Self-pay | Admitting: NURSE PRACTITIONER

## 2024-01-01 ENCOUNTER — Inpatient Hospital Stay
Admission: EM | Admit: 2024-01-01 | Discharge: 2024-01-03 | DRG: 189 | Disposition: A | Discharge status: Home / Self Care

## 2024-01-01 ENCOUNTER — Ambulatory Visit (INDEPENDENT_AMBULATORY_CARE_PROVIDER_SITE_OTHER): Payer: Self-pay

## 2024-01-01 ENCOUNTER — Emergency Department (HOSPITAL_BASED_OUTPATIENT_CLINIC_OR_DEPARTMENT_OTHER)

## 2024-01-01 ENCOUNTER — Encounter (HOSPITAL_BASED_OUTPATIENT_CLINIC_OR_DEPARTMENT_OTHER): Payer: Self-pay

## 2024-01-01 DIAGNOSIS — J440 Chronic obstructive pulmonary disease with acute lower respiratory infection: Secondary | ICD-10-CM | POA: Diagnosis present

## 2024-01-01 DIAGNOSIS — I110 Hypertensive heart disease with heart failure: Secondary | ICD-10-CM | POA: Diagnosis present

## 2024-01-01 DIAGNOSIS — I509 Heart failure, unspecified: Secondary | ICD-10-CM

## 2024-01-01 DIAGNOSIS — J189 Pneumonia, unspecified organism: Secondary | ICD-10-CM | POA: Diagnosis present

## 2024-01-01 DIAGNOSIS — E785 Hyperlipidemia, unspecified: Secondary | ICD-10-CM | POA: Diagnosis present

## 2024-01-01 DIAGNOSIS — Z7984 Long term (current) use of oral hypoglycemic drugs: Secondary | ICD-10-CM

## 2024-01-01 DIAGNOSIS — D649 Anemia, unspecified: Secondary | ICD-10-CM

## 2024-01-01 DIAGNOSIS — Z7985 Long-term (current) use of injectable non-insulin antidiabetic drugs: Secondary | ICD-10-CM

## 2024-01-01 DIAGNOSIS — Z7982 Long term (current) use of aspirin: Secondary | ICD-10-CM

## 2024-01-01 DIAGNOSIS — J9811 Atelectasis: Secondary | ICD-10-CM

## 2024-01-01 DIAGNOSIS — I5033 Acute on chronic diastolic (congestive) heart failure: Secondary | ICD-10-CM | POA: Diagnosis present

## 2024-01-01 DIAGNOSIS — J9601 Acute respiratory failure with hypoxia: Principal | ICD-10-CM | POA: Diagnosis present

## 2024-01-01 DIAGNOSIS — M797 Fibromyalgia: Secondary | ICD-10-CM | POA: Diagnosis present

## 2024-01-01 DIAGNOSIS — R0902 Hypoxemia: Principal | ICD-10-CM

## 2024-01-01 DIAGNOSIS — Z7902 Long term (current) use of antithrombotics/antiplatelets: Secondary | ICD-10-CM

## 2024-01-01 DIAGNOSIS — D509 Iron deficiency anemia, unspecified: Secondary | ICD-10-CM | POA: Diagnosis present

## 2024-01-01 DIAGNOSIS — Z79899 Other long term (current) drug therapy: Secondary | ICD-10-CM

## 2024-01-01 DIAGNOSIS — N179 Acute kidney failure, unspecified: Secondary | ICD-10-CM | POA: Diagnosis present

## 2024-01-01 DIAGNOSIS — R531 Weakness: Secondary | ICD-10-CM

## 2024-01-01 LAB — CBC WITH DIFF
BASOPHIL #: 0.01 x10ˆ3/uL (ref 0.00–0.10)
BASOPHIL %: 0 % (ref 0–1)
EOSINOPHIL #: 0.1 x10ˆ3/uL (ref 0.00–0.50)
EOSINOPHIL %: 4 % (ref 1–7)
HCT: 27.5 % — ABNORMAL LOW (ref 31.2–41.9)
HGB: 8.5 g/dL — ABNORMAL LOW (ref 10.9–14.3)
LYMPHOCYTE #: 0.77 x10ˆ3/uL — ABNORMAL LOW (ref 1.10–3.10)
LYMPHOCYTE %: 27 % (ref 16–46)
MCH: 25.2 pg (ref 24.7–32.8)
MCHC: 31.1 g/dL — ABNORMAL LOW (ref 32.3–35.6)
MCV: 81 fL (ref 75.5–95.3)
MONOCYTE #: 0.27 x10ˆ3/uL (ref 0.20–0.90)
MONOCYTE %: 10 % (ref 4–11)
MPV: 8.3 fL (ref 7.9–10.8)
NEUTROPHIL #: 1.66 x10ˆ3/uL — ABNORMAL LOW (ref 1.90–8.20)
NEUTROPHIL %: 59 % (ref 43–77)
PLATELETS: 202 x10ˆ3/uL (ref 140–440)
RBC: 3.39 x10ˆ6/uL — ABNORMAL LOW (ref 3.63–4.92)
RDW: 23.5 % — ABNORMAL HIGH (ref 12.3–17.7)
WBC: 2.8 x10ˆ3/uL — ABNORMAL LOW (ref 3.8–11.8)

## 2024-01-01 LAB — COMPREHENSIVE METABOLIC PANEL, NON-FASTING
ALBUMIN/GLOBULIN RATIO: 0.9 (ref 0.8–1.4)
ALBUMIN: 3 g/dL — ABNORMAL LOW (ref 3.4–5.0)
ALKALINE PHOSPHATASE: 98 U/L (ref 46–116)
ALT (SGPT): 133 U/L — ABNORMAL HIGH (ref <=78)
ANION GAP: 6 mmol/L (ref 4–13)
AST (SGOT): 84 U/L — ABNORMAL HIGH (ref 15–37)
BILIRUBIN TOTAL: 0.2 mg/dL (ref 0.2–1.0)
BUN/CREA RATIO: 10
BUN: 11 mg/dL (ref 7–18)
CALCIUM, CORRECTED: 9.1 mg/dL
CALCIUM: 8.3 mg/dL — ABNORMAL LOW (ref 8.5–10.1)
CHLORIDE: 108 mmol/L — ABNORMAL HIGH (ref 98–107)
CO2 TOTAL: 25 mmol/L (ref 21–32)
CREATININE: 1.12 mg/dL — ABNORMAL HIGH (ref 0.55–1.02)
ESTIMATED GFR: 56 mL/min/1.73mˆ2 — ABNORMAL LOW (ref >59)
GLOBULIN: 3.3
GLUCOSE: 123 mg/dL — ABNORMAL HIGH (ref 74–106)
OSMOLALITY, CALCULATED: 278 mosm/kg (ref 270–290)
POTASSIUM: 3.7 mmol/L (ref 3.5–5.1)
PROTEIN TOTAL: 6.3 g/dL — ABNORMAL LOW (ref 6.4–8.2)
SODIUM: 139 mmol/L (ref 136–145)

## 2024-01-01 LAB — URINALYSIS, MACRO/MICRO
BILIRUBIN: NEGATIVE mg/dL
BLOOD: NEGATIVE mg/dL
GLUCOSE: NEGATIVE mg/dL
KETONES: NEGATIVE mg/dL
LEUKOCYTES: NEGATIVE WBCs/uL
NITRITE: NEGATIVE
PH: 6 (ref 4.6–8.0)
PROTEIN: NEGATIVE mg/dL
SPECIFIC GRAVITY: 1.01 (ref 1.003–1.035)
UROBILINOGEN: 0.2 mg/dL (ref 0.2–1.0)

## 2024-01-01 LAB — SCAN DIFFERENTIAL
PLATELET MORPHOLOGY COMMENT: NORMAL
SCHISTOCYTES: ABSENT

## 2024-01-01 LAB — BLOOD GAS W/ CO-OX, LYTES, LACTATE REFLEX
%FIO2 (VENOUS): 21 %
BASE EXCESS: 1.2 mmol/L (ref 0.0–3.0)
BICARBONATE (VENOUS): 25.3 mmol/L (ref 22.0–29.0)
CARBOXYHEMOGLOBIN: 2.2 % (ref <=3.0)
CHLORIDE: 108 mmol/L — ABNORMAL HIGH (ref 98–107)
GLUCOSE: 78 mg/dL (ref 65–125)
HEMATOCRITRT: 26 % — ABNORMAL LOW (ref 37–50)
HEMOGLOBIN: 8.5 g/dL — ABNORMAL LOW (ref 12.0–18.0)
IONIZED CALCIUM: 1.24 mmol/L (ref 1.15–1.33)
LACTATE: 0.6 mmol/L (ref <=1.9)
MET-HEMOGLOBIN: 1.1 % (ref <=1.5)
O2 SATURATION (VENOUS): 64 % (ref 40.0–85.0)
O2CT: 7.3 %
OXYHEMOGLOBIN: 61 % (ref 40.0–80.0)
PCO2 (VENOUS): 45 mmHg (ref 41–51)
PH (VENOUS): 7.38 (ref 7.32–7.43)
PO2 (VENOUS): 34 mmHg (ref 35–50)
SODIUM: 137 mmol/L (ref 136–145)
WHOLE BLOOD POTASSIUM: 4.3 mmol/L (ref 3.5–5.1)

## 2024-01-01 LAB — SPUTUM SCREEN

## 2024-01-01 LAB — FERRITIN: FERRITIN: 215 ng/mL (ref 11–307)

## 2024-01-01 LAB — COOMBS, DIRECT: DAT POLYSPECIFIC: NEGATIVE

## 2024-01-01 LAB — RETICULOCYTE COUNT
IMMATURE RETIC FRACTION: 0.45 (ref 0.26–0.52)
RETICULOCYTE % AUTOMATED: 2.17 % (ref 0.51–2.17)
RETICULOCYTES COUNT # AUTOMATED: 0.0761 10(6)/uL (ref 0.0230–0.0935)

## 2024-01-01 LAB — POC BLOOD GLUCOSE (RESULTS): GLUCOSE, POC: 131 mg/dL — ABNORMAL HIGH (ref 70–100)

## 2024-01-01 LAB — IRON TRANSFERRIN AND TIBC
IRON (TRANSFERRIN) SATURATION: 14 % — ABNORMAL LOW (ref 15–50)
IRON: 46 ug/dL — ABNORMAL LOW (ref 50–212)
TOTAL IRON BINDING CAPACITY: 333 ug/dL (ref 250–450)
TRANSFERRIN: 238 mg/dL (ref 203–362)
UIBC: 287 ug/dL (ref 130–375)

## 2024-01-01 LAB — B-TYPE NATRIURETIC PEPTIDE (BNP),PLASMA: BNP: 21 pg/mL (ref 1–100)

## 2024-01-01 LAB — LDH: LDH: 252 U/L (ref 140–271)

## 2024-01-01 MED ORDER — SODIUM CHLORIDE 0.9 % INTRAVENOUS SOLUTION
INTRAVENOUS | Status: AC
Start: 2024-01-01 — End: 2024-01-01
  Filled 2024-01-01: qty 50

## 2024-01-01 MED ORDER — CYCLOBENZAPRINE 10 MG TABLET
10.0000 mg | ORAL_TABLET | ORAL | Status: AC
Start: 2024-01-01 — End: 2024-01-01
  Administered 2024-01-01: 10 mg via ORAL

## 2024-01-01 MED ORDER — OXYCODONE-ACETAMINOPHEN 5 MG-325 MG TABLET
1.0000 | ORAL_TABLET | Freq: Four times a day (QID) | ORAL | Status: DC | PRN
Start: 2024-01-01 — End: 2024-01-03
  Administered 2024-01-01 – 2024-01-03 (×6): 1 via ORAL
  Filled 2024-01-01 (×6): qty 1

## 2024-01-01 MED ORDER — SODIUM CHLORIDE 0.9 % INTRAVENOUS SOLUTION
2.0000 g | INTRAVENOUS | Status: AC
Start: 2024-01-01 — End: 2024-01-01
  Administered 2024-01-01: 0 g via INTRAVENOUS
  Administered 2024-01-01: 2 g via INTRAVENOUS

## 2024-01-01 MED ORDER — ATORVASTATIN 40 MG TABLET
80.0000 mg | ORAL_TABLET | Freq: Every day | ORAL | Status: DC
Start: 2024-01-01 — End: 2024-01-03
  Administered 2024-01-01 – 2024-01-03 (×3): 80 mg via ORAL
  Filled 2024-01-01 (×3): qty 2

## 2024-01-01 MED ORDER — SODIUM CHLORIDE 0.9 % INTRAVENOUS SOLUTION
100.0000 mg | INTRAVENOUS | Status: AC
Start: 2024-01-01 — End: 2024-01-01
  Administered 2024-01-01: 0 mg via INTRAVENOUS
  Administered 2024-01-01: 100 mg via INTRAVENOUS

## 2024-01-01 MED ORDER — INSULIN LISPRO 100 UNIT/ML SUB-Q SSIP VIAL
3.0000 [IU] | INJECTION | Freq: Four times a day (QID) | SUBCUTANEOUS | Status: DC
Start: 2024-01-01 — End: 2024-01-03
  Administered 2024-01-01 – 2024-01-02 (×2): 0 [IU] via SUBCUTANEOUS
  Administered 2024-01-02: 5 [IU] via SUBCUTANEOUS
  Administered 2024-01-02 – 2024-01-03 (×4): 0 [IU] via SUBCUTANEOUS
  Filled 2024-01-01: qty 5

## 2024-01-01 MED ORDER — QUETIAPINE 25 MG TABLET
50.0000 mg | ORAL_TABLET | Freq: Every evening | ORAL | Status: DC
Start: 2024-01-01 — End: 2024-01-03
  Administered 2024-01-01 – 2024-01-02 (×2): 50 mg via ORAL
  Filled 2024-01-01 (×2): qty 2

## 2024-01-01 MED ORDER — GLUCAGON HCL 1 MG/ML SOLUTION FOR INJECTION
1.0000 mg | Freq: Once | INTRAMUSCULAR | Status: DC | PRN
Start: 2024-01-01 — End: 2024-01-03

## 2024-01-01 MED ORDER — PROCHLORPERAZINE EDISYLATE 10 MG/2 ML (5 MG/ML) INJECTION SOLUTION
INTRAMUSCULAR | Status: AC
Start: 2024-01-01 — End: 2024-01-01
  Filled 2024-01-01: qty 2

## 2024-01-01 MED ORDER — DEXTROSE 50 % IN WATER (D50W) INTRAVENOUS SYRINGE
12.5000 g | INJECTION | INTRAVENOUS | Status: DC | PRN
Start: 2024-01-01 — End: 2024-01-03

## 2024-01-01 MED ORDER — BUPRENORPHINE HCL 0.3 MG/ML INJECTION SOLUTION
0.1500 mg | INTRAMUSCULAR | Status: AC
Start: 2024-01-01 — End: 2024-01-01
  Administered 2024-01-01: 0.15 mg via INTRAVENOUS

## 2024-01-01 MED ORDER — SODIUM CHLORIDE 0.9 % INTRAVENOUS SOLUTION
2.0000 g | INTRAVENOUS | Status: DC
Start: 2024-01-02 — End: 2024-01-03
  Administered 2024-01-02: 0 g via INTRAVENOUS
  Administered 2024-01-02: 2 g via INTRAVENOUS
  Administered 2024-01-03: 0 g via INTRAVENOUS
  Filled 2024-01-01: qty 20

## 2024-01-01 MED ORDER — PREGABALIN 50 MG CAPSULE
50.0000 mg | ORAL_CAPSULE | Freq: Every evening | ORAL | Status: DC
Start: 2024-01-01 — End: 2024-01-03
  Administered 2024-01-01 – 2024-01-02 (×2): 50 mg via ORAL
  Filled 2024-01-01 (×2): qty 1

## 2024-01-01 MED ORDER — ACETAMINOPHEN 1,000 MG/100 ML (10 MG/ML) INTRAVENOUS SOLUTION
1000.0000 mg | INTRAVENOUS | Status: AC
Start: 2024-01-01 — End: 2024-01-01
  Administered 2024-01-01: 0 mg via INTRAVENOUS
  Administered 2024-01-01: 1000 mg via INTRAVENOUS

## 2024-01-01 MED ORDER — CEFTRIAXONE 2 GRAM SOLUTION FOR INJECTION
INTRAMUSCULAR | Status: AC
Start: 2024-01-01 — End: 2024-01-01
  Filled 2024-01-01: qty 20

## 2024-01-01 MED ORDER — DULOXETINE 30 MG CAPSULE,DELAYED RELEASE
30.0000 mg | DELAYED_RELEASE_CAPSULE | Freq: Two times a day (BID) | ORAL | Status: DC
Start: 2024-01-01 — End: 2024-01-03
  Administered 2024-01-01 – 2024-01-03 (×4): 30 mg via ORAL
  Filled 2024-01-01 (×4): qty 1

## 2024-01-01 MED ORDER — DOXYCYCLINE HYCLATE 100 MG INTRAVENOUS POWDER FOR SOLUTION
INTRAVENOUS | Status: AC
Start: 2024-01-01 — End: 2024-01-01
  Filled 2024-01-01: qty 10

## 2024-01-01 MED ORDER — SODIUM CHLORIDE 0.9 % INTRAVENOUS SOLUTION
INTRAVENOUS | Status: AC
Start: 2024-01-01 — End: 2024-01-01
  Filled 2024-01-01: qty 100

## 2024-01-01 MED ORDER — IPRATROPIUM 0.5 MG-ALBUTEROL 3 MG (2.5 MG BASE)/3 ML NEBULIZATION SOLN
3.0000 mL | INHALATION_SOLUTION | Freq: Four times a day (QID) | RESPIRATORY_TRACT | Status: DC
Start: 2024-01-01 — End: 2024-01-03
  Administered 2024-01-01: 3 mL via RESPIRATORY_TRACT
  Administered 2024-01-02: 0 mL via RESPIRATORY_TRACT
  Administered 2024-01-02 – 2024-01-03 (×4): 3 mL via RESPIRATORY_TRACT

## 2024-01-01 MED ORDER — PANTOPRAZOLE 40 MG TABLET,DELAYED RELEASE
40.0000 mg | DELAYED_RELEASE_TABLET | Freq: Every day | ORAL | Status: DC
Start: 2024-01-02 — End: 2024-01-03
  Administered 2024-01-02 – 2024-01-03 (×2): 0 mg via ORAL
  Filled 2024-01-01 (×2): qty 1

## 2024-01-01 MED ORDER — CHOLECALCIFEROL (VITAMIN D3) 25 MCG (1,000 UNIT) TABLET
1000.0000 [IU] | ORAL_TABLET | Freq: Every day | ORAL | Status: DC
Start: 2024-01-01 — End: 2024-01-03
  Administered 2024-01-01 – 2024-01-03 (×3): 1000 [IU] via ORAL
  Filled 2024-01-01 (×3): qty 1

## 2024-01-01 MED ORDER — DIVALPROEX ER 250 MG TABLET,EXTENDED RELEASE 24 HR
250.0000 mg | ORAL_TABLET | Freq: Every day | ORAL | Status: DC
Start: 2024-01-02 — End: 2024-01-03
  Administered 2024-01-02 – 2024-01-03 (×2): 250 mg via ORAL
  Filled 2024-01-01 (×2): qty 1

## 2024-01-01 MED ORDER — METOPROLOL SUCCINATE ER 50 MG TABLET,EXTENDED RELEASE 24 HR
50.0000 mg | ORAL_TABLET | Freq: Every day | ORAL | Status: DC
Start: 2024-01-01 — End: 2024-01-03
  Administered 2024-01-01 – 2024-01-03 (×3): 50 mg via ORAL
  Filled 2024-01-01 (×3): qty 1

## 2024-01-01 MED ORDER — PANTOPRAZOLE 40 MG TABLET,DELAYED RELEASE
40.0000 mg | DELAYED_RELEASE_TABLET | Freq: Every day | ORAL | Status: DC
Start: 2024-01-02 — End: 2024-01-03
  Administered 2024-01-02 – 2024-01-03 (×2): 40 mg via ORAL
  Filled 2024-01-01: qty 1

## 2024-01-01 MED ORDER — BUPRENORPHINE HCL 0.3 MG/ML INJECTION SOLUTION
INTRAMUSCULAR | Status: AC
Start: 2024-01-01 — End: 2024-01-01
  Filled 2024-01-01: qty 1

## 2024-01-01 MED ORDER — ACETAMINOPHEN 1,000 MG/100 ML (10 MG/ML) INTRAVENOUS SOLUTION
INTRAVENOUS | Status: AC
Start: 2024-01-01 — End: 2024-01-01
  Filled 2024-01-01: qty 100

## 2024-01-01 MED ORDER — ASPIRIN 81 MG CHEWABLE TABLET
81.0000 mg | CHEWABLE_TABLET | Freq: Every day | ORAL | Status: DC
Start: 2024-01-01 — End: 2024-01-03
  Administered 2024-01-01 – 2024-01-03 (×3): 81 mg via ORAL
  Filled 2024-01-01 (×3): qty 1

## 2024-01-01 MED ORDER — PROCHLORPERAZINE EDISYLATE 10 MG/2 ML (5 MG/ML) INJECTION SOLUTION
10.0000 mg | INTRAMUSCULAR | Status: AC
Start: 2024-01-01 — End: 2024-01-01
  Administered 2024-01-01: 10 mg via INTRAVENOUS

## 2024-01-01 MED ORDER — DEXTROSE 40 % ORAL GEL
15.0000 g | ORAL | Status: DC | PRN
Start: 2024-01-01 — End: 2024-01-03

## 2024-01-01 MED ORDER — DOXYCYCLINE HYCLATE 100 MG TABLET
100.0000 mg | ORAL_TABLET | Freq: Two times a day (BID) | ORAL | Status: DC
Start: 2024-01-01 — End: 2024-01-03
  Administered 2024-01-01 – 2024-01-03 (×4): 100 mg via ORAL
  Filled 2024-01-01 (×4): qty 1

## 2024-01-01 MED ORDER — PROCHLORPERAZINE EDISYLATE 10 MG/2 ML (5 MG/ML) INJECTION SOLUTION
10.0000 mg | Freq: Four times a day (QID) | INTRAMUSCULAR | Status: DC | PRN
Start: 2024-01-01 — End: 2024-01-03
  Administered 2024-01-01 – 2024-01-03 (×6): 10 mg via INTRAVENOUS
  Filled 2024-01-01 (×6): qty 2

## 2024-01-01 MED ORDER — HEPARIN (PORCINE) 5,000 UNIT/ML INJECTION SOLUTION
5000.0000 [IU] | Freq: Three times a day (TID) | INTRAMUSCULAR | Status: DC
Start: 2024-01-01 — End: 2024-01-03
  Administered 2024-01-01 – 2024-01-03 (×5): 5000 [IU] via SUBCUTANEOUS
  Filled 2024-01-01 (×5): qty 1

## 2024-01-01 MED ORDER — ROPINIROLE 1 MG TABLET
1.0000 mg | ORAL_TABLET | Freq: Three times a day (TID) | ORAL | Status: DC
Start: 2024-01-01 — End: 2024-01-03
  Administered 2024-01-01 – 2024-01-03 (×5): 1 mg via ORAL
  Filled 2024-01-01 (×5): qty 1

## 2024-01-01 MED ORDER — TRAZODONE 100 MG TABLET
100.0000 mg | ORAL_TABLET | Freq: Every evening | ORAL | Status: DC
Start: 2024-01-01 — End: 2024-01-03
  Administered 2024-01-01 – 2024-01-02 (×2): 100 mg via ORAL
  Filled 2024-01-01 (×2): qty 1

## 2024-01-01 MED ORDER — CLOPIDOGREL 75 MG TABLET
75.0000 mg | ORAL_TABLET | Freq: Every day | ORAL | Status: DC
Start: 2024-01-01 — End: 2024-01-03
  Administered 2024-01-01 – 2024-01-03 (×3): 75 mg via ORAL
  Filled 2024-01-01 (×3): qty 1

## 2024-01-01 MED ORDER — CYCLOBENZAPRINE 10 MG TABLET
ORAL_TABLET | ORAL | Status: AC
Start: 2024-01-01 — End: 2024-01-01
  Filled 2024-01-01: qty 1

## 2024-01-01 MED ORDER — SODIUM CHLORIDE 0.9 % INTRAVENOUS SOLUTION
200.0000 mg | Freq: Every day | INTRAVENOUS | Status: DC
Start: 2024-01-02 — End: 2024-01-03
  Administered 2024-01-02: 0 mg via INTRAVENOUS
  Administered 2024-01-02: 200 mg via INTRAVENOUS
  Administered 2024-01-03: 0 mg via INTRAVENOUS
  Administered 2024-01-03: 200 mg via INTRAVENOUS
  Filled 2024-01-01 (×4): qty 10

## 2024-01-01 MED ORDER — PROCHLORPERAZINE EDISYLATE 10 MG/2 ML (5 MG/ML) INJECTION SOLUTION
2.5000 mg | INTRAMUSCULAR | Status: AC
Start: 2024-01-01 — End: 2024-01-01
  Administered 2024-01-01: 2.5 mg via INTRAVENOUS

## 2024-01-01 NOTE — ED Nurses Note (Signed)
 After 10 minute recovery patient's oxygen increased to 100% on RA. Patient c/o weakness and feeling tired. Also c/o feeling hot. Provider notified.

## 2024-01-01 NOTE — ED Nurses Note (Signed)
[facility] here to transport patient at this time.

## 2024-01-01 NOTE — ED Nurses Note (Signed)
 Report called to Palacios at [facility]. Awaiting transport at this time.

## 2024-01-01 NOTE — Ancillary Notes (Signed)
 Latest Reference Range & Units 01/01/24 13:56   O2CT % 7.3   HEMATOCRITRT 37 - 50 % 26 (L)   O2 SATURATION (VENOUS) 40.0 - 85.0 % 64.0   %FIO2 % 21.0   PH 7.32 - 7.43  7.38   PCO2 41 - 51 mm/Hg 45   PO2 35 - 50 mm/Hg 34   BICARBONATE 22.0 - 29.0 mmol/L 25.3   BASE EXCESS 0.0 - 3.0 mmol/L 1.2   CARBOXYHEMOGLOBIN <=3.0 % 2.2   HEMOGLOBIN 12.0 - 18.0 g/dL 8.5 (L)   MET-HEMOGLOBIN <=1.5 % 1.1   O2HB 40.0 - 80.0 % 61.0   SODIUM 136 - 145 mmol/L 137   LACTATE <=1.9 mmol/L 0.6   CHLORIDE 98 - 107 mmol/L 108 (H)   GLUCOSE 65 - 125 mg/dL 78   IONIZED CALCIUM 8.84 - 1.33 mmol/L 1.24   WHOLE BLOOD K+ 3.5 - 5.1 mmol/L 4.3   (L): Data is abnormally low  (H): Data is abnormally high    ROOM AIR VBG

## 2024-01-01 NOTE — Progress Notes (Signed)
 1018am 01/01/24 called pt, she stated felt poorly during night was sob and legs hurt all night. Increased weakness. Advised if not better or worse to please return. Pt states she will be in here in an hour.

## 2024-01-01 NOTE — ED Nurses Note (Signed)
 Patient resting in bed with eyes closed and respirations WDL. Chest rise and fall equal. No signs of acute respiratory distress noted at this time. Family x1 remains at bedside.

## 2024-01-01 NOTE — ED Nurses Note (Signed)
 Patient c/o muscle spasms and requesting a muscle relaxer. Provider notified and new orders received.

## 2024-01-01 NOTE — ED Nurses Note (Signed)
 Patient c/o nausea and headache. Provider notified and new orders received.

## 2024-01-01 NOTE — ED Provider Notes (Signed)
 [facility], [place] - Emergency Department  ED Primary Note  History of Present Illness   Martha White is a 58 y.o. female who had concerns including Abdominal Pain and Leg Pain. Pt here last pm dx pneumonia anemia. Over night felt more sob and worse. Pt feels she is worse and not  better.   Review of Systems   Constitutional: No fever, chills + weakness   Skin: No rash or diaphoresis  HENT: No headaches, or congestion  Eyes: No vision changes or photophobia   Cardio: No chest pain, palpitations or leg swelling   Respiratory: + cough, wheezing  SOB  GI+ abd  pain , nausea, no  vomiting or stool changes  GU:  No dysuria, hematuria, or increased frequency  MSK: No muscle aches, joint or back pain  Neuro: No seizures, LOC, numbness, tingling, or focal weakness  Psychiatric: No depression, SI or substance abuse  All other systems reviewed and are negative.      Physical Exam   ED Triage Vitals [01/01/24 1243]   BP (Non-Invasive) (!) 152/84   Heart Rate 98   Respiratory Rate 18   Temperature 36.9 C (98.4 F)   SpO2 98 %   Weight 75.1 kg (165 lb 8 oz)   Height 1.511 m (4' 11.5)     Constitutional:  58 y.o. female who appears in no distress. Normal color, no cyanosis.   HENT:   Head: Normocephalic and atraumatic.   Mouth/Throat: Oropharynx is clear and dry.   Eyes: EOMI, PERRL   Neck: Trachea midline. Neck supple.  Cardiovascular: RRR, No murmurs, rubs or gallops. Intact distal pulses.  Pulmonary/Chest: BS equal bilaterally diminished bibasilar and posterior fields . No respiratory distress. No wheezes, rales or chest tenderness.   Abdominal: Bowel sounds present and normal. Abdomen soft, + luq  tenderness, no rebound and no guarding.  Back: No midline spinal tenderness, no paraspinal tenderness, no CVA tenderness.           Musculoskeletal: 2+ pedal np edema, no tenderness or deformity.  Skin: warm and dry. No rash, erythema, pallor or cyanosis  Psychiatric: normal mood and affect. Behavior is  normal.   Neurological: Patient keenly alert and responsive, easily able to raise eyebrows, facial muscles/expressions symmetric, speaking in fluent sentences, moving all extremities equally and fully, slow gait.   Patient Data   Labs Ordered/Reviewed   COMPREHENSIVE METABOLIC PANEL, NON-FASTING - Abnormal; Notable for the following components:       Result Value    CHLORIDE 108 (*)     CREATININE 1.12 (*)     ESTIMATED GFR 56 (*)     ALBUMIN 3.0 (*)     CALCIUM 8.3 (*)     GLUCOSE 123 (*)     ALT (SGPT) 133 (*)     AST (SGOT) 84 (*)     PROTEIN TOTAL 6.3 (*)     All other components within normal limits    Narrative:     Estimated Glomerular Filtration Rate (eGFR) is calculated using the CKD-EPI (2021) equation, intended for patients 31 years of age and older. If gender is not documented or unknown, there will be no eGFR calculation.   CBC WITH DIFF - Abnormal; Notable for the following components:    WBC 2.8 (*)     RBC 3.39 (*)     HGB 8.5 (*)     HCT 27.5 (*)     MCHC 31.1 (*)     RDW 23.5 (*)  NEUTROPHIL # 1.66 (*)     LYMPHOCYTE # 0.77 (*)     All other components within normal limits   BLOOD GAS W/ CO-OX, LYTES, LACTATE REFLEX - Abnormal; Notable for the following components:    HEMOGLOBIN 8.5 (*)     HEMATOCRITRT 26 (*)     CHLORIDE 108 (*)     All other components within normal limits    Narrative:     VBG DRAWN BY RN VIA PORT  ROOM AIR  Manufacturer does not recommend venous sample for assessment of patient oxygenation status. A reference range for pO2, Oxyhemoglobin and Oxygen Saturation is provided but abnormal results will not flag in Epic.   URINALYSIS, MACRO/MICRO - Normal   SPUTUM SCREEN   CBC/DIFF    Narrative:     The following orders were created for panel order CBC/DIFF.  Procedure                               Abnormality         Status                     ---------                               -----------         ------                     CBC WITH IPQQ[[id number]]                Abnormal             Edited Result - FINAL        Please view results for these tests on the individual orders.   URINALYSIS WITH REFLEX MICROSCOPIC AND CULTURE IF POSITIVE    Narrative:     The following orders were created for panel order URINALYSIS WITH REFLEX MICROSCOPIC AND CULTURE IF POSITIVE.  Procedure                               Abnormality         Status                     ---------                               -----------         ------                     URINALYSIS, MACRO/MICRO[[id number]]      Normal              Final result                 Please view results for these tests on the individual orders.   SCAN DIFFERENTIAL   RETICULOCYTE COUNT   FERRITIN   IRON  TRANSFERRIN AND TIBC   LDH   PNH WITH FLAER (HIGH SENSITIVITY)   COOMBS, DIRECT     XR CHEST AP   Final Result by Edi, Radresults In (10/15 1318)   Bilateral atelectasis slightly increased from yesterday            Radiologist location ID: [id number]  Medical Decision Making   Diff dx of  anemia, pneumonia hypoxia.    Pt was ambulated with hypoxia tachy weakness.   Pt placed on O2 at 2 liters. Resting sats are good. Ambulatory rm air pt becomes dyspneic and hypoxic . Dr .lendia accepted pt at [facility].     Medications Ordered/Administered in the ED   cefTRIAXone (ROCEPHIN) 2 g in NS 50 mL IVPB with adaptor (2 g Intravenous New Bag/New Syringe 01/01/24 1416)   buprenorphine  (BUPRENEX ) 0.3 mg/mL injection (0.15 mg Intravenous Given 01/01/24 1416)   prochlorperazine  (COMPAZINE ) 5 mg/mL injection (2.5 mg Intravenous Given 01/01/24 1415)     Clinical Impression   Hypoxic - with exertion (Primary)   Weakness   Anemia, unspecified type       Disposition: Admitted

## 2024-01-01 NOTE — ED Nurses Note (Signed)
 Patient and her husband eating at this time. Patient denies any other needs or concerns. Call bell within reach.

## 2024-01-01 NOTE — ED Nurses Note (Signed)
 Patient c/o generalized weakness, generalized pain all over, lethargy, SOB, and non-productive cough x3 weeks. Patient also c/o left sided abdominal pain x3 days. Patient alert and oriented x4. Patient placed on cardiac monitor and call bell within reach.

## 2024-01-01 NOTE — Ancillary Notes (Signed)
 VBG DRAWN VIA PT.S PORT BY RN. NO MEDS RUNNING THRU PORT AT THIS TIME, 6 ML WASTE DRAWN PRIOR TO VBG BEING OBTAINED. PT. ON ROOM AIR.

## 2024-01-01 NOTE — Ancillary Notes (Signed)
 PT. INSTRUCTED ON I.S. AT THIS TIME  PT. HAD FAIR EFFORT

## 2024-01-01 NOTE — ED Nurses Note (Signed)
 PT placed on O2 at 2 liters via nasal cannula

## 2024-01-01 NOTE — ED Nurses Note (Signed)
 While ambulating patient per order patient's oxygen dropped to 78% on RA and heart rate was 99. Patient states she feels very weak and SOB. Patient able to ambulate back to room with assistance x1.

## 2024-01-01 NOTE — H&P (Signed)
 [facility]  Admission H&P      Date of Service:  01/01/2024  Wilson Prime y.o. female  Date of Admission:  01/01/2024  Date of Birth:  Sep 21, 1965      Chief Complaint:  abd pain    HPI: Martha White is a 58 y.o., Black or [place] American female with past medical history of Chronic Obstructive Pulmonary Disease, asthma not on any home oxygen, diabetes, fibromyalgia, gastroparesis, hypertension, severe iron -deficiency anemia who came to [facility] ER due to abdominal pain.  Also she was found to be hypoxic, CT chest showed findings concerning for pneumonia.  Patient was placed on 2 L nasal cannula and transferred to [facility] for further management.    Patient seen and examined at bedside, reports low-grade fevers, cough.  Leg pains likely in the setting of fibromyalgia.    Of note patient was admitted to [facility] last month was found to be anemic and had severe iron -deficiency anemia.  EGD was done which showed superficial ulcers.  Colonoscopy could not be done due to poor prep and she was planned to do colonoscopy outpatient.    History:    Past Medical:    Past Medical History:   Diagnosis Date    Arthropathy     Asthma     Carpal tunnel syndrome     Carpal tunnel syndrome     Congestive heart failure     COPD (chronic obstructive pulmonary disease)     Depression     Diabetes mellitus, type 2     Fibromyalgia     Gastroparesis     Headache     HTN (hypertension)     Lumbar herniated disc     Neuropathy (CMS HCC)     Restless legs syndrome (RLS)     Thyroid  disease     TIA (transient ischemic attack)     TIA (transient ischemic attack)      Past Surgical:    Past Surgical History:   Procedure Laterality Date    HX CHOLECYSTECTOMY      HX HERNIA REPAIR      HX HERNIA REPAIR      HX HYSTERECTOMY      HX ROTATOR CUFF REPAIR      STOMACH SURGERY      X4     Family:    Family Medical History:       Problem Relation (Age of Onset)    Alzheimer's/Dementia Maternal Grandmother    Heart Attack  Maternal Grandfather    Hypertension (High Blood Pressure) Mother, Maternal Grandmother, Maternal Grandfather    Pancreatic Cancer Maternal Grandmother          Social:   reports that she has never smoked. She has never used smokeless tobacco. She reports that she does not drink alcohol  and does not use drugs.    Allergies[1]  Medications Prior to Admission       Prescriptions    albuterol  sulfate (PROVENTIL  OR VENTOLIN  OR PROAIR ) 90 mcg/actuation Inhalation oral inhaler    Take 1-2 Puffs by inhalation Every 6 hours as needed    amLODIPine  (NORVASC ) 10 mg Oral Tablet    Take 1 Tablet (10 mg total) by mouth Once a day    aspirin  (ECOTRIN) 81 mg Oral Tablet, Delayed Release (E.C.)    Take 1 Tablet (81 mg total) by mouth Once a day    atorvastatin  calcium (ATORVASTATIN  ORAL)    Take 80 mg by mouth Once a day  bethanechol chloride (URECHOLINE) 5 mg Oral Tablet    bethanechol chloride 5 mg tablet   TAKE ONE TABLET BY MOUTH ONCE EVERY DAY    chlorzoxazone  (PARAFON  FORTE) 500 mg Oral Tablet    Take 1 Tablet (500 mg total) by mouth Every 6 hours as needed for Muscle spasms    clopidogreL (PLAVIX) 75 mg Oral Tablet    Take 1 Tablet (75 mg total) by mouth Once a day    cyanocobalamin (VITAMIN B12) 1,000 mcg/mL Injection Solution    Inject 1 mL (1,000 mcg total) under the skin Every 14 days    cyclobenzaprine  (FLEXERIL ) 10 mg Oral Tablet    Take 0.5 Tablets (5 mg total) by mouth Three times a day    cyclobenzaprine  (FLEXERIL ) 5 mg Oral Tablet    cyclobenzaprine  5 mg tablet   Take 1 tablet twice a day by oral route for 30 days.    divalproex (DEPAKOTE ER) 250 mg Oral Tablet Sustained Release 24 hr    Take 1 Tablet (250 mg total) by mouth    docusate sodium (COLACE) 100 mg Oral Capsule    100 mg by oral route.    doxycycline hyclate (VIBRAMYCIN) 100 mg Oral Capsule    Take 1 Capsule (100 mg total) by mouth Twice daily for 10 days    dulaglutide (TRULICITY) 0.75 mg/0.5 mL Subcutaneous Pen Injector    Inject 0.5 mL (0.75 mg  total) under the skin Every 7 days    DULoxetine  (CYMBALTA  DR) 30 mg Oral Capsule, Delayed Release(E.C.)    Take 1 Capsule (30 mg total) by mouth Twice daily    ergocalciferol , vitamin D2, (DRISDOL) 1,250 mcg (50,000 unit) Oral Capsule    Take 1 Capsule (50,000 Units total) by mouth Every 7 days    famotidine  (PEPCID ) 40 mg Oral Tablet    Take 1 Tablet (40 mg total) by mouth Twice daily for 30 days    furosemide (LASIX) 20 mg Oral Tablet    Take 1 Tablet (20 mg total) by mouth Once per day as needed    gabapentin (NEURONTIN) 400 mg Oral Capsule    Take 1 Capsule (400 mg total) by mouth Three times a day    hydrOXYzine pamoate (VISTARIL) 25 mg Oral Capsule    Take 1 Capsule (25 mg total) by mouth Every night    insulin  detemir U-100 (LEVEMIR FLEXPEN) 100 unit/mL (3 mL) Subcutaneous Insulin  Pen    Inject 60 Units under the skin Once per day as needed (SLIDING SCALE)    insulin  lispro (HUMALOG  KWIKPEN INSULIN  SUBQ)    Inject under the skin 7-20 units tid before meals    ipratropium/albuterol  sulfate (COMBIVENT INHL)    Take 1 Puff by inhalation Four times a day as needed    lidocaine -prilocaine (EMLA) 2.5-2.5 % Cream    Apply topically to Port-A-Cath site 30 minutes prior to excessive port.    lisinopriL (PRINIVIL) 20 mg Oral Tablet    Take 1 Tablet (20 mg total) by mouth Once a day    loratadine (CLARITIN) 10 mg Oral Tablet    Take 1 Tablet (10 mg total) by mouth Once per day as needed    magnesium  chloride (SLOW-MAG) 64 mg Oral Tablet, Delayed Release (E.C.)    Take 1 Tablet (64 mg total) by mouth Daily Slow mag 71.5 mg-119 mg days 2 tablets daily    MetFORMIN (GLUCOPHAGE) 1,000 mg Oral Tablet    Take 1 Tablet (1,000 mg total) by mouth Every morning  with breakfast    metoclopramide  (REGLAN ) 100 mcg/mL Oral Solution    Take 5 mL (500 mcg total) by mouth Four times a day - before meals and bedtime    metoprolol  succinate (TOPROL -XL) 50 mg Oral Tablet Sustained Release 24 hr    Take 1 Tablet (50 mg total) by mouth Once  a day    omeprazole (PRILOSEC) 40 mg Oral Capsule, Delayed Release(E.C.)    Take 1 Capsule (40 mg total) by mouth Once a day    ondansetron  (ZOFRAN  ODT) 8 mg Oral Tablet, Rapid Dissolve    Take 1 Tablet (8 mg total) by mouth Every 8 hours as needed for Nausea/Vomiting    pregabalin  (LYRICA ) 50 mg Oral Capsule    Take 1 Capsule (50 mg total) by mouth Every night    prochlorperazine  (COMPAZINE ) 10 mg Oral Tablet    Take 1 Tablet (10 mg total) by mouth Four times a day as needed for Nausea/Vomiting For use when she can take oral.    promethazine  (PHENERGAN ) 25 mg Oral Tablet    Take 1 Tablet (25 mg total) by mouth Every 4 hours as needed for Nausea/Vomiting    QUEtiapine  (SEROQUEL ) 50 mg Oral Tablet    Take 1 Tablet (50 mg total) by mouth Every night for 30 days    rifAXIMin (XIFAXAN) 550 mg Oral Tablet    Take 1 Tablet (550 mg total) by mouth Three times a day    rOPINIRole  (REQUIP ) 1 mg Oral Tablet    Take 1 Tablet (1 mg total) by mouth Three times a day    tiZANidine  (ZANAFLEX ) 4 mg Oral Tablet    Take 1 Tablet (4 mg total) by mouth Three times a day    traMADoL  (ULTRAM ) 50 mg Oral Tablet    Take 1 Tablet (50 mg total) by mouth Every 6 hours as needed for Pain    Patient taking differently:  Take 2 Tablets (100 mg total) by mouth Every 8 hours as needed for Pain    traZODone  (DESYREL ) 100 mg Oral Tablet    Take 1 Tablet (100 mg total) by mouth Every night    verapamiL (CALAN) 40 mg Oral Tablet    Take 1 Tablet (40 mg total) by mouth Once per day as needed (HEADACHE)          [START ON 01/02/2024] cefTRIAXone (ROCEPHIN) 2 g in NS 50 mL IVPB with adaptor, 2 g, Intravenous, Q24H  doxycycline tablet, 100 mg, Oral, 2x/day  ipratropium-albuterol  0.5 mg-3 mg(2.5 mg base)/3 mL Solution for Nebulization, 3 mL, Nebulization, 4x/day  [START ON 01/02/2024] iron  sucrose (VENOFER ) 200 mg in NS 100 mL IVPB, 200 mg, Intravenous, Daily  [START ON 01/02/2024] pantoprazole  (PROTONIX ) delayed release tablet, 40 mg, Oral,  Daily        ROS:   Review of Systems   Constitutional:  Negative for chills and fever.   HENT:  Negative for ear pain and sore throat.    Eyes:  Negative for pain and visual disturbance.   Respiratory:  Positive for cough and shortness of breath.    Cardiovascular:  Negative for chest pain and palpitations.   Gastrointestinal:  Positive for abdominal distention. Negative for abdominal pain and vomiting.   Genitourinary:  Negative for dysuria and hematuria.   Musculoskeletal:  Negative for arthralgias and back pain.   Skin:  Negative for color change and rash.   Neurological:  Negative for seizures and syncope.   All other systems reviewed and are  negative.      All other systems negative unless marked.       Exam:  Vitals:    01/01/24 1700 01/01/24 1715 01/01/24 1717 01/01/24 1821   BP: (!) 147/95      Pulse: 88 100 85 85   Resp: 16 15 14     Temp:       SpO2: 100% 97% 100% 100%   Weight:       Height:       BMI:                 Physical Exam  Vitals and nursing note reviewed.   Constitutional:       General: She is not in acute distress.     Appearance: She is well-developed.   HENT:      Head: Normocephalic and atraumatic.   Eyes:      Conjunctiva/sclera: Conjunctivae normal.   Cardiovascular:      Rate and Rhythm: Normal rate and regular rhythm.      Heart sounds: No murmur heard.  Pulmonary:      Effort: Pulmonary effort is normal. No respiratory distress.      Breath sounds: Normal breath sounds.   Abdominal:      Palpations: Abdomen is soft.      Tenderness: There is no abdominal tenderness.   Musculoskeletal:         General: No swelling.      Cervical back: Neck supple.      Right lower leg: Edema present.      Left lower leg: Edema present.   Skin:     General: Skin is warm and dry.      Capillary Refill: Capillary refill takes less than 2 seconds.   Neurological:      Mental Status: She is alert.   Psychiatric:         Mood and Affect: Mood normal.             Labs:     Results for orders placed or  performed during the hospital encounter of 01/01/24 (from the past 24 hours)   URINALYSIS WITH REFLEX MICROSCOPIC AND CULTURE IF POSITIVE    Collection Time: 01/01/24  1:01 PM    Specimen: Urine, Clean Catch    Narrative    The following orders were created for panel order URINALYSIS WITH REFLEX MICROSCOPIC AND CULTURE IF POSITIVE.  Procedure                               Abnormality         Status                     ---------                               -----------         ------                     URINALYSIS, MACRO/MICRO[[id number]]      Normal              Final result                 Please view results for these tests on the individual orders.   URINALYSIS, MACRO/MICRO    Collection Time: 01/01/24  1:01 PM  Result Value Ref Range    COLOR Light Yellow Light Yellow, Yellow    APPEARANCE Clear Clear    SPECIFIC GRAVITY 1.010 1.003 - 1.035    PH 6.0 4.6 - 8.0    LEUKOCYTES Negative Negative WBCs/uL    NITRITE Negative Negative    PROTEIN Negative Negative mg/dL    GLUCOSE Negative Negative mg/dL    KETONES Negative Negative mg/dL    BILIRUBIN Negative Negative mg/dL    BLOOD Negative Negative mg/dL    UROBILINOGEN 0.2 0.2 - 1.0 mg/dL   CBC/DIFF    Collection Time: 01/01/24  1:11 PM    Narrative    The following orders were created for panel order CBC/DIFF.  Procedure                               Abnormality         Status                     ---------                               -----------         ------                     CBC WITH IPQQ[[id number]]                Abnormal            Edited Result - FINAL        Please view results for these tests on the individual orders.   COMPREHENSIVE METABOLIC PANEL, NON-FASTING    Collection Time: 01/01/24  1:11 PM   Result Value Ref Range    SODIUM 139 136 - 145 mmol/L    POTASSIUM 3.7 3.5 - 5.1 mmol/L    CHLORIDE 108 (H) 98 - 107 mmol/L    CO2 TOTAL 25 21 - 32 mmol/L    ANION GAP 6 4 - 13 mmol/L    BUN 11 7 - 18 mg/dL    CREATININE 8.87 (H) 0.55 - 1.02 mg/dL     BUN/CREA RATIO 10     ESTIMATED GFR 56 (L) >59 mL/min/1.54m^2    ALBUMIN 3.0 (L) 3.4 - 5.0 g/dL    CALCIUM 8.3 (L) 8.5 - 10.1 mg/dL    GLUCOSE 876 (H) 74 - 106 mg/dL    ALKALINE PHOSPHATASE 98 46 - 116 U/L    ALT (SGPT) 133 (H) <=78 U/L    AST (SGOT) 84 (H) 15 - 37 U/L    BILIRUBIN TOTAL 0.2 0.2 - 1.0 mg/dL    PROTEIN TOTAL 6.3 (L) 6.4 - 8.2 g/dL    ALBUMIN/GLOBULIN RATIO 0.9 0.8 - 1.4    OSMOLALITY, CALCULATED 278 270 - 290 mOsm/kg    CALCIUM, CORRECTED 9.1 mg/dL    GLOBULIN 3.3     Narrative    Estimated Glomerular Filtration Rate (eGFR) is calculated using the CKD-EPI (2021) equation, intended for patients 69 years of age and older. If gender is not documented or unknown, there will be no eGFR calculation.   RETICULOCYTE COUNT    Collection Time: 01/01/24  1:11 PM   Result Value Ref Range    RETICULOCYTE % AUTOMATED 2.17 0.51 - 2.17 %    IMMATURE RETIC FRACTION 0.45 0.26 - 0.52    RETICULOCYTES COUNT #  AUTOMATED 0.0761 0.0230 - 0.0935 10(6)/uL   FERRITIN    Collection Time: 01/01/24  1:11 PM   Result Value Ref Range    FERRITIN 215 11 - 307 ng/mL   IRON  TRANSFERRIN AND TIBC    Collection Time: 01/01/24  1:11 PM   Result Value Ref Range    TOTAL IRON  BINDING CAPACITY 333 250 - 450 ug/dL    IRON  (TRANSFERRIN) SATURATION 14 (L) 15 - 50 %    IRON  46 (L) 50 - 212 ug/dL    TRANSFERRIN 761 796 - 362 mg/dL    UIBC 712 869 - 624 ug/dL   LDH    Collection Time: 01/01/24  1:11 PM   Result Value Ref Range    LDH 252 140 - 271 U/L   CBC WITH DIFF    Collection Time: 01/01/24  1:11 PM   Result Value Ref Range    WBC 2.8 (L) 3.8 - 11.8 x10^3/uL    RBC 3.39 (L) 3.63 - 4.92 x10^6/uL    HGB 8.5 (L) 10.9 - 14.3 g/dL    HCT 72.4 (L) 68.7 - 41.9 %    MCV 81.0 75.5 - 95.3 fL    MCH 25.2 24.7 - 32.8 pg    MCHC 31.1 (L) 32.3 - 35.6 g/dL    RDW 76.4 (H) 87.6 - 17.7 %    PLATELETS 202 140 - 440 x10^3/uL    MPV 8.3 7.9 - 10.8 fL    NEUTROPHIL % 59 43 - 77 %    LYMPHOCYTE % 27 16 - 46 %    MONOCYTE % 10 4 - 11 %    EOSINOPHIL % 4 1 - 7 %     BASOPHIL % 0 0 - 1 %    NEUTROPHIL # 1.66 (L) 1.90 - 8.20 x10^3/uL    LYMPHOCYTE # 0.77 (L) 1.10 - 3.10 x10^3/uL    MONOCYTE # 0.27 0.20 - 0.90 x10^3/uL    EOSINOPHIL # 0.10 0.00 - 0.50 x10^3/uL    BASOPHIL # 0.01 0.00 - 0.10 x10^3/uL   SCAN DIFFERENTIAL    Collection Time: 01/01/24  1:11 PM   Result Value Ref Range    ANISOCYTOSIS 2+/Moderate     HYPOCHROMASIA 2+/Moderate     SCHISTOCYTES Absent     PLATELET MORPHOLOGY COMMENT Normal    COOMBS, DIRECT    Collection Time: 01/01/24  1:11 PM   Result Value Ref Range    DAT POLYSPECIFIC NEGATIVE    BLOOD GAS W/ CO-OX, LYTES, LACTATE REFLEX Venous    Collection Time: 01/01/24  1:56 PM   Result Value Ref Range    %FIO2 (VENOUS) 21.0 %    PH (VENOUS) 7.38 7.32 - 7.43    PCO2 (VENOUS) 45 41 - 51 mm/Hg    PO2 (VENOUS) 34 35 - 50 mm/Hg    BICARBONATE (VENOUS) 25.3 22.0 - 29.0 mmol/L    BASE EXCESS 1.2 0.0 - 3.0 mmol/L    HEMOGLOBIN 8.5 (L) 12.0 - 18.0 g/dL    HEMATOCRITRT 26 (L) 37 - 50 %    OXYHEMOGLOBIN 61.0 40.0 - 80.0 %    CARBOXYHEMOGLOBIN 2.2 <=3.0 %    MET-HEMOGLOBIN 1.1 <=1.5 %    O2CT 7.3 %    O2 SATURATION (VENOUS) 64.0 40.0 - 85.0 %    SODIUM 137 136 - 145 mmol/L    WHOLE BLOOD POTASSIUM 4.3 3.5 - 5.1 mmol/L    CHLORIDE 108 (H) 98 - 107 mmol/L    IONIZED CALCIUM 1.24  1.15 - 1.33 mmol/L    GLUCOSE 78 65 - 125 mg/dL    LACTATE 0.6 <=8.0 mmol/L    Narrative    VBG DRAWN BY RN VIA PORT  ROOM AIR  Manufacturer does not recommend venous sample for assessment of patient oxygenation status. A reference range for pO2, Oxyhemoglobin and Oxygen Saturation is provided but abnormal results will not flag in Epic.   RESPIRATORY CULTURE AND GRAM STAIN, AEROBIC    Collection Time: 01/01/24  6:39 PM    Specimen: Sputum; Other    Narrative    The following orders were created for panel order RESPIRATORY CULTURE AND GRAM STAIN, AEROBIC.  Procedure                               Abnormality         Status                     ---------                               -----------          ------                     SPUTUM SCREEN[[id number]]                                                                 Please view results for these tests on the individual orders.   Results for orders placed or performed during the hospital encounter of 12/31/23 (from the past 24 hours)   TROPONIN-I IN THREE HOURS    Collection Time: 12/31/23  8:40 PM   Result Value Ref Range    TROPONIN I 3 <15 ng/L    Narrative    Values received on females ranging between 12-15 ng/L MUST include the next serial troponin to review changes in the delta differences as the reference range for the Access II chemistry analyzer is lower than the established reference range.     URINALYSIS WITH REFLEX MICROSCOPIC AND CULTURE IF POSITIVE    Collection Time: 12/31/23  9:30 PM    Specimen: Urine, Clean Catch    Narrative    The following orders were created for panel order URINALYSIS WITH REFLEX MICROSCOPIC AND CULTURE IF POSITIVE.  Procedure                               Abnormality         Status                     ---------                               -----------         ------                     URINALYSIS, MACRO/MICRO[[id number]]      Normal  Final result                 Please view results for these tests on the individual orders.   URINALYSIS, MACRO/MICRO    Collection Time: 12/31/23  9:30 PM   Result Value Ref Range    COLOR Light Yellow Light Yellow, Yellow    APPEARANCE Clear Clear    SPECIFIC GRAVITY 1.010 1.003 - 1.035    PH 6.0 4.6 - 8.0    LEUKOCYTES Negative Negative WBCs/uL    NITRITE Negative Negative    PROTEIN Negative Negative mg/dL    GLUCOSE Negative Negative mg/dL    KETONES Negative Negative mg/dL    BILIRUBIN Negative Negative mg/dL    BLOOD Negative Negative mg/dL    UROBILINOGEN 0.2 0.2 - 1.0 mg/dL   BLOOD GAS W/ CO-OX, LYTES, LACTATE REFLEX Venous    Collection Time: 12/31/23  9:48 PM   Result Value Ref Range    %FIO2 (VENOUS) 21.0 %    PH (VENOUS) 7.35 7.32 - 7.43    PCO2 (VENOUS) 45 41 - 51 mm/Hg     PO2 (VENOUS) 34 35 - 50 mm/Hg    BICARBONATE (VENOUS) 23.8 22.0 - 29.0 mmol/L    BASE DEFICIT 0.8 0.0 - 3.0 mmol/L    HEMOGLOBIN 6.7 (LL) 12.0 - 18.0 g/dL    HEMATOCRITRT 20 (L) 37 - 50 %    OXYHEMOGLOBIN 58.3 40.0 - 80.0 %    CARBOXYHEMOGLOBIN 2.0 <=3.0 %    MET-HEMOGLOBIN 0.7 <=1.5 %    O2CT 5.5 %    O2 SATURATION (VENOUS) 61.8 40.0 - 85.0 %    SODIUM 137 136 - 145 mmol/L    WHOLE BLOOD POTASSIUM 4.5 3.5 - 5.1 mmol/L    CHLORIDE 108 (H) 98 - 107 mmol/L    IONIZED CALCIUM 1.23 1.15 - 1.33 mmol/L    GLUCOSE 83 65 - 125 mg/dL    LACTATE 0.7 <=8.0 mmol/L    Narrative    Lab collected.  Results given to Elesa Holts, FNP  Manufacturer does not recommend venous sample for assessment of patient oxygenation status. A reference range for pO2, Oxyhemoglobin and Oxygen Saturation is provided but abnormal results will not flag in Epic.   LACTIC ACID LEVEL W/ REFLEX FOR LEVEL >2.0    Collection Time: 12/31/23  9:49 PM   Result Value Ref Range    LACTIC ACID 0.6 0.4 - 2.0 mmol/L        Imaging Studies:    XR CHEST AP   Final Result   Bilateral atelectasis slightly increased from yesterday            Radiologist location ID: [id number]             DNR Status:  Prior    Assessment/Plan:   Active Hospital Problems    Diagnosis    Primary Problem: Acute hypoxic respiratory failure (CMS HCC)         DVT/PE Prophylaxis: HSQ  Daily Orders (From admission, onward)      None           Anticoagulants (last 24 hours)       None             # Acute Hypoxic resp failure 2/2 Pneumonia  - Ceftriaxone/doxycycline  - duonebs  - procal, strep Ag      # Severe iron  deficiency anemia  - getting venofer     # fibromyalgia  - resume home meds    #  AKI  - hold lisinopril  - repeat labs in am    # Acute on chronic CHF  - get echo  - hold lasix for tonight  - had contrast in ER, elevated creatinine    # HLD  - c/w Lipitor    # HTN  - c/w toprolXL  - hold amlodipine  and lisinopril for now      Soledad Slater, MD    This note was partially generated  using MModal Fluency Direct system, and there may be some incorrect words, spellings, and punctuation that were not noted in checking the note before saving.       [1]   Allergies  Allergen Reactions    Alcohol  Shortness of Breath     Etoh, not rubbing alcohol       Sulfa (Sulfonamides) Anaphylaxis    Ibuprofen     Influenza Virus Vaccines     Influenza Virus Vaccine, Specific Hives/ Urticaria    Penicillins Hives/ Urticaria    Pneumococcal Vaccine Hives/ Urticaria

## 2024-01-02 ENCOUNTER — Other Ambulatory Visit (INDEPENDENT_AMBULATORY_CARE_PROVIDER_SITE_OTHER): Payer: Self-pay | Admitting: NURSE PRACTITIONER

## 2024-01-02 ENCOUNTER — Ambulatory Visit (HOSPITAL_COMMUNITY): Payer: Self-pay

## 2024-01-02 ENCOUNTER — Inpatient Hospital Stay (HOSPITAL_COMMUNITY)

## 2024-01-02 DIAGNOSIS — I081 Rheumatic disorders of both mitral and tricuspid valves: Secondary | ICD-10-CM

## 2024-01-02 DIAGNOSIS — R9431 Abnormal electrocardiogram [ECG] [EKG]: Secondary | ICD-10-CM

## 2024-01-02 DIAGNOSIS — R000 Tachycardia, unspecified: Secondary | ICD-10-CM

## 2024-01-02 LAB — COMPREHENSIVE METABOLIC PANEL, NON-FASTING
ALBUMIN/GLOBULIN RATIO: 1.3 (ref 0.8–1.4)
ALBUMIN: 3.3 g/dL — ABNORMAL LOW (ref 3.5–5.7)
ALKALINE PHOSPHATASE: 73 U/L (ref 34–104)
ALT (SGPT): 79 U/L — ABNORMAL HIGH (ref 7–52)
ANION GAP: 4 mmol/L (ref 4–13)
AST (SGOT): 59 U/L — ABNORMAL HIGH (ref 13–39)
BILIRUBIN TOTAL: 0.3 mg/dL (ref 0.3–1.0)
BUN/CREA RATIO: 17 (ref 6–22)
BUN: 15 mg/dL (ref 7–25)
CALCIUM, CORRECTED: 9.2 mg/dL (ref 8.9–10.8)
CALCIUM: 8.6 mg/dL (ref 8.6–10.3)
CHLORIDE: 110 mmol/L — ABNORMAL HIGH (ref 98–107)
CO2 TOTAL: 25 mmol/L (ref 21–31)
CREATININE: 0.89 mg/dL (ref 0.60–1.30)
ESTIMATED GFR: 74 mL/min/1.73mˆ2 (ref >59)
GLOBULIN: 2.5 (ref 2.0–3.5)
GLUCOSE: 82 mg/dL (ref 74–109)
OSMOLALITY, CALCULATED: 278 mosm/kg (ref 270–290)
POTASSIUM: 4.2 mmol/L (ref 3.5–5.1)
PROTEIN TOTAL: 5.8 g/dL — ABNORMAL LOW (ref 6.4–8.9)
SODIUM: 139 mmol/L (ref 136–145)

## 2024-01-02 LAB — ECG 12 LEAD
Atrial Rate: 109 {beats}/min
Calculated P Axis: 68 degrees
Calculated R Axis: 52 degrees
Calculated T Axis: 48 degrees
PR Interval: 164 ms
QRS Duration: 74 ms
QT Interval: 316 ms
QTC Calculation: 425 ms
Ventricular rate: 109 {beats}/min

## 2024-01-02 LAB — POC BLOOD GLUCOSE (RESULTS)
GLUCOSE, POC: 77 mg/dL (ref 70–100)
GLUCOSE, POC: 92 mg/dL (ref 70–100)
GLUCOSE, POC: 241 mg/dL — ABNORMAL HIGH (ref 70–100)

## 2024-01-02 LAB — STREP PNEUMONIAE AND LEGIONELLA ANTIGEN, URINE
LEGIONELLA ANTIGEN: NEGATIVE
S.PNEUMONIAE ANTIGEN: NEGATIVE

## 2024-01-02 LAB — MAGNESIUM: MAGNESIUM: 1.7 mg/dL — ABNORMAL LOW (ref 1.9–2.7)

## 2024-01-02 LAB — PHOSPHORUS: PHOSPHORUS: 3.1 mg/dL — ABNORMAL LOW (ref 3.7–7.2)

## 2024-01-02 LAB — TRANSTHORACIC ECHOCARDIOGRAM - ADULT: EF MEASUREMENT VALUE: 56.4

## 2024-01-02 MED ORDER — MAGIC MOUTHWASH
10.0000 mL | Freq: Four times a day (QID) | ORAL | Status: DC
Start: 2024-01-02 — End: 2024-01-03
  Administered 2024-01-02 – 2024-01-03 (×4): 10 mL via ORAL
  Filled 2024-01-02: qty 180

## 2024-01-02 MED ORDER — FUROSEMIDE 20 MG TABLET
20.0000 mg | ORAL_TABLET | Freq: Every day | ORAL | Status: DC
Start: 2024-01-02 — End: 2024-01-03
  Administered 2024-01-02 – 2024-01-03 (×2): 20 mg via ORAL
  Filled 2024-01-02 (×2): qty 1

## 2024-01-02 NOTE — Care Plan (Signed)
 Patient admitted with Acute hypoxic respiratory failure. Patient resting in bed with call bell in reach. Patient up adlib to bathroom.  Patient receiving po antibiotics per orders. Labs and vitals being monitored.  Problem: Adult Inpatient Plan of Care  Goal: Patient-Specific Goal (Individualized)  Outcome: Ongoing (see interventions/notes)  Flowsheets (Taken 01/01/2024 2200)  Individualized Care Needs: monitor labs and vitals  Anxieties, Fears or Concerns: none voiced at this time

## 2024-01-02 NOTE — Care Management Notes (Signed)
 [facility]  Care Management Initial Evaluation    Patient Name: Martha White  Date of Birth: Mar 16, 1966  Sex: female  Date/Time of Admission: 01/01/2024 12:47 PM  Room/Bed: 319/B  Payor: [place] MEDICARE / Plan: [place] MEDICARE DUAL COMPLETE / Product Type: MEDICARE MC /   Primary Care Providers:  Martha Eleanor SAUNDERS, FNP, FNP (General)    Pharmacy Info:   Preferred Pharmacy       [facility] [address] - [place], [place] - [address]    [address] [place] [address]    Phone: [phone] Fax: [phone]    Hours: Not open 24 hours    [id number] [place], [facility] - [address] AT [facility] [address]    [address] [place] [address]    Phone: [phone] Fax: [phone]    Hours: Not open 24 hours    [id number] [place], [place] - [address]    [address] [place] [address]    Phone: [phone] Fax: [phone]    Hours: Not open 24 hours    [facility] - [place], [place] - [address]    [address] [place] [address]    Phone: [phone] Fax: [phone]    Hours: Not open 24 hours          Emergency Contact Info:   Extended Emergency Contact Information  Primary Emergency Contact: Martha White  Address: [address]           [place], [place] [address] [place]  Mobile Phone: [phone]  Relation: Husband  Preferred language: English  Interpreter needed? No  Secondary Emergency Contact: Martha White  Mobile Phone: [phone]  Relation: Son  Preferred language: English  Interpreter needed? No    History:   Martha White is a 58 y.o., female, admitted 01/01/24    Height/Weight: 151.1 cm (4' 11.5) / 76.1 kg (167 lb 11.2 oz)     LOS: 1 day   Admitting Diagnosis: Acute hypoxic respiratory failure (CMS HCC) [J96.01]    Assessment:    01/02/24 1458   Assessment Details   Assessment Type Admission   Date of Care Management Update 01/02/24   Readmission   Is this a  readmission? Yes   Insurance Information/Type   Insurance type Medicare   Employment/Financial   Patient has Prescription Coverage?  Yes        Name of Insurance Coverage for Medications [facility] Medicare/[place] Medicaid   Financial/Environmental Concerns none   Living Environment   Lives With spouse   Living Arrangements house   Able to Return to Prior Arrangements yes   Living Arrangement Comments Pt lives with her husband in a two story house.   Home Safety   Home Assessment: No Problems Identified   Home Accessibility no concerns;stairs within home;stairs to enter home   Custody and Legal Status   Do you have a court appointed guardian/conservator? No   Are you an emancipated minor? No   Custody Issues? No   Paternity Affidavit Requested? No   Care Management Plan   Discharge Planning Status initial meeting   Projected Discharge Date 01/04/24   Discharge plan discussed with: Patient   CM will evaluate for rehabilitation potential no   Discharge Needs Assessment   Equipment Currently Used at Home none   Equipment Needed After Discharge none   Discharge Facility/Level of Care Needs Home (Patient/Family Member/other)(code  1)   Transportation Available car;family or friend will provide   Referral Information   Admission Type inpatient   Address Verified verified-no changes   Arrived From home or self-care   ADVANCE DIRECTIVES   Does the Patient have an Advance Directive? No, Information Offered and Refused           Discharge Plan:  Home (Patient/Family Member/other) (code 1)    Initial CM assessment completed.  Pt admitted with acute hypoxic respiratory failure.  Pt states she was independent prior to admission. She lives with husband in a two story home.  She denies HH and DME.  She states discharge goals are to return home .  No discharge needs identified at this time.     The patient will continue to be evaluated for developing discharge needs.     Case Manager: Martha Molt, RN  Phone: [phone]

## 2024-01-02 NOTE — Progress Notes (Addendum)
 [facility]  Admission H&P      Date of Service:  01/02/2024  Wilson Prime y.o. female  Date of Admission:  01/01/2024  Date of Birth:  [birth year]      Chief Complaint:  abd pain    HPI: Martha White is a 58 y.o., Black or [place] American female with past medical history of Chronic Obstructive Pulmonary Disease, asthma not on any home oxygen, diabetes, fibromyalgia, gastroparesis, hypertension, severe iron -deficiency anemia who came to [facility] ER due to abdominal pain.  Also she was found to be hypoxic, CT chest showed findings concerning for pneumonia.  Patient was placed on 2 L nasal cannula and transferred to [facility] for further management.    Patient seen and examined at bedside, reports low-grade fevers, cough.  Leg pains likely in the setting of fibromyalgia.    Of note patient was admitted to [facility] last month was found to be anemic and had severe iron -deficiency anemia.  EGD was done which showed superficial ulcers.  Colonoscopy could not be done due to poor prep and she was planned to do colonoscopy outpatient.    History:    Past Medical:    Past Medical History:   Diagnosis Date    Arthropathy     Asthma     Carpal tunnel syndrome     Carpal tunnel syndrome     Congestive heart failure     COPD (chronic obstructive pulmonary disease)     Depression     Diabetes mellitus, type 2     Fibromyalgia     Gastroparesis     Headache     HTN (hypertension)     Lumbar herniated disc     Neuropathy (CMS HCC)     Restless legs syndrome (RLS)     Thyroid  disease     TIA (transient ischemic attack)     TIA (transient ischemic attack)      Past Surgical:    Past Surgical History:   Procedure Laterality Date    HX CHOLECYSTECTOMY      HX HERNIA REPAIR      HX HERNIA REPAIR      HX HYSTERECTOMY      HX ROTATOR CUFF REPAIR      SHOULDER OPEN ROTATOR CUFF REPAIR Right     STOMACH SURGERY      X4     Family:    Family Medical History:       Problem Relation (Age of Onset)     Alzheimer's/Dementia Maternal Grandmother    Heart Attack Maternal Grandfather    Hypertension (High Blood Pressure) Mother, Maternal Grandmother, Maternal Grandfather    Pancreatic Cancer Maternal Grandmother          Social:   reports that she has never smoked. She has never used smokeless tobacco. She reports that she does not drink alcohol  and does not use drugs.    Allergies[1]  Medications Prior to Admission       Prescriptions    albuterol  sulfate (PROVENTIL  OR VENTOLIN  OR PROAIR ) 90 mcg/actuation Inhalation oral inhaler    Take 1-2 Puffs by inhalation Every 6 hours as needed    amLODIPine  (NORVASC ) 10 mg Oral Tablet    Take 1 Tablet (10 mg total) by mouth Once a day    aspirin  (ECOTRIN) 81 mg Oral Tablet, Delayed Release (E.C.)    Take 1 Tablet (81 mg total) by mouth Once a day    atorvastatin  calcium (ATORVASTATIN  ORAL)  Take 80 mg by mouth Once a day    bethanechol chloride (URECHOLINE) 5 mg Oral Tablet    bethanechol chloride 5 mg tablet   TAKE ONE TABLET BY MOUTH ONCE EVERY DAY    chlorzoxazone  (PARAFON  FORTE) 500 mg Oral Tablet    Take 1 Tablet (500 mg total) by mouth Every 6 hours as needed for Muscle spasms    clopidogreL (PLAVIX) 75 mg Oral Tablet    Take 1 Tablet (75 mg total) by mouth Once a day    cyanocobalamin (VITAMIN B12) 1,000 mcg/mL Injection Solution    Inject 1 mL (1,000 mcg total) under the skin Every 14 days    cyclobenzaprine  (FLEXERIL ) 10 mg Oral Tablet    Take 0.5 Tablets (5 mg total) by mouth Three times a day    cyclobenzaprine  (FLEXERIL ) 5 mg Oral Tablet    cyclobenzaprine  5 mg tablet   Take 1 tablet twice a day by oral route for 30 days.    divalproex (DEPAKOTE ER) 250 mg Oral Tablet Sustained Release 24 hr    Take 1 Tablet (250 mg total) by mouth    docusate sodium (COLACE) 100 mg Oral Capsule    100 mg by oral route.    doxycycline hyclate (VIBRAMYCIN) 100 mg Oral Capsule    Take 1 Capsule (100 mg total) by mouth Twice daily for 10 days    dulaglutide (TRULICITY) 0.75 mg/0.5  mL Subcutaneous Pen Injector    Inject 0.5 mL (0.75 mg total) under the skin Every 7 days    DULoxetine  (CYMBALTA  DR) 30 mg Oral Capsule, Delayed Release(E.C.)    Take 1 Capsule (30 mg total) by mouth Twice daily    ergocalciferol , vitamin D2, (DRISDOL) 1,250 mcg (50,000 unit) Oral Capsule    Take 1 Capsule (50,000 Units total) by mouth Every 7 days    famotidine  (PEPCID ) 40 mg Oral Tablet    Take 1 Tablet (40 mg total) by mouth Twice daily for 30 days    furosemide (LASIX) 20 mg Oral Tablet    Take 1 Tablet (20 mg total) by mouth Once per day as needed    gabapentin (NEURONTIN) 400 mg Oral Capsule    Take 1 Capsule (400 mg total) by mouth Three times a day    hydrOXYzine pamoate (VISTARIL) 25 mg Oral Capsule    Take 1 Capsule (25 mg total) by mouth Every night    insulin  detemir U-100 (LEVEMIR FLEXPEN) 100 unit/mL (3 mL) Subcutaneous Insulin  Pen    Inject 60 Units under the skin Once per day as needed (SLIDING SCALE)    insulin  lispro (HUMALOG  KWIKPEN INSULIN  SUBQ)    Inject under the skin 7-20 units tid before meals    ipratropium/albuterol  sulfate (COMBIVENT INHL)    Take 1 Puff by inhalation Four times a day as needed    lidocaine -prilocaine (EMLA) 2.5-2.5 % Cream    Apply topically to Port-A-Cath site 30 minutes prior to excessive port.    lisinopriL (PRINIVIL) 20 mg Oral Tablet    Take 1 Tablet (20 mg total) by mouth Once a day    loratadine (CLARITIN) 10 mg Oral Tablet    Take 1 Tablet (10 mg total) by mouth Once per day as needed    magnesium  chloride (SLOW-MAG) 64 mg Oral Tablet, Delayed Release (E.C.)    Take 1 Tablet (64 mg total) by mouth Daily Slow mag 71.5 mg-119 mg days 2 tablets daily    MetFORMIN (GLUCOPHAGE) 1,000 mg Oral Tablet  Take 1 Tablet (1,000 mg total) by mouth Every morning with breakfast    metoclopramide  (REGLAN ) 100 mcg/mL Oral Solution    Take 5 mL (500 mcg total) by mouth Four times a day - before meals and bedtime    metoprolol  succinate (TOPROL -XL) 50 mg Oral Tablet Sustained  Release 24 hr    Take 1 Tablet (50 mg total) by mouth Once a day    omeprazole (PRILOSEC) 40 mg Oral Capsule, Delayed Release(E.C.)    Take 1 Capsule (40 mg total) by mouth Once a day    ondansetron  (ZOFRAN  ODT) 8 mg Oral Tablet, Rapid Dissolve    Take 1 Tablet (8 mg total) by mouth Every 8 hours as needed for Nausea/Vomiting    pregabalin  (LYRICA ) 50 mg Oral Capsule    Take 1 Capsule (50 mg total) by mouth Every night    prochlorperazine  (COMPAZINE ) 10 mg Oral Tablet    Take 1 Tablet (10 mg total) by mouth Four times a day as needed for Nausea/Vomiting For use when she can take oral.    promethazine  (PHENERGAN ) 25 mg Oral Tablet    Take 1 Tablet (25 mg total) by mouth Every 4 hours as needed for Nausea/Vomiting    QUEtiapine  (SEROQUEL ) 50 mg Oral Tablet    Take 1 Tablet (50 mg total) by mouth Every night for 30 days    rifAXIMin (XIFAXAN) 550 mg Oral Tablet    Take 1 Tablet (550 mg total) by mouth Three times a day    rOPINIRole  (REQUIP ) 1 mg Oral Tablet    Take 1 Tablet (1 mg total) by mouth Three times a day    tiZANidine  (ZANAFLEX ) 4 mg Oral Tablet    Take 1 Tablet (4 mg total) by mouth Three times a day    traMADoL  (ULTRAM ) 50 mg Oral Tablet    Take 1 Tablet (50 mg total) by mouth Every 6 hours as needed for Pain    Patient taking differently:  Take 2 Tablets (100 mg total) by mouth Every 8 hours as needed for Pain    traZODone  (DESYREL ) 100 mg Oral Tablet    Take 1 Tablet (100 mg total) by mouth Every night    verapamiL (CALAN) 40 mg Oral Tablet    Take 1 Tablet (40 mg total) by mouth Once per day as needed (HEADACHE)          aspirin  chewable tablet 81 mg, 81 mg, Oral, Daily  atorvastatin  (LIPITOR) tablet, 80 mg, Oral, Daily  cefTRIAXone (ROCEPHIN) 2 g in NS 50 mL IVPB with adaptor, 2 g, Intravenous, Q24H  cholecalciferol  (VITAMIN D3) 1000 unit (25 mcg) tablet, 1,000 Units, Oral, Daily  clopidogrel (PLAVIX) 75 mg tablet, 75 mg, Oral, Daily  Correction/SSIP insulin  lispro 100 units/mL injection, 3-14 Units,  Subcutaneous, 4x/day AC  dextrose  (GLUTOSE) 40% oral gel, 15 g, Oral, Q15 Min PRN  dextrose  50% (0.5 g/mL) injection - syringe, 12.5 g, Intravenous, Q15 Min PRN  divalproex ER (DEPAKOTE ER) 24 hr extended release tablet, 250 mg, Oral, Daily  doxycycline tablet, 100 mg, Oral, 2x/day  DULoxetine  (CYMBALTA ) delayed release capsule, 30 mg, Oral, 2x/day  glucagon  injection 1 mg, 1 mg, IntraMUSCULAR, Once PRN  heparin  5,000 unit/mL injection, 5,000 Units, Subcutaneous, Q8HRS  ipratropium-albuterol  0.5 mg-3 mg(2.5 mg base)/3 mL Solution for Nebulization, 3 mL, Nebulization, 4x/day  iron  sucrose (VENOFER ) 200 mg in NS 100 mL IVPB, 200 mg, Intravenous, Daily  Magic Mouthwash, 10 mL, Swish & Swallow, 4x/day  metoprolol  succinate (TOPROL -XL)  24 hr extended release tablet, 50 mg, Oral, Daily  oxyCODONE -acetaminophen  (PERCOCET) 5-325mg  per tablet, 1 Tablet, Oral, Q6H PRN  pantoprazole  (PROTONIX ) delayed release tablet, 40 mg, Oral, Daily  pantoprazole  (PROTONIX ) delayed release tablet, 40 mg, Oral, Daily  pregabalin  (LYRICA ) capsule, 50 mg, Oral, NIGHTLY  prochlorperazine  (COMPAZINE ) 5 mg/mL injection, 10 mg, Intravenous, Q6H PRN  QUEtiapine  (SEROquel ) tablet, 50 mg, Oral, NIGHTLY  rOPINIRole  (REQUIP ) tablet, 1 mg, Oral, 3x/day  traZODone  (DESYREL ) tablet, 100 mg, Oral, NIGHTLY        ROS:   Review of Systems   Constitutional:  Negative for chills and fever.   HENT:  Negative for ear pain and sore throat.    Eyes:  Negative for pain and visual disturbance.   Respiratory:  Positive for cough and shortness of breath.    Cardiovascular:  Negative for chest pain and palpitations.   Gastrointestinal:  Positive for abdominal distention. Negative for abdominal pain and vomiting.   Genitourinary:  Negative for dysuria and hematuria.   Musculoskeletal:  Negative for arthralgias and back pain.   Skin:  Negative for color change and rash.   Neurological:  Negative for seizures and syncope.   All other systems reviewed and are  negative.      All other systems negative unless marked.       Exam:  Vitals:    01/02/24 0809 01/02/24 0842 01/02/24 1037 01/02/24 1206   BP: 115/65 121/80  133/88   Pulse: 75 83 69 80   Resp: 18   18   Temp: 36.4 C (97.5 F)   36.3 C (97.3 F)   SpO2: 98%  99% 98%   Weight:       Height:       BMI:                 Physical Exam  Vitals and nursing note reviewed.   Constitutional:       General: She is not in acute distress.     Appearance: She is well-developed.   HENT:      Head: Normocephalic and atraumatic.   Eyes:      Conjunctiva/sclera: Conjunctivae normal.   Cardiovascular:      Rate and Rhythm: Normal rate and regular rhythm.      Heart sounds: No murmur heard.  Pulmonary:      Effort: Pulmonary effort is normal. No respiratory distress.      Breath sounds: Normal breath sounds.   Abdominal:      Palpations: Abdomen is soft.      Tenderness: There is no abdominal tenderness.   Musculoskeletal:         General: No swelling.      Cervical back: Neck supple.      Right lower leg: Edema present.      Left lower leg: Edema present.   Skin:     General: Skin is warm and dry.      Capillary Refill: Capillary refill takes less than 2 seconds.   Neurological:      Mental Status: She is alert.   Psychiatric:         Mood and Affect: Mood normal.             Labs:     Results for orders placed or performed during the hospital encounter of 01/01/24 (from the past 24 hours)   SPUTUM SCREEN    Collection Time: 01/01/24  2:31 PM    Specimen: Sputum   Result Value  Ref Range    SPUTUM SCREEN       Reject for Culture, >10 squamous epithelial cells per low power microscopic field (10x objective lens)   CBC/DIFF *Canceled*    Collection Time: 01/01/24  6:36 PM    Narrative    The following orders were created for panel order CBC/DIFF.  Procedure                               Abnormality         Status                     ---------                               -----------         ------                       Please view  results for these tests on the individual orders.   RESPIRATORY CULTURE AND GRAM STAIN, AEROBIC    Collection Time: 01/01/24  6:39 PM    Specimen: Sputum; Other    Narrative    The following orders were created for panel order RESPIRATORY CULTURE AND GRAM STAIN, AEROBIC.  Procedure                               Abnormality         Status                     ---------                               -----------         ------                     SPUTUM SCREEN[[id number]]                                                                 Please view results for these tests on the individual orders.   POC BLOOD GLUCOSE (RESULTS)    Collection Time: 01/01/24  8:46 PM   Result Value Ref Range    GLUCOSE, POC 131 (H) 70 - 100 mg/dl   COMPREHENSIVE METABOLIC PANEL, NON-FASTING    Collection Time: 01/02/24  4:15 AM   Result Value Ref Range    SODIUM 139 136 - 145 mmol/L    POTASSIUM 4.2 3.5 - 5.1 mmol/L    CHLORIDE 110 (H) 98 - 107 mmol/L    CO2 TOTAL 25 21 - 31 mmol/L    ANION GAP 4 4 - 13 mmol/L    BUN 15 7 - 25 mg/dL    CREATININE 9.10 9.39 - 1.30 mg/dL    BUN/CREA RATIO 17 6 - 22    ESTIMATED GFR 74 >59 mL/min/1.44m^2    ALBUMIN 3.3 (L) 3.5 - 5.7 g/dL    CALCIUM 8.6 8.6 - 89.6 mg/dL    GLUCOSE 82 74 - 890 mg/dL  ALKALINE PHOSPHATASE 73 34 - 104 U/L    ALT (SGPT) 79 (H) 7 - 52 U/L    AST (SGOT) 59 (H) 13 - 39 U/L    BILIRUBIN TOTAL 0.3 0.3 - 1.0 mg/dL    PROTEIN TOTAL 5.8 (L) 6.4 - 8.9 g/dL    ALBUMIN/GLOBULIN RATIO 1.3 0.8 - 1.4    OSMOLALITY, CALCULATED 278 270 - 290 mOsm/kg    CALCIUM, CORRECTED 9.2 8.9 - 10.8 mg/dL    GLOBULIN 2.5 2.0 - 3.5    Narrative    Estimated Glomerular Filtration Rate (eGFR) is calculated using the CKD-EPI (2021) equation, intended for patients 23 years of age and older. If gender is not documented or unknown, there will be no eGFR calculation.     PHOSPHORUS    Collection Time: 01/02/24  4:15 AM   Result Value Ref Range    PHOSPHORUS 3.1 (L) 3.7 - 7.2 mg/dL   MAGNESIUM     Collection Time:  01/02/24  4:15 AM   Result Value Ref Range    MAGNESIUM  1.7 (L) 1.9 - 2.7 mg/dL   CBC/DIFF    Collection Time: 01/02/24  4:20 AM    Narrative    The following orders were created for panel order CBC/DIFF.  Procedure                               Abnormality         Status                     ---------                               -----------         ------                     CBC WITH IPQQ[[id number]]                Abnormal            Final result                 Please view results for these tests on the individual orders.   CBC WITH DIFF    Collection Time: 01/02/24  4:20 AM   Result Value Ref Range    WBC 2.6 (L) 3.8 - 11.8 x10^3/uL    RBC 3.47 (L) 3.63 - 4.92 x10^6/uL    HGB 8.7 (L) 10.9 - 14.3 g/dL    HCT 73.0 (L) 68.7 - 41.9 %    MCV 77.5 75.5 - 95.3 fL    MCH 25.1 24.7 - 32.8 pg    MCHC 32.4 32.3 - 35.6 g/dL    RDW 76.6 (H) 87.6 - 17.7 %    PLATELETS 184 140 - 440 x10^3/uL    MPV 7.4 (L) 7.9 - 10.8 fL   SCAN DIFFERENTIAL    Collection Time: 01/02/24  4:20 AM   Result Value Ref Range    HYPOCHROMASIA 2+/Moderate     SCHISTOCYTES Absent     OVALOCYTE (ELLIPTOCYTE) Moderate/5-20%     PLATELET MORPHOLOGY COMMENT Normal    POC BLOOD GLUCOSE (RESULTS)    Collection Time: 01/02/24  7:33 AM   Result Value Ref Range    GLUCOSE, POC 77 70 - 100 mg/dl   POC BLOOD GLUCOSE (RESULTS)    Collection Time:  01/02/24 11:09 AM   Result Value Ref Range    GLUCOSE, POC 92 70 - 100 mg/dl        Imaging Studies:    XR CHEST AP   Final Result   Bilateral atelectasis slightly increased from yesterday            Radiologist location ID: [id number]             DNR Status:  Prior    Assessment/Plan:   Active Hospital Problems    Diagnosis    Primary Problem: Acute hypoxic respiratory failure (CMS HCC)         DVT/PE Prophylaxis: HSQ  Daily Orders (From admission, onward)      None           Anticoagulants (last 24 hours)       Date/Time Action Medication Dose    01/02/24 1316 Given    heparin  5,000 unit/mL injection 5,000 Units     01/02/24 0420 Given    heparin  5,000 unit/mL injection 5,000 Units    01/01/24 2148 Given    heparin  5,000 unit/mL injection 5,000 Units             # Acute Hypoxic resp failure 2/2 Pneumonia  - Ceftriaxone/doxycycline  - duonebs  - procal, strep Ag      # Severe iron  deficiency anemia  - getting venofer     # fibromyalgia  - resume home meds    # AKI  - hold lisinopril  - repeat labs in am    # Acute on chronic diastolic CHF  - get echo  - lasix    # HLD  - c/w Lipitor    # HTN  - c/w toprolXL  - hold amlodipine  and lisinopril for now      Soledad Slater, MD    This note was partially generated using MModal Fluency Direct system, and there may be some incorrect words, spellings, and punctuation that were not noted in checking the note before saving.         [1]   Allergies  Allergen Reactions    Alcohol  Shortness of Breath     Etoh, not rubbing alcohol       Sulfa (Sulfonamides) Anaphylaxis    Ibuprofen     Influenza Virus Vaccines     Influenza Virus Vaccine, Specific Hives/ Urticaria    Penicillins Hives/ Urticaria    Pneumococcal Vaccine Hives/ Urticaria

## 2024-01-02 NOTE — Care Plan (Addendum)
 Medical Nutrition Therapy Assessment    Reason for assessment: weight loss    SUBJECTIVE : Patient reports chronic fair appetite.  She states that she eats better some days than others.  She reports having about 10 esophageal dilations.  Due to this, she has to be careful when swallowing.  She reports burning sensation in her mouth for at least a week.  This includes when drinking water .  She's not noticed mouth sores.  She limits some foods due to GI intolerance.  She reports UBW of 170 lbs.  She reports not tolerating nutrition supplements.    OBJECTIVE:   PMH includes:  CHF, COPD, DM, depression, fibromyalgia, gastroparesis, neuropathy, TIA  Current Diet Order/Nutrition Support:  DIET DIABETIC Calorie amount: CC 1800; Do you want to initiate MNT Protocol? Yes; Additional modifications/limitations: 2 G SODIUM  GUEST TRAY    Height Used for Calculations: 151.1 cm (4' 11.49)  Weight Used For Calculations: 76.1 kg (167 lb 12.3 oz)  BMI (kg/m2): 33.4  Ideal Body Weight (IBW) (kg): 44.69  % Ideal Body Weight: 170.28    Estimated Needs:  Energy Calorie Requirements: 1364 kcal/day (30 kcal/kg IBW)  Protein Requirements (gms/day): 55 gm/day (1.2 gm/kg IBW)    Comments: Admission problems include acute respiratory failure, pneumonia, severe iron  deficiency anemia, AKI, acute on chronic CHF.  Meds also include Protonix .    Plan/Interventions :   Continue with diabetic, low sodium diet.  Discussed mouth soreness with nurse and charge RN.  Noted that Magic Mouthwash was then added by physician.  Monitor diet tolerance and intake.  Discussed the importance of adequate intake.  Encouraged small, easy to digest meals 2' gastroparesis and fair appetite.    Nutrition Diagnosis: Inadequate energy intake related to fair appetite, recent sore mouth as evidenced by Unintentional weight loss , Decreased oral intake     Eleanor Mood, RD

## 2024-01-03 DIAGNOSIS — J4489 Other specified chronic obstructive pulmonary disease: Secondary | ICD-10-CM

## 2024-01-03 DIAGNOSIS — J9601 Acute respiratory failure with hypoxia: Secondary | ICD-10-CM

## 2024-01-03 DIAGNOSIS — Z880 Allergy status to penicillin: Secondary | ICD-10-CM

## 2024-01-03 DIAGNOSIS — Z882 Allergy status to sulfonamides status: Secondary | ICD-10-CM

## 2024-01-03 DIAGNOSIS — Z886 Allergy status to analgesic agent status: Secondary | ICD-10-CM

## 2024-01-03 DIAGNOSIS — D509 Iron deficiency anemia, unspecified: Secondary | ICD-10-CM

## 2024-01-03 DIAGNOSIS — Z887 Allergy status to serum and vaccine status: Secondary | ICD-10-CM

## 2024-01-03 LAB — COMPREHENSIVE METABOLIC PANEL, NON-FASTING
ALBUMIN/GLOBULIN RATIO: 1.2 (ref 0.8–1.4)
ALBUMIN: 3.3 g/dL — ABNORMAL LOW (ref 3.5–5.7)
ALKALINE PHOSPHATASE: 78 U/L (ref 34–104)
ALT (SGPT): 67 U/L — ABNORMAL HIGH (ref 7–52)
ANION GAP: 5 mmol/L (ref 4–13)
AST (SGOT): 43 U/L — ABNORMAL HIGH (ref 13–39)
BILIRUBIN TOTAL: 0.3 mg/dL (ref 0.3–1.0)
BUN/CREA RATIO: 14 (ref 6–22)
BUN: 13 mg/dL (ref 7–25)
CALCIUM, CORRECTED: 9.4 mg/dL (ref 8.9–10.8)
CALCIUM: 8.8 mg/dL (ref 8.6–10.3)
CHLORIDE: 107 mmol/L (ref 98–107)
CO2 TOTAL: 27 mmol/L (ref 21–31)
CREATININE: 0.91 mg/dL (ref 0.60–1.30)
ESTIMATED GFR: 72 mL/min/1.73mˆ2 (ref >59)
GLOBULIN: 2.8 (ref 2.0–3.5)
GLUCOSE: 89 mg/dL (ref 74–109)
OSMOLALITY, CALCULATED: 277 mosm/kg (ref 270–290)
POTASSIUM: 4.1 mmol/L (ref 3.5–5.1)
PROTEIN TOTAL: 6.1 g/dL — ABNORMAL LOW (ref 6.4–8.9)
SODIUM: 139 mmol/L (ref 136–145)

## 2024-01-03 LAB — CBC WITH DIFF
BASOPHIL #: 0.1 x10ˆ3/uL (ref 0.00–0.10)
BASOPHIL #: 0 x10ˆ3/uL (ref 0.00–0.10)
BASOPHIL %: 3 % — ABNORMAL HIGH (ref 0–1)
BASOPHIL %: 2 % — ABNORMAL HIGH (ref 0–1)
EOSINOPHIL #: 0.1 x10ˆ3/uL (ref 0.00–0.50)
EOSINOPHIL #: 0.1 x10ˆ3/uL (ref 0.00–0.50)
EOSINOPHIL %: 6 % (ref 1–7)
EOSINOPHIL %: 4 % (ref 1–7)
HCT: 26.9 % — ABNORMAL LOW (ref 31.2–41.9)
HCT: 27.1 % — ABNORMAL LOW (ref 31.2–41.9)
HGB: 8.7 g/dL — ABNORMAL LOW (ref 10.9–14.3)
HGB: 9.1 g/dL — ABNORMAL LOW (ref 10.9–14.3)
LYMPHOCYTE #: 0.9 x10ˆ3/uL — ABNORMAL LOW (ref 1.10–3.10)
LYMPHOCYTE #: 0.8 x10ˆ3/uL — ABNORMAL LOW (ref 1.10–3.10)
LYMPHOCYTE %: 35 % (ref 16–46)
LYMPHOCYTE %: 27 % (ref 16–46)
MCH: 25.1 pg (ref 24.7–32.8)
MCH: 26 pg (ref 24.7–32.8)
MCHC: 32.4 g/dL (ref 32.3–35.6)
MCHC: 33.7 g/dL (ref 32.3–35.6)
MCV: 77.5 fL (ref 75.5–95.3)
MCV: 77 fL (ref 75.5–95.3)
MONOCYTE #: 0.3 x10ˆ3/uL (ref 0.20–0.90)
MONOCYTE #: 0.4 x10ˆ3/uL (ref 0.20–0.90)
MONOCYTE %: 12 % — ABNORMAL HIGH (ref 4–11)
MONOCYTE %: 13 % — ABNORMAL HIGH (ref 4–11)
MPV: 7.4 fL — ABNORMAL LOW (ref 7.9–10.8)
MPV: 7.4 fL — ABNORMAL LOW (ref 7.9–10.8)
NEUTROPHIL #: 1.2 x10ˆ3/uL — ABNORMAL LOW (ref 1.90–8.20)
NEUTROPHIL #: 1.7 x10ˆ3/uL — ABNORMAL LOW (ref 1.90–8.20)
NEUTROPHIL %: 45 % (ref 43–77)
NEUTROPHIL %: 55 % (ref 43–77)
PLATELETS: 184 x10ˆ3/uL (ref 140–440)
PLATELETS: 192 x10ˆ3/uL (ref 140–440)
RBC: 3.47 x10ˆ6/uL — ABNORMAL LOW (ref 3.63–4.92)
RBC: 3.52 x10ˆ6/uL — ABNORMAL LOW (ref 3.63–4.92)
RDW: 23.3 % — ABNORMAL HIGH (ref 12.3–17.7)
RDW: 23.1 % — ABNORMAL HIGH (ref 12.3–17.7)
WBC: 2.6 x10ˆ3/uL — ABNORMAL LOW (ref 3.8–11.8)
WBC: 3 x10ˆ3/uL — ABNORMAL LOW (ref 3.8–11.8)

## 2024-01-03 LAB — SCAN DIFFERENTIAL
PLATELET MORPHOLOGY COMMENT: NORMAL
PLATELET MORPHOLOGY COMMENT: NORMAL
RBC MORPHOLOGY COMMENT: NORMAL
SCHISTOCYTES: ABSENT
SCHISTOCYTES: ABSENT

## 2024-01-03 LAB — MAGNESIUM: MAGNESIUM: 1.7 mg/dL — ABNORMAL LOW (ref 1.9–2.7)

## 2024-01-03 LAB — POC BLOOD GLUCOSE (RESULTS)
GLUCOSE, POC: 94 mg/dL (ref 70–100)
GLUCOSE, POC: 111 mg/dL — ABNORMAL HIGH (ref 70–100)

## 2024-01-03 LAB — PHOSPHORUS: PHOSPHORUS: 2.9 mg/dL — ABNORMAL LOW (ref 3.7–7.2)

## 2024-01-03 LAB — PROCALCITONIN: PROCALCITONIN: 0.05 ng/mL (ref <0.50)

## 2024-01-03 MED ORDER — DOXYCYCLINE HYCLATE 100 MG TABLET
100.0000 mg | ORAL_TABLET | Freq: Two times a day (BID) | ORAL | 0 refills | Status: DC
Start: 2024-01-03 — End: 2024-01-30

## 2024-01-03 MED ORDER — GABAPENTIN 400 MG CAPSULE: 400.0000 mg | ORAL_CAPSULE | Freq: Two times a day (BID) | ORAL | Status: DC

## 2024-01-03 MED ORDER — MAGNESIUM SULFATE 1 GRAM/100 ML IN DEXTROSE 5 % INTRAVENOUS PIGGYBACK
1.0000 g | INJECTION | INTRAVENOUS | Status: AC
Start: 2024-01-03 — End: 2024-01-03
  Administered 2024-01-03 (×2): 1 g via INTRAVENOUS
  Administered 2024-01-03 (×2): 0 g via INTRAVENOUS
  Filled 2024-01-03 (×2): qty 100

## 2024-01-03 MED ORDER — OXYCODONE 5 MG TABLET
2.5000 mg | ORAL_TABLET | Freq: Two times a day (BID) | ORAL | 0 refills | Status: DC | PRN
Start: 2024-01-03 — End: 2024-01-30

## 2024-01-03 MED ORDER — NALOXONE 4 MG/ACTUATION NASAL SPRAY
1.0000 | NASAL | 0 refills | Status: DC | PRN
Start: 2024-01-03 — End: 2024-01-30

## 2024-01-03 NOTE — Care Plan (Signed)
 Problem: Wound  Goal: Optimal Coping  Outcome: Ongoing (see interventions/notes)  Goal: Optimal Functional Ability  Outcome: Ongoing (see interventions/notes)  Goal: Absence of Infection Signs and Symptoms  Outcome: Ongoing (see interventions/notes)  Goal: Improved Oral Intake  Outcome: Ongoing (see interventions/notes)  Goal: Optimal Pain Control and Function  Outcome: Ongoing (see interventions/notes)  Goal: Skin Health and Integrity  Outcome: Ongoing (see interventions/notes)  Goal: Optimal Wound Healing  Outcome: Ongoing (see interventions/notes)     Problem: Adult Inpatient Plan of Care  Goal: Plan of Care Review  Outcome: Ongoing (see interventions/notes)  Goal: Patient-Specific Goal (Individualized)  Outcome: Ongoing (see interventions/notes)  Goal: Absence of Hospital-Acquired Illness or Injury  Outcome: Ongoing (see interventions/notes)  Intervention: Identify and Manage Fall Risk  Recent Flowsheet Documentation  Taken 01/02/2024 2000 by Lupita ORN, GN  Safety Promotion/Fall Prevention:   safety round/check completed   nonskid shoes/slippers when out of bed  Goal: Optimal Comfort and Wellbeing  Outcome: Ongoing (see interventions/notes)  Goal: Rounds/Family Conference  Outcome: Ongoing (see interventions/notes)     Problem: Health Knowledge, Opportunity to Enhance (Adult,Obstetrics,Pediatric)  Goal: Knowledgeable about Health Subject/Topic  Description: Patient will demonstrate the desired outcomes by discharge/transition of care.  Outcome: Ongoing (see interventions/notes)     Problem: Depression  Goal: Decreased Depression Symptoms  Outcome: Ongoing (see interventions/notes)     Problem: Gas Exchange Impaired  Goal: Optimal Gas Exchange  Outcome: Ongoing (see interventions/notes)

## 2024-01-03 NOTE — Discharge Summary (Signed)
 [facility]  DISCHARGE SUMMARY    PATIENT NAME:  Martha White, Martha White  MRN:  [id number]  DOB:  1966/03/18    ENCOUNTER DATE:  01/01/2024  INPATIENT ADMISSION DATE: 01/01/2024  DISCHARGE DATE:  01/03/2024    ATTENDING PHYSICIAN: Twanna Labrum, MD  SERVICE: PRN HOSPITALIST 4  PRIMARY CARE PHYSICIAN: Eleanor JONELLE Lee, FNP       No lay caregiver identified.    PRIMARY DISCHARGE DIAGNOSIS: Acute hypoxic respiratory failure (CMS [facility])  Active Hospital Problems    Diagnosis Date Noted    Principal Problem: Acute hypoxic respiratory failure (CMS [facility]) [J96.01] 01/01/2024      Resolved Hospital Problems   No resolved problems to display.     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        Details   doxycycline hyclate 100 mg Tablet  Replaces: doxycycline hyclate 100 mg Capsule   100 mg, Oral, 2 TIMES DAILY  Qty: 10 Tablet  Refills: 0     naloxone 4 mg/actuation Spray, Non-Aerosol  Commonly known as: NARCAN   1 Spray, INTRANASAL, EVERY 2 MIN PRN, Call 911 if given.  Qty: 2 Each  Refills: 0     oxyCODONE  5 mg Tablet  Commonly known as: ROXICODONE    2.5 mg, Oral, EVERY 12 HOURS PRN  Qty: 10 Tablet  Refills: 0            CONTINUE these medications which have CHANGED during your visit.        Details   gabapentin 400 mg Capsule  Commonly known as: NEURONTIN  What changed: when to take this   400 mg, Oral, 2 TIMES DAILY  Refills: 0            CONTINUE these medications - NO CHANGES were made during your visit.        Details   albuterol  sulfate 90 mcg/actuation oral inhaler  Commonly known as:  PROVENTIL  or VENTOLIN  or PROAIR    1-2 Puffs, EVERY 6 HOURS PRN  Refills: 0     aspirin  81 mg Tablet, Delayed Release (E.C.)  Commonly known as: ECOTRIN   81 mg, Oral, Daily  Refills: 0     ATORVASTATIN  ORAL   80 mg, Daily  Refills: 0     bethanechol chloride 5 mg Tablet  Commonly known as: URECHOLINE   bethanechol chloride 5 mg tablet   TAKE ONE TABLET BY MOUTH ONCE EVERY DAY  Refills: 0     clopidogreL 75 mg Tablet  Commonly known as: PLAVIX   75 mg, Oral, Daily  Refills: 0     COMBIVENT INHL   1 Puff, 4 TIMES DAILY PRN  Refills: 0     cyanocobalamin 1,000 mcg/mL Solution  Commonly known as: VITAMIN B12   Inject 1 mL (1,000 mcg total) under the skin Every 14 days  Refills: 0     divalproex 250 mg Tablet Sustained Release 24 hr  Commonly known as: DEPAKOTE  ER   250 mg  Refills: 0     docusate sodium 100 mg Capsule  Commonly known as: COLACE   100 mg by oral route.  Refills: 0     DULoxetine  30 mg Capsule, Delayed Release(E.C.)  Commonly known as: CYMBALTA  DR   30 mg, 2 TIMES DAILY  Refills: 0     ergocalciferol  (vitamin D2) 1,250 mcg (50,000 unit) Capsule  Commonly known as: DRISDOL   50,000 Units, EVERY 7 DAYS  Refills: 0     famotidine  40 mg Tablet  Commonly known as: PEPCID    40 mg, Oral, 2 TIMES DAILY  Qty: 60 Tablet  Refills: 0     furosemide 20 mg Tablet  Commonly known as: LASIX   20 mg, DAILY PRN  Refills: 0     HUMALOG  KWIKPEN INSULIN  SUBQ   Inject under the skin 7-20 units tid before meals  Refills: 0     hydrOXYzine pamoate 25 mg Capsule  Commonly known as: VISTARIL   25 mg, NIGHTLY  Refills: 0     Levemir FlexPen 100 unit/mL (3 mL) Insulin  Pen  Generic drug: insulin  detemir U-100   60 Units, DAILY PRN  Refills: 0     lidocaine -prilocaine 2.5-2.5 % Cream  Commonly known as: EMLA   Apply topically to Port-A-Cath site 30 minutes prior to excessive port.  Qty: 30 g  Refills: 3     loratadine 10 mg Tablet  Commonly known as: CLARITIN   10 mg, DAILY PRN  Refills: 0     magnesium  chloride 64 mg Tablet, Delayed  Release (E.C.)  Commonly known as: SLOW-MAG   64 mg, Daily  Refills: 0     MetFORMIN 1,000 mg Tablet  Commonly known as: GLUCOPHAGE   1,000 mg, EVERY MORNING WITH BREAKFAST  Refills: 0     metoprolol  succinate 50 mg Tablet Sustained Release 24 hr  Commonly known as: TOPROL -XL   50 mg, Oral, Daily  Refills: 0     omeprazole 40 mg Capsule, Delayed Release(E.C.)  Commonly known as: PRILOSEC   40 mg, Oral, Daily  Refills: 0     ondansetron  8 mg Tablet, Rapid Dissolve  Commonly known as: ZOFRAN  ODT   8 mg, EVERY 8 HOURS PRN  Refills: 0     QUEtiapine  50 mg Tablet  Commonly known as: SEROquel    50 mg, Oral, NIGHTLY  Qty: 30 Tablet  Refills: 0     rOPINIRole  1 mg Tablet  Commonly known as: REQUIP    1 mg, 3 TIMES DAILY  Refills: 0     traZODone  100 mg Tablet  Commonly known as: DESYREL    100 mg, NIGHTLY  Refills: 0     Trulicity 0.75 mg/0.5 mL Pen Injector  Generic drug: dulaglutide   0.75 mg, EVERY 7 DAYS  Refills: 0            STOP taking these medications.      amLODIPine  10 mg Tablet  Commonly known as: NORVASC      chlorzoxazone  500 mg Tablet  Commonly known as: PARAFON  FORTE     cyclobenzaprine  10 mg Tablet  Commonly known as: FLEXERIL      cyclobenzaprine  5 mg Tablet  Commonly known as: FLEXERIL      doxycycline hyclate 100 mg Capsule  Commonly known as: VIBRAMYCIN  Replaced by: doxycycline hyclate 100 mg Tablet     lisinopriL 20 mg Tablet  Commonly known as: PRINIVIL     metoclopramide  100 mcg/mL Solution  Commonly known as: REGLAN   pregabalin  50 mg Capsule  Commonly known as: LYRICA      prochlorperazine  10 mg Tablet  Commonly known as: COMPAZINE      promethazine  25 mg Tablet  Commonly known as: PHENERGAN      rifAXIMin 550 mg Tablet  Commonly known as: XIFAXAN     tiZANidine  4 mg Tablet  Commonly known as: ZANAFLEX      traMADoL  50 mg Tablet  Commonly known as: ULTRAM      verapamiL 40 mg Tablet  Commonly known as: CALAN            Discharge med list refreshed?  YES     Allergies[1]  HOSPITAL PROCEDURE(S):   No orders  of the defined types were placed in this encounter.      REASON FOR HOSPITALIZATION AND HOSPITAL COURSE   BRIEF HPI:  Martha White is a 58 y.o., Black or African American female with past medical history of Chronic Obstructive Pulmonary Disease, asthma not on any home oxygen, diabetes, fibromyalgia, gastroparesis, hypertension, severe iron -deficiency anemia who came to [facility] ER due to abdominal pain.  Also she was found to be hypoxic, CT chest showed findings concerning for pneumonia.  Patient was placed on 2 L nasal cannula and transferred to [facility] for further management.     Patient seen and examined at bedside, reports low-grade fevers, cough.  Leg pains likely in the setting of fibromyalgia.     Of note patient was admitted to [facility] last month was found to be anemic and had severe iron -deficiency anemia.  EGD was done which showed superficial ulcers.  Colonoscopy could not be done due to poor prep and she was planned to do colonoscopy outpatient.    Patient during the hospital stay was started on IV antibiotics.  She did well.  He is on room air stable to be discharged to home.  Patient is on lot of psych and sedating medications.  I held some at discharge.  Patient needs to follow up with PCP regarding coming down on those medications.    CONDITION ON DISCHARGE:  A. Ambulation: Full ambulation  B. Self-care Ability: Complete  C. Cognitive Status Alert  D. Code status at discharge:       LINES/DRAINS/WOUNDS AT DISCHARGE:   Patient Lines/Drains/Airways Status       Active Line / Dialysis Catheter / Dialysis Graft / Drain / Airway / Wound       Name Placement date Placement time Site Days    Implantable Port Right Chest 01/01/24  1255  -- 1    Wound  Other (comment) Left 04/28/22  1844  -- 614    Wound   Left Throat 06/12/22  1215  -- 569    Wound  Incision Right Chest 01/29/23  --  -- 339                    DISCHARGE DISPOSITION:  Home discharge  DISCHARGE INSTRUCTIONS:  Post-Discharge Follow Up  Appointments       Follow up with Lennie Eleanor SAUNDERS, FNP    Phone: [phone]    Where: [address], [address] [facility] [address]      Thursday Jan 09, 2024    Infusion with CHAIR 16 - 229A, ONCOLOGY INFUSION PRN at  2:30 PM      Thursday Jan 16, 2024    Infusion with CHAIR 15 - 228B, ONCOLOGY INFUSION PRN at  2:30 PM      Thursday Jan 23, 2024  Infusion with CHAIR 01 - 220A, ONCOLOGY INFUSION PRN at  3:00 PM      Hematology/Oncology, [facility], [place]   [address]  [place] [id number]  [id number]          No discharge procedures on file.       Soledad Slater, MD    Copies sent to Care Team         Relationship Specialty Notifications Start End    Lennie Eleanor SAUNDERS, FNP PCP - General FAMILY NURSE PRACTITIONER  12/19/23     Phone: [phone] Fax: [phone]         [address] [place] [address]            Referring providers can utilize [email or web address] to access their referred [facility] Medicine patient's information.                               [1]   Allergies  Allergen Reactions    Alcohol  Shortness of Breath     Etoh, not rubbing alcohol       Sulfa (Sulfonamides) Anaphylaxis    Ibuprofen     Influenza Virus Vaccines     Influenza Virus Vaccine, Specific Hives/ Urticaria    Penicillins Hives/ Urticaria    Pneumococcal Vaccine Hives/ Urticaria

## 2024-01-03 NOTE — Nurses Notes (Signed)

## 2024-01-03 NOTE — Discharge Instructions (Addendum)
 On a lot of sedating meds, need to cut down on them  Decrease gabapentin  Stop Lyrica   Talk to your PCP regarding coming down on other meds

## 2024-01-05 LAB — ADULT ROUTINE BLOOD CULTURE, SET OF 2 BOTTLES (BACTERIA AND YEAST)
BLOOD CULTURE, ROUTINE: NO GROWTH
BLOOD CULTURE, ROUTINE: NO GROWTH

## 2024-01-06 ENCOUNTER — Telehealth (HOSPITAL_COMMUNITY): Payer: Self-pay

## 2024-01-06 LAB — PNH WITH FLAER (HIGH SENSITIVITY)
NUMBER OF MARKERS:: 8
VIABILITY (%): 70 %

## 2024-01-07 ENCOUNTER — Other Ambulatory Visit (INDEPENDENT_AMBULATORY_CARE_PROVIDER_SITE_OTHER): Payer: Self-pay | Admitting: NURSE PRACTITIONER

## 2024-01-08 ENCOUNTER — Other Ambulatory Visit (INDEPENDENT_AMBULATORY_CARE_PROVIDER_SITE_OTHER): Payer: Self-pay | Admitting: NURSE PRACTITIONER

## 2024-01-09 ENCOUNTER — Other Ambulatory Visit: Payer: Self-pay

## 2024-01-09 ENCOUNTER — Ambulatory Visit
Admission: RE | Admit: 2024-01-09 | Discharge: 2024-01-09 | Disposition: A | Discharge status: Home / Self Care | Payer: Self-pay | Source: Ambulatory Visit | Attending: NURSE PRACTITIONER | Admitting: NURSE PRACTITIONER

## 2024-01-09 ENCOUNTER — Emergency Department
Admission: EM | Admit: 2024-01-09 | Discharge: 2024-01-09 | Discharge status: Against Medical Advice | Attending: NURSE PRACTITIONER | Admitting: NURSE PRACTITIONER

## 2024-01-09 ENCOUNTER — Encounter (HOSPITAL_COMMUNITY): Payer: Self-pay

## 2024-01-09 ENCOUNTER — Encounter (HOSPITAL_COMMUNITY): Payer: Self-pay | Admitting: NURSE PRACTITIONER

## 2024-01-09 VITALS — BP 121/78 | HR 77 | Temp 97.5°F | Resp 18

## 2024-01-09 DIAGNOSIS — R197 Diarrhea, unspecified: Secondary | ICD-10-CM | POA: Insufficient documentation

## 2024-01-09 DIAGNOSIS — Z5321 Procedure and treatment not carried out due to patient leaving prior to being seen by health care provider: Secondary | ICD-10-CM

## 2024-01-09 DIAGNOSIS — D509 Iron deficiency anemia, unspecified: Secondary | ICD-10-CM | POA: Insufficient documentation

## 2024-01-09 DIAGNOSIS — R1110 Vomiting, unspecified: Secondary | ICD-10-CM | POA: Insufficient documentation

## 2024-01-09 DIAGNOSIS — R55 Syncope and collapse: Secondary | ICD-10-CM | POA: Insufficient documentation

## 2024-01-09 DIAGNOSIS — Z5329 Procedure and treatment not carried out because of patient's decision for other reasons: Secondary | ICD-10-CM | POA: Insufficient documentation

## 2024-01-09 DIAGNOSIS — E162 Hypoglycemia, unspecified: Secondary | ICD-10-CM | POA: Insufficient documentation

## 2024-01-09 LAB — PHOSPHORUS: PHOSPHORUS: 2.3 mg/dL — ABNORMAL LOW (ref 3.7–7.2)

## 2024-01-09 LAB — POC BLOOD GLUCOSE (RESULTS)
GLUCOSE, POC: 74 mg/dL (ref 70–100)
GLUCOSE, POC: 82 mg/dL (ref 70–100)

## 2024-01-09 MED ORDER — SODIUM CHLORIDE 0.9 % INTRAVENOUS SOLUTION
300.0000 mg | Freq: Once | INTRAVENOUS | Status: AC
Start: 2024-01-09 — End: 2024-01-09
  Administered 2024-01-09: 0 mg via INTRAVENOUS
  Administered 2024-01-09: 300 mg via INTRAVENOUS
  Filled 2024-01-09: qty 15

## 2024-01-09 MED ORDER — FAMOTIDINE (PF) 20 MG/2 ML INTRAVENOUS SOLUTION
20.0000 mg | Freq: Once | INTRAVENOUS | Status: DC | PRN
Start: 2024-01-09 — End: 2024-01-10
  Filled 2024-01-09: qty 2

## 2024-01-09 MED ORDER — ACETAMINOPHEN 325 MG TABLET
975.0000 mg | ORAL_TABLET | Freq: Once | ORAL | Status: DC | PRN
Start: 2024-01-09 — End: 2024-01-10

## 2024-01-09 MED ORDER — SODIUM CHLORIDE 0.9 % IV BOLUS
500.0000 mL | INJECTION | Freq: Once | Status: DC | PRN
Start: 2024-01-09 — End: 2024-01-10

## 2024-01-09 MED ORDER — SODIUM CHLORIDE 0.9 % IV BOLUS
1000.0000 mL | INJECTION | Freq: Once | Status: AC
Start: 2024-01-09 — End: 2024-01-09
  Administered 2024-01-09: 0 mL via INTRAVENOUS
  Administered 2024-01-09: 1000 mL via INTRAVENOUS

## 2024-01-09 MED ORDER — PROCHLORPERAZINE MALEATE 10 MG TABLET
10.0000 mg | ORAL_TABLET | Freq: Once | ORAL | Status: DC | PRN
Start: 2024-01-09 — End: 2024-01-10

## 2024-01-09 MED ORDER — SODIUM CHLORIDE 0.9% FLUSH BAG - 250 ML
INTRAVENOUS | Status: DC | PRN
Start: 2024-01-09 — End: 2024-01-10

## 2024-01-09 MED ORDER — HYDROCORTISONE SOD SUCCINATE 100 MG/2 ML VIAL WRAPPER
100.0000 mg | Freq: Once | INTRAMUSCULAR | Status: DC | PRN
Start: 2024-01-09 — End: 2024-01-10
  Filled 2024-01-09: qty 2

## 2024-01-09 MED ORDER — DEXTROSE 5% IN WATER (D5W) FLUSH BAG - 250 ML
INTRAVENOUS | Status: DC | PRN
Start: 2024-01-09 — End: 2024-01-10

## 2024-01-09 MED ORDER — ALBUTEROL SULFATE HFA 90 MCG/ACTUATION AEROSOL INHALER - RN
2.0000 | Freq: Once | RESPIRATORY_TRACT | Status: DC | PRN
Start: 2024-01-09 — End: 2024-01-10

## 2024-01-09 MED ORDER — EPINEPHRINE 1 MG/ML (1 ML) INJECTION SOLUTION
0.3000 mg | Freq: Once | INTRAMUSCULAR | Status: DC | PRN
Start: 2024-01-09 — End: 2024-01-10

## 2024-01-09 MED ORDER — ALBUTEROL SULFATE 2.5 MG/3 ML (0.083 %) SOLUTION FOR NEBULIZATION
2.5000 mg | INHALATION_SOLUTION | Freq: Once | RESPIRATORY_TRACT | Status: DC | PRN
Start: 2024-01-09 — End: 2024-01-10

## 2024-01-09 MED ORDER — LORATADINE 10 MG TABLET
20.0000 mg | ORAL_TABLET | Freq: Once | ORAL | Status: DC | PRN
Start: 2024-01-09 — End: 2024-01-10

## 2024-01-09 NOTE — Nurses Notes (Addendum)
 1432 patient arrived to floor ambulatory, patient accompanied by husband. Patient here for venofer  and one liter of hydration. Patient to room 227B. Husband at chair side. Leandrew Kitty, RN  1410 VSS. Assessment completed. Pain reported in shoulder, generalized. Patient states she simply doesn't feel good. Patient states venofer  infusions have made her legs cramp. Patient states no updates to allergies, home medications, and history. Food and drinks declined at this time.fall risk completed. Leandrew Kitty, RN  [phone] right chest port accessed. Labs collected, Leandrew Kitty, RN  1500 venofer  infusion started. 300 mg will infuse ober 2 hours due to patient leg cramps. Leandrew Kitty, RN  1501 ns bolus infusion started. Leandrew Kitty, RN  1601 ns bolus infusion completed. Leandrew Kitty, RN  [phone] iron  infusion stopped. Patient in chair, patient oriented to person, place, and time, patient slow to respond. Patients states her sugar is low, patients used dexcom to right upper arm, glucose 53, patient able to drink orange juice, glucose rechecked 64, patient drinks another orange juice and peanut butter crackers are provided. Patient states she is going to [facility] ER. Holley Montclair, RN flushed port and places curo cap. Husband and patient procceed to stand and demanded husband to place shoes on her without help from staff.  patient becomes unresponsive while standing with husband, patient helped to chair and assisted to floor, by two nursing staff. Patient placed in sitting position. Patient hard to arouse, Rapid Response called.  No visible deformity and did not hit head upon descent to floor, patient regains orientation and reports no pain, patient assisted back to chair.  Nursing supervisor and Rapid Response staff to bedside. Report given, nursing supervisor called ER and report was given. Patient transferred to ER via wheelchair. ER notified of patients labs, Phos 2.3, with no orders to replace from  ordering provider. Leandrew Kitty, RN

## 2024-01-09 NOTE — ED Provider Notes (Signed)
 [facility] - Emergency Department  ED Primary Note  History of Present Illness   Martha White is a 58 y.o. female who had concerns including Syncope and Low Blood Sugar.     Patient was not in room when went back to evaluate.  Patient eloped from the emergency room per RN    Past Medical History:   Past Medical History:   Diagnosis Date    Arthropathy     Asthma     Carpal tunnel syndrome     Carpal tunnel syndrome     Congestive heart failure     COPD (chronic obstructive pulmonary disease)     Depression     Diabetes mellitus, type 2     Fibromyalgia     Gastroparesis     Headache     HTN (hypertension)     Lumbar herniated disc     Neuropathy (CMS HCC)     Restless legs syndrome (RLS)     Thyroid  disease     TIA (transient ischemic attack)     TIA (transient ischemic attack)        Past Surgical History:   Past Surgical History:   Procedure Laterality Date    Hx cholecystectomy      Hx hernia repair      Hx hernia repair      Hx hysterectomy      Hx rotator cuff repair      Shoulder open rotator cuff repair Right     Stomach surgery         Social History: Social History[1]     Family History:  Family History   Problem Relation Age of Onset    Hypertension (High Blood Pressure) Mother     Hypertension (High Blood Pressure) Maternal Grandmother     Alzheimer's/Dementia Maternal Grandmother     Pancreatic Cancer Maternal Grandmother     Hypertension (High Blood Pressure) Maternal Grandfather     Heart Attack Maternal Grandfather      Oncology History    No problem history exists.        Physical Exam   ED Triage Vitals [01/09/24 1714]   BP (Non-Invasive) (!) 160/85   Heart Rate (!) 108   Respiratory Rate 16   Temperature 36.6 C (97.8 F)   SpO2 100 %   Weight 74.8 kg (165 lb)   Height 1.499 m (4' 11)     Patient eloped          Medications Prior to Admission       Prescriptions    albuterol  sulfate (PROVENTIL  OR VENTOLIN  OR PROAIR ) 90 mcg/actuation Inhalation oral inhaler    Take 1-2 Puffs  by inhalation Every 6 hours as needed    aspirin  (ECOTRIN) 81 mg Oral Tablet, Delayed Release (E.C.)    Take 1 Tablet (81 mg total) by mouth Daily    atorvastatin  calcium (ATORVASTATIN  ORAL)    Take 80 mg by mouth Once a day    bethanechol chloride (URECHOLINE) 5 mg Oral Tablet    bethanechol chloride 5 mg tablet   TAKE ONE TABLET BY MOUTH ONCE EVERY DAY    clopidogreL (PLAVIX) 75 mg Oral Tablet    Take 1 Tablet (75 mg total) by mouth Daily    cyanocobalamin (VITAMIN B12) 1,000 mcg/mL Injection Solution    Inject 1 mL (1,000 mcg total) under the skin Every 14 days    divalproex (DEPAKOTE ER) 250 mg Oral Tablet Sustained Release 24 hr  Take 1 Tablet (250 mg total) by mouth    docusate sodium (COLACE) 100 mg Oral Capsule    100 mg by oral route.    doxycycline hyclate 100 mg Oral Tablet    Take 1 Tablet (100 mg total) by mouth Twice daily for 5 days    dulaglutide (TRULICITY) 0.75 mg/0.5 mL Subcutaneous Pen Injector    Inject 0.5 mL (0.75 mg total) under the skin Every 7 days    DULoxetine  (CYMBALTA  DR) 30 mg Oral Capsule, Delayed Release(E.C.)    Take 1 Capsule (30 mg total) by mouth Twice daily    ergocalciferol , vitamin D2, (DRISDOL) 1,250 mcg (50,000 unit) Oral Capsule    Take 1 Capsule (50,000 Units total) by mouth Every 7 days    famotidine  (PEPCID ) 40 mg Oral Tablet    Take 1 Tablet (40 mg total) by mouth Twice daily for 30 days    furosemide (LASIX) 20 mg Oral Tablet    Take 1 Tablet (20 mg total) by mouth Once per day as needed    gabapentin (NEURONTIN) 400 mg Oral Capsule    Take 1 Capsule (400 mg total) by mouth Twice daily    hydrOXYzine pamoate (VISTARIL) 25 mg Oral Capsule    Take 1 Capsule (25 mg total) by mouth Every night    insulin  detemir U-100 (LEVEMIR FLEXPEN) 100 unit/mL (3 mL) Subcutaneous Insulin  Pen    Inject 60 Units under the skin Once per day as needed (SLIDING SCALE)    insulin  lispro (HUMALOG  KWIKPEN INSULIN  SUBQ)    Inject under the skin 7-20 units tid before meals     ipratropium/albuterol  sulfate (COMBIVENT INHL)    Take 1 Puff by inhalation Four times a day as needed    lidocaine -prilocaine (EMLA) 2.5-2.5 % Cream    Apply topically to Port-A-Cath site 30 minutes prior to excessive port.    loratadine (CLARITIN) 10 mg Oral Tablet    Take 1 Tablet (10 mg total) by mouth Once per day as needed    magnesium  chloride (SLOW-MAG) 64 mg Oral Tablet, Delayed Release (E.C.)    Take 1 Tablet (64 mg total) by mouth Daily Slow mag 71.5 mg-119 mg days 2 tablets daily    MetFORMIN (GLUCOPHAGE) 1,000 mg Oral Tablet    Take 1 Tablet (1,000 mg total) by mouth Every morning with breakfast    metoprolol  succinate (TOPROL -XL) 50 mg Oral Tablet Sustained Release 24 hr    Take 1 Tablet (50 mg total) by mouth Daily    naloxone (NARCAN) 4 mg per spray nasal spray    1 Spray by INTRANASAL route Every 2 minutes as needed for Other (actual or suspected opioid overdose) Call 911 if given.    omeprazole (PRILOSEC) 40 mg Oral Capsule, Delayed Release(E.C.)    Take 1 Capsule (40 mg total) by mouth Daily    ondansetron  (ZOFRAN  ODT) 8 mg Oral Tablet, Rapid Dissolve    Take 1 Tablet (8 mg total) by mouth Every 8 hours as needed for Nausea/Vomiting    oxyCODONE  (ROXICODONE ) 5 mg Oral Tablet    Take 0.5 Tablets (2.5 mg total) by mouth Every 12 hours as needed for Pain for up to 10 days    QUEtiapine  (SEROQUEL ) 50 mg Oral Tablet    Take 1 Tablet (50 mg total) by mouth Every night for 30 days    rOPINIRole  (REQUIP ) 1 mg Oral Tablet    Take 1 Tablet (1 mg total) by mouth Three times a day  traZODone  (DESYREL ) 100 mg Oral Tablet    Take 1 Tablet (100 mg total) by mouth Every night            Allergies: Allergies[2]    Above history reviewed with patient.  Allergies, medication list, reviewed.     Patient Data   Labs Ordered/Reviewed   POC BLOOD GLUCOSE (RESULTS) - Normal     No orders to display     Medical Decision Making        Medical Decision Making  Patient eloped                   Clinical Impression   Eloped  from emergency department (Primary)       Disposition: Eloped                  [1]   Social History  Tobacco Use    Smoking status: Never    Smokeless tobacco: Never   Vaping Use    Vaping status: Never Used   Substance Use Topics    Alcohol  use: Never    Drug use: Never   [2]   Allergies  Allergen Reactions    Alcohol  Shortness of Breath     Etoh, not rubbing alcohol       Sulfa (Sulfonamides) Anaphylaxis    Ibuprofen     Influenza Virus Vaccines     Influenza Virus Vaccine, Specific Hives/ Urticaria    Penicillins Hives/ Urticaria    Pneumococcal Vaccine Hives/ Urticaria

## 2024-01-09 NOTE — ED Nurses Note (Signed)
Patient eloped before treatment complete.

## 2024-01-09 NOTE — ED Triage Notes (Addendum)
 Rapid response - reports syncopal episode while getting up from sitting position - denies any injury. Staff report BS of 54, has had orange juice     Pt was receiving an iron  infusion

## 2024-01-10 ENCOUNTER — Encounter (HOSPITAL_COMMUNITY): Payer: Self-pay | Admitting: NURSE PRACTITIONER

## 2024-01-10 ENCOUNTER — Emergency Department
Admission: EM | Admit: 2024-01-10 | Discharge: 2024-01-10 | Disposition: A | Discharge status: Home / Self Care | Attending: NURSE PRACTITIONER | Admitting: NURSE PRACTITIONER

## 2024-01-10 ENCOUNTER — Other Ambulatory Visit: Payer: Self-pay

## 2024-01-10 ENCOUNTER — Encounter (HOSPITAL_BASED_OUTPATIENT_CLINIC_OR_DEPARTMENT_OTHER): Payer: Self-pay

## 2024-01-10 DIAGNOSIS — Z452 Encounter for adjustment and management of vascular access device: Secondary | ICD-10-CM | POA: Insufficient documentation

## 2024-01-10 MED ORDER — HEPARIN, PORCINE (PF) 100 UNIT/ML INTRAVENOUS SYRINGE
INJECTION | INTRAVENOUS | Status: AC
Start: 2024-01-10 — End: 2024-01-10
  Filled 2024-01-10: qty 5

## 2024-01-10 NOTE — ED Nurses Note (Signed)
 Portacath flushed and put heparin  in prior to being discharged Dressing applied over the portacath. Discharge papers given to the patient. Left ambulatory understanding instructions.

## 2024-01-10 NOTE — ED Provider Notes (Signed)
 [facility], [place] - Emergency Department  ED Primary Note  History of Present Illness   Suzzane Quilter is a 58 y.o. female who had concerns including Vascular Access Problem.   This is a 57 year old female who needs report Access removed.  Patient has no other complaints ROS negative other than what is stated in the HPI     Past Medical History:   Past Medical History:   Diagnosis Date    Arthropathy     Asthma     Carpal tunnel syndrome     Carpal tunnel syndrome     Congestive heart failure     COPD (chronic obstructive pulmonary disease)     Depression     Diabetes mellitus, type 2     Fibromyalgia     Gastroparesis     Headache     HTN (hypertension)     Lumbar herniated disc     Neuropathy (CMS HCC)     Restless legs syndrome (RLS)     Thyroid  disease     TIA (transient ischemic attack)     TIA (transient ischemic attack)        Past Surgical History:   Past Surgical History:   Procedure Laterality Date    Hx cholecystectomy      Hx hernia repair      Hx hernia repair      Hx hysterectomy      Hx rotator cuff repair      Shoulder open rotator cuff repair Right     Stomach surgery         Social History: Social History[1]     Family History:  Family History   Problem Relation Age of Onset    Hypertension (High Blood Pressure) Mother     Hypertension (High Blood Pressure) Maternal Grandmother     Alzheimer's/Dementia Maternal Grandmother     Pancreatic Cancer Maternal Grandmother     Hypertension (High Blood Pressure) Maternal Grandfather     Heart Attack Maternal Grandfather      Oncology History    No problem history exists.        Physical Exam   ED Triage Vitals [01/10/24 1519]   BP (Non-Invasive) (!) 142/81   Heart Rate (!) 101   Respiratory Rate 16   Temperature 36.3 C (97.4 F)   SpO2 98 %   Weight 74.8 kg (165 lb)   Height 1.499 m (4' 11)     Physical Exam  Vitals and nursing note reviewed.   Constitutional:       Appearance: Normal appearance. She is normal weight.   HENT:       Head: Normocephalic.      Right Ear: External ear normal.      Left Ear: External ear normal.      Nose: Nose normal.      Mouth/Throat:      Mouth: Mucous membranes are moist.      Pharynx: Oropharynx is clear.   Eyes:      Conjunctiva/sclera: Conjunctivae normal.      Pupils: Pupils are equal, round, and reactive to light.   Cardiovascular:      Rate and Rhythm: Normal rate and regular rhythm.      Pulses: Normal pulses.      Heart sounds: Normal heart sounds.   Pulmonary:      Effort: Pulmonary effort is normal.      Breath sounds: Normal breath sounds.   Abdominal:  General: Abdomen is flat. Bowel sounds are normal.      Palpations: Abdomen is soft.   Musculoskeletal:         General: Normal range of motion.      Cervical back: Normal range of motion.   Skin:     General: Skin is warm.      Capillary Refill: Capillary refill takes less than 2 seconds.   Neurological:      General: No focal deficit present.      Mental Status: She is alert and oriented to person, place, and time. Mental status is at baseline.   Psychiatric:         Mood and Affect: Mood normal.         Behavior: Behavior normal.         Medications Prior to Admission       Prescriptions    albuterol  sulfate (PROVENTIL  OR VENTOLIN  OR PROAIR ) 90 mcg/actuation Inhalation oral inhaler    Take 1-2 Puffs by inhalation Every 6 hours as needed    aspirin  (ECOTRIN) 81 mg Oral Tablet, Delayed Release (E.C.)    Take 1 Tablet (81 mg total) by mouth Daily    atorvastatin  calcium (ATORVASTATIN  ORAL)    Take 80 mg by mouth Once a day    bethanechol chloride (URECHOLINE) 5 mg Oral Tablet    bethanechol chloride 5 mg tablet   TAKE ONE TABLET BY MOUTH ONCE EVERY DAY    clopidogreL (PLAVIX) 75 mg Oral Tablet    Take 1 Tablet (75 mg total) by mouth Daily    cyanocobalamin (VITAMIN B12) 1,000 mcg/mL Injection Solution    Inject 1 mL (1,000 mcg total) under the skin Every 14 days    divalproex (DEPAKOTE ER) 250 mg Oral Tablet Sustained Release 24 hr    Take 1  Tablet (250 mg total) by mouth    docusate sodium (COLACE) 100 mg Oral Capsule    100 mg by oral route.    doxycycline hyclate 100 mg Oral Tablet    Take 1 Tablet (100 mg total) by mouth Twice daily for 5 days    dulaglutide (TRULICITY) 0.75 mg/0.5 mL Subcutaneous Pen Injector    Inject 0.5 mL (0.75 mg total) under the skin Every 7 days    DULoxetine  (CYMBALTA  DR) 30 mg Oral Capsule, Delayed Release(E.C.)    Take 1 Capsule (30 mg total) by mouth Twice daily    ergocalciferol , vitamin D2, (DRISDOL) 1,250 mcg (50,000 unit) Oral Capsule    Take 1 Capsule (50,000 Units total) by mouth Every 7 days    famotidine  (PEPCID ) 40 mg Oral Tablet    Take 1 Tablet (40 mg total) by mouth Twice daily for 30 days    furosemide (LASIX) 20 mg Oral Tablet    Take 1 Tablet (20 mg total) by mouth Once per day as needed    gabapentin (NEURONTIN) 400 mg Oral Capsule    Take 1 Capsule (400 mg total) by mouth Twice daily    hydrOXYzine pamoate (VISTARIL) 25 mg Oral Capsule    Take 1 Capsule (25 mg total) by mouth Every night    insulin  detemir U-100 (LEVEMIR FLEXPEN) 100 unit/mL (3 mL) Subcutaneous Insulin  Pen    Inject 60 Units under the skin Once per day as needed (SLIDING SCALE)    insulin  lispro (HUMALOG  KWIKPEN INSULIN  SUBQ)    Inject under the skin 7-20 units tid before meals    ipratropium/albuterol  sulfate (COMBIVENT INHL)    Take 1  Puff by inhalation Four times a day as needed    lidocaine -prilocaine (EMLA) 2.5-2.5 % Cream    Apply topically to Port-A-Cath site 30 minutes prior to excessive port.    loratadine (CLARITIN) 10 mg Oral Tablet    Take 1 Tablet (10 mg total) by mouth Once per day as needed    magnesium  chloride (SLOW-MAG) 64 mg Oral Tablet, Delayed Release (E.C.)    Take 1 Tablet (64 mg total) by mouth Daily Slow mag 71.5 mg-119 mg days 2 tablets daily    MetFORMIN (GLUCOPHAGE) 1,000 mg Oral Tablet    Take 1 Tablet (1,000 mg total) by mouth Every morning with breakfast    metoprolol  succinate (TOPROL -XL) 50 mg Oral Tablet  Sustained Release 24 hr    Take 1 Tablet (50 mg total) by mouth Daily    naloxone (NARCAN) 4 mg per spray nasal spray    1 Spray by INTRANASAL route Every 2 minutes as needed for Other (actual or suspected opioid overdose) Call 911 if given.    omeprazole (PRILOSEC) 40 mg Oral Capsule, Delayed Release(E.C.)    Take 1 Capsule (40 mg total) by mouth Daily    ondansetron  (ZOFRAN  ODT) 8 mg Oral Tablet, Rapid Dissolve    Take 1 Tablet (8 mg total) by mouth Every 8 hours as needed for Nausea/Vomiting    oxyCODONE  (ROXICODONE ) 5 mg Oral Tablet    Take 0.5 Tablets (2.5 mg total) by mouth Every 12 hours as needed for Pain for up to 10 days    QUEtiapine  (SEROQUEL ) 50 mg Oral Tablet    Take 1 Tablet (50 mg total) by mouth Every night for 30 days    rOPINIRole  (REQUIP ) 1 mg Oral Tablet    Take 1 Tablet (1 mg total) by mouth Three times a day    traZODone  (DESYREL ) 100 mg Oral Tablet    Take 1 Tablet (100 mg total) by mouth Every night            Allergies: Allergies[2]    Above history reviewed with patient.  Allergies, medication list, reviewed.     Patient Data   Labs Ordered/Reviewed - No data to display  No orders to display     Medical Decision Making        Medical Decision Making  Nursing notes reviewed.    Medical chart and diagnostic results reviewed.    Attending available for questioning consult.    Emergency department course:     58 year old female here to have her port access removed    No other complaints, low level visit    Access removed.  Patient's port was flushed by nursing tolerated well.  At this time patient is safe and stable for discharge home  I have discussed and counseled the patient and available family on the patient's condition, test results, treatment, and likely expected clinical course.  Patient was counseled on supportive care at home.  All questions and concerns were addressed at the bedside.  Patient verbalized understanding and is agreeable with the plan.  Patient should return to the ER  should new or worsening symptoms occur.  At this time,  patient is safe and stable for discharge home with strict return instructions.         Clinical Impression   Encounter for care related to vascular access port (Primary)       Disposition: Discharged                    [  1]   Social History  Tobacco Use    Smoking status: Never    Smokeless tobacco: Never   Vaping Use    Vaping status: Never Used   Substance Use Topics    Alcohol  use: Never    Drug use: Never   [2]   Allergies  Allergen Reactions    Alcohol  Shortness of Breath     Etoh, not rubbing alcohol       Sulfa (Sulfonamides) Anaphylaxis    Ibuprofen     Influenza Virus Vaccines     Influenza Virus Vaccine, Specific Hives/ Urticaria    Penicillins Hives/ Urticaria    Pneumococcal Vaccine Hives/ Urticaria

## 2024-01-10 NOTE — Discharge Instructions (Addendum)
 Please follow up with your primary care    Please return to the ER for any chest pain, shortness of breath, fever chills or anything that concerns you

## 2024-01-14 ENCOUNTER — Encounter (HOSPITAL_COMMUNITY): Payer: Self-pay | Admitting: NURSE PRACTITIONER

## 2024-01-14 ENCOUNTER — Other Ambulatory Visit (INDEPENDENT_AMBULATORY_CARE_PROVIDER_SITE_OTHER): Payer: Self-pay | Admitting: NURSE PRACTITIONER

## 2024-01-15 ENCOUNTER — Encounter (HOSPITAL_COMMUNITY): Payer: Self-pay | Admitting: NURSE PRACTITIONER

## 2024-01-15 ENCOUNTER — Other Ambulatory Visit (INDEPENDENT_AMBULATORY_CARE_PROVIDER_SITE_OTHER): Payer: Self-pay | Admitting: NURSE PRACTITIONER

## 2024-01-16 ENCOUNTER — Ambulatory Visit
Admission: RE | Admit: 2024-01-16 | Discharge: 2024-01-16 | Disposition: A | Discharge status: Home / Self Care | Payer: MEDICAID | Source: Ambulatory Visit | Attending: NURSE PRACTITIONER | Admitting: NURSE PRACTITIONER

## 2024-01-16 ENCOUNTER — Other Ambulatory Visit (INDEPENDENT_AMBULATORY_CARE_PROVIDER_SITE_OTHER): Payer: Self-pay | Admitting: NURSE PRACTITIONER

## 2024-01-16 ENCOUNTER — Other Ambulatory Visit: Payer: Self-pay

## 2024-01-16 ENCOUNTER — Ambulatory Visit: Payer: MEDICAID

## 2024-01-16 VITALS — BP 130/88 | HR 100 | Temp 97.5°F | Resp 18

## 2024-01-16 DIAGNOSIS — R1110 Vomiting, unspecified: Secondary | ICD-10-CM | POA: Insufficient documentation

## 2024-01-16 DIAGNOSIS — R197 Diarrhea, unspecified: Secondary | ICD-10-CM | POA: Insufficient documentation

## 2024-01-16 DIAGNOSIS — D509 Iron deficiency anemia, unspecified: Secondary | ICD-10-CM | POA: Insufficient documentation

## 2024-01-16 MED ORDER — PROCHLORPERAZINE MALEATE 10 MG TABLET
10.0000 mg | ORAL_TABLET | Freq: Once | ORAL | Status: AC | PRN
Start: 2024-01-16 — End: 2024-01-17

## 2024-01-16 MED ORDER — FAMOTIDINE (PF) 20 MG/2 ML INTRAVENOUS SOLUTION
20.0000 mg | Freq: Once | INTRAVENOUS | Status: AC | PRN
Start: 2024-01-16 — End: 2024-01-17

## 2024-01-16 MED ORDER — SODIUM CHLORIDE 0.9 % IV BOLUS
500.0000 mL | INJECTION | Freq: Once | Status: AC | PRN
Start: 2024-01-16 — End: 2024-01-17

## 2024-01-16 MED ORDER — EPINEPHRINE 1 MG/ML (1 ML) INJECTION SOLUTION
0.3000 mg | Freq: Once | INTRAMUSCULAR | Status: AC | PRN
Start: 2024-01-16 — End: 2024-01-17

## 2024-01-16 MED ORDER — ALBUTEROL SULFATE HFA 90 MCG/ACTUATION AEROSOL INHALER - RN
2.0000 | Freq: Once | RESPIRATORY_TRACT | Status: AC | PRN
Start: 2024-01-16 — End: 2024-01-17

## 2024-01-16 MED ORDER — LORATADINE 10 MG TABLET
20.0000 mg | ORAL_TABLET | Freq: Once | ORAL | Status: AC | PRN
Start: 2024-01-16 — End: 2024-01-17

## 2024-01-16 MED ORDER — SODIUM CHLORIDE 0.9 % INTRAVENOUS SOLUTION
10.0000 mmol | Freq: Once | INTRAVENOUS | Status: AC
Start: 2024-01-16 — End: 2024-01-16
  Administered 2024-01-16: 10 mmol via INTRAVENOUS
  Administered 2024-01-16: 0 mmol via INTRAVENOUS
  Filled 2024-01-16: qty 3.33

## 2024-01-16 MED ORDER — ACETAMINOPHEN 325 MG TABLET
975.0000 mg | ORAL_TABLET | Freq: Once | ORAL | Status: AC | PRN
Start: 2024-01-16 — End: 2024-01-17

## 2024-01-16 MED ORDER — SODIUM CHLORIDE 0.9 % INTRAVENOUS SOLUTION
300.0000 mg | Freq: Once | INTRAVENOUS | Status: AC
Start: 2024-01-16 — End: 2024-01-16
  Administered 2024-01-16: 0 mg via INTRAVENOUS
  Administered 2024-01-16: 300 mg via INTRAVENOUS
  Filled 2024-01-16: qty 15

## 2024-01-16 MED ORDER — SODIUM CHLORIDE 0.9% FLUSH BAG - 250 ML
INTRAVENOUS | Status: AC | PRN
Start: 2024-01-16 — End: ?

## 2024-01-16 MED ORDER — HYDROCORTISONE SOD SUCCINATE 100 MG/2 ML VIAL WRAPPER
100.0000 mg | Freq: Once | INTRAMUSCULAR | Status: AC | PRN
Start: 2024-01-16 — End: 2024-01-17

## 2024-01-16 MED ORDER — DEXTROSE 5% IN WATER (D5W) FLUSH BAG - 250 ML
INTRAVENOUS | Status: AC | PRN
Start: 2024-01-16 — End: ?

## 2024-01-16 MED ORDER — ALBUTEROL SULFATE 2.5 MG/3 ML (0.083 %) SOLUTION FOR NEBULIZATION
2.5000 mg | INHALATION_SOLUTION | Freq: Once | RESPIRATORY_TRACT | Status: AC | PRN
Start: 2024-01-16 — End: 2024-01-17

## 2024-01-16 MED ORDER — SODIUM CHLORIDE 0.9 % IV BOLUS
1000.0000 mL | INJECTION | Freq: Once | Status: AC
Start: 2024-01-16 — End: 2024-01-16
  Administered 2024-01-16: 1000 mL via INTRAVENOUS
  Administered 2024-01-16: 0 mL via INTRAVENOUS

## 2024-01-16 NOTE — Nurses Notes (Signed)
 9149 - Pt arrived ambulatory to OP ONC, here for treatment, assessment complete, VS/S, port accessed, flushed without difficulty, blood return present, transparent non bordered dressing applied. Satoru Milich, GN  9094 - NS bolus 1,000 mL infusion started. Jonette Kitty, GN  9062 - Venofer  300 mg infusion started. Alonna Bartling, GN  1005 - NS bolus infusion complete. [employer], GN  1107 - Venofer  infusion complete, pt tolerated infusion, line flushing. [employer], GN  1128 - Potassium phosphate 10 mmol in 250 mL NS infusion started. [employer], GN  1528 - potassium phosphate infusion complete, pt tolerated infusion well. Line flushing. [employer], GN  1536 - Port flushed, blood return present, port de accessed, pressure dressing applied, VS/S. [employer], GN  1542 - Pt left unit ambulatory without complaints. Jonette Kitty, GN

## 2024-01-20 ENCOUNTER — Telehealth (INDEPENDENT_AMBULATORY_CARE_PROVIDER_SITE_OTHER): Payer: Self-pay | Admitting: NURSE PRACTITIONER

## 2024-01-20 ENCOUNTER — Encounter (HOSPITAL_COMMUNITY): Payer: Self-pay | Admitting: NURSE PRACTITIONER

## 2024-01-20 NOTE — Telephone Encounter (Signed)
 Pt called complaining of having severe cramps both legs/back/both hands both feet and off places at a time. Is there something that she can do or have done to stop this.  I spoke to [facility] about this she said all the levels didn't look bad and she probably needed to call her PCP, go to urgent care or the ER. I'll talk to [facility] tomorrow and if she says something different I would let her know.

## 2024-01-21 ENCOUNTER — Other Ambulatory Visit (INDEPENDENT_AMBULATORY_CARE_PROVIDER_SITE_OTHER): Payer: Self-pay | Admitting: NURSE PRACTITIONER

## 2024-01-22 ENCOUNTER — Encounter (HOSPITAL_COMMUNITY): Payer: Self-pay | Admitting: NURSE PRACTITIONER

## 2024-01-22 ENCOUNTER — Other Ambulatory Visit (INDEPENDENT_AMBULATORY_CARE_PROVIDER_SITE_OTHER): Payer: Self-pay | Admitting: NURSE PRACTITIONER

## 2024-01-23 ENCOUNTER — Other Ambulatory Visit: Payer: Self-pay

## 2024-01-23 ENCOUNTER — Encounter (HOSPITAL_COMMUNITY): Payer: Self-pay

## 2024-01-23 ENCOUNTER — Ambulatory Visit
Admission: RE | Admit: 2024-01-23 | Discharge: 2024-01-23 | Disposition: A | Discharge status: Home / Self Care | Payer: Self-pay | Source: Ambulatory Visit

## 2024-01-23 VITALS — BP 129/74 | HR 100 | Temp 97.9°F | Resp 18

## 2024-01-23 DIAGNOSIS — R1110 Vomiting, unspecified: Secondary | ICD-10-CM | POA: Insufficient documentation

## 2024-01-23 DIAGNOSIS — R252 Cramp and spasm: Secondary | ICD-10-CM | POA: Insufficient documentation

## 2024-01-23 DIAGNOSIS — R197 Diarrhea, unspecified: Secondary | ICD-10-CM | POA: Insufficient documentation

## 2024-01-23 DIAGNOSIS — D72819 Decreased white blood cell count, unspecified: Secondary | ICD-10-CM

## 2024-01-23 DIAGNOSIS — D509 Iron deficiency anemia, unspecified: Secondary | ICD-10-CM | POA: Insufficient documentation

## 2024-01-23 DIAGNOSIS — Z789 Other specified health status: Secondary | ICD-10-CM

## 2024-01-23 LAB — MAGNESIUM: MAGNESIUM: 1.9 mg/dL (ref 1.9–2.7)

## 2024-01-23 LAB — PHOSPHORUS: PHOSPHORUS: 1.6 mg/dL — ABNORMAL LOW (ref 3.7–7.2)

## 2024-01-23 LAB — POC BLOOD GLUCOSE (RESULTS): GLUCOSE, POC: 135 mg/dL — ABNORMAL HIGH (ref 70–100)

## 2024-01-23 MED ORDER — ALBUTEROL SULFATE 2.5 MG/3 ML (0.083 %) SOLUTION FOR NEBULIZATION
2.5000 mg | INHALATION_SOLUTION | Freq: Once | RESPIRATORY_TRACT | Status: DC | PRN
Start: 2024-01-23 — End: 2024-01-24

## 2024-01-23 MED ORDER — SODIUM CHLORIDE 0.9 % IV BOLUS
1000.0000 mL | INJECTION | Freq: Once | Status: AC
Start: 2024-01-23 — End: 2024-01-23
  Administered 2024-01-23: 0 mL via INTRAVENOUS
  Administered 2024-01-23: 1000 mL via INTRAVENOUS

## 2024-01-23 MED ORDER — ALBUTEROL SULFATE HFA 90 MCG/ACTUATION AEROSOL INHALER - RN
2.0000 | Freq: Once | RESPIRATORY_TRACT | Status: DC | PRN
Start: 2024-01-23 — End: 2024-01-24

## 2024-01-23 MED ORDER — FAMOTIDINE (PF) 20 MG/2 ML INTRAVENOUS SOLUTION
20.0000 mg | Freq: Once | INTRAVENOUS | Status: DC | PRN
Start: 2024-01-23 — End: 2024-01-24

## 2024-01-23 MED ORDER — LORATADINE 10 MG TABLET
20.0000 mg | ORAL_TABLET | Freq: Once | ORAL | Status: DC | PRN
Start: 2024-01-23 — End: 2024-01-24

## 2024-01-23 MED ORDER — SODIUM CHLORIDE 0.9 % (FLUSH) INJECTION SYRINGE
20.0000 mL | INJECTION | INTRAMUSCULAR | Status: DC | PRN
Start: 2024-01-23 — End: 2024-01-24

## 2024-01-23 MED ORDER — ACETAMINOPHEN 325 MG TABLET
975.0000 mg | ORAL_TABLET | Freq: Once | ORAL | Status: DC | PRN
Start: 2024-01-23 — End: 2024-01-24

## 2024-01-23 MED ORDER — EPINEPHRINE 1 MG/ML (1 ML) INJECTION SOLUTION
0.3000 mg | Freq: Once | INTRAMUSCULAR | Status: DC | PRN
Start: 2024-01-23 — End: 2024-01-24

## 2024-01-23 MED ORDER — SODIUM CHLORIDE 0.9 % INTRAVENOUS SOLUTION
300.0000 mg | Freq: Once | INTRAVENOUS | Status: AC
Start: 2024-01-23 — End: 2024-01-23
  Administered 2024-01-23: 0 mg via INTRAVENOUS
  Administered 2024-01-23: 300 mg via INTRAVENOUS
  Filled 2024-01-23: qty 15

## 2024-01-23 MED ORDER — PROCHLORPERAZINE MALEATE 10 MG TABLET
10.0000 mg | ORAL_TABLET | Freq: Once | ORAL | Status: DC | PRN
Start: 2024-01-23 — End: 2024-01-24

## 2024-01-23 MED ORDER — SODIUM CHLORIDE 0.9% FLUSH BAG - 250 ML
INTRAVENOUS | Status: DC | PRN
Start: 2024-01-23 — End: 2024-01-24

## 2024-01-23 MED ORDER — SODIUM CHLORIDE 0.9 % IV BOLUS
500.0000 mL | INJECTION | Freq: Once | Status: DC | PRN
Start: 2024-01-23 — End: 2024-01-24

## 2024-01-23 MED ORDER — DEXTROSE 5% IN WATER (D5W) FLUSH BAG - 250 ML
INTRAVENOUS | Status: DC | PRN
Start: 2024-01-23 — End: 2024-01-24

## 2024-01-23 MED ORDER — SODIUM CHLORIDE 0.9 % INTRAVENOUS SOLUTION
10.0000 mmol | Freq: Once | INTRAVENOUS | Status: AC
Start: 2024-01-23 — End: 2024-01-23
  Administered 2024-01-23: 10 mmol via INTRAVENOUS
  Administered 2024-01-23: 0 mmol via INTRAVENOUS
  Filled 2024-01-23: qty 3.33

## 2024-01-23 MED ORDER — HYDROCORTISONE SOD SUCCINATE 100 MG/2 ML VIAL WRAPPER
100.0000 mg | Freq: Once | INTRAMUSCULAR | Status: DC | PRN
Start: 2024-01-23 — End: 2024-01-24

## 2024-01-23 NOTE — Nurses Notes (Signed)
**Note De-Identified Quinci Gavidia Obfuscation** 9083- Patient arrived ambulatory to outpatient oncology. VS and assessment completed. Port accessed to the right chest, flushed without difficulty and blood return noted. Sharisa Toves, RN  [phone]- NS bolus started at this time. Andrez Timesha Cervantez, RN  [phone]- Phosphorus 1.6. and magnesium  1.9. Kaleia Longhi, RN  1042- NS bolus complete at this time. Jeilani Grupe, RN  1052- Venofer  infusion started at this time. Taffany Heiser, RN   [phone]- Venofer  infusion completed at this time and flushing. Tolerated well. Adley Mazurowski, RN   [phone]- Potassium phosphate infusion started at this time. [place], RN  [phone]- Potassium phosphate infusion complete and flushing. Tolerated well. Serrita Lueth, RN  1640- Port deaccessed from the right chest, needle intact, and a dressing applied. Evyn Putzier, RN  [phone]- Ambulatory off the unit at this time. Andrez Margalit Leece, RN

## 2024-01-24 ENCOUNTER — Encounter (HOSPITAL_COMMUNITY): Payer: Self-pay

## 2024-01-24 ENCOUNTER — Encounter (HOSPITAL_COMMUNITY): Payer: Self-pay | Admitting: NURSE PRACTITIONER

## 2024-01-24 ENCOUNTER — Other Ambulatory Visit (INDEPENDENT_AMBULATORY_CARE_PROVIDER_SITE_OTHER): Payer: Self-pay | Admitting: NURSE PRACTITIONER

## 2024-01-29 ENCOUNTER — Other Ambulatory Visit (INDEPENDENT_AMBULATORY_CARE_PROVIDER_SITE_OTHER): Payer: Self-pay | Admitting: NURSE PRACTITIONER

## 2024-01-29 ENCOUNTER — Other Ambulatory Visit: Payer: Self-pay

## 2024-01-29 ENCOUNTER — Ambulatory Visit
Admission: RE | Admit: 2024-01-29 | Discharge: 2024-01-29 | Disposition: A | Discharge status: Home / Self Care | Source: Ambulatory Visit | Attending: NURSE PRACTITIONER | Admitting: NURSE PRACTITIONER

## 2024-01-29 ENCOUNTER — Encounter (HOSPITAL_COMMUNITY): Payer: Self-pay | Admitting: NURSE PRACTITIONER

## 2024-01-29 ENCOUNTER — Encounter (HOSPITAL_COMMUNITY): Payer: Self-pay

## 2024-01-29 VITALS — BP 159/80 | HR 103 | Temp 98.0°F | Resp 20

## 2024-01-29 DIAGNOSIS — R252 Cramp and spasm: Secondary | ICD-10-CM

## 2024-01-29 DIAGNOSIS — R197 Diarrhea, unspecified: Secondary | ICD-10-CM | POA: Insufficient documentation

## 2024-01-29 DIAGNOSIS — D509 Iron deficiency anemia, unspecified: Secondary | ICD-10-CM | POA: Insufficient documentation

## 2024-01-29 DIAGNOSIS — R1110 Vomiting, unspecified: Secondary | ICD-10-CM | POA: Insufficient documentation

## 2024-01-29 LAB — POTASSIUM: POTASSIUM: 4 mmol/L (ref 3.5–5.1)

## 2024-01-29 LAB — PHOSPHORUS: PHOSPHORUS: 1.6 mg/dL — ABNORMAL LOW (ref 3.7–7.2)

## 2024-01-29 LAB — MAGNESIUM: MAGNESIUM: 1.9 mg/dL (ref 1.9–2.7)

## 2024-01-29 MED ORDER — SODIUM CHLORIDE 0.9 % IV BOLUS
500.0000 mL | INJECTION | Freq: Once | Status: DC | PRN
Start: 2024-01-29 — End: 2024-01-30

## 2024-01-29 MED ORDER — PROCHLORPERAZINE MALEATE 10 MG TABLET
10.0000 mg | ORAL_TABLET | Freq: Once | ORAL | Status: DC | PRN
Start: 2024-01-29 — End: 2024-01-30

## 2024-01-29 MED ORDER — LORATADINE 10 MG TABLET
20.0000 mg | ORAL_TABLET | Freq: Once | ORAL | Status: DC | PRN
Start: 2024-01-29 — End: 2024-01-30

## 2024-01-29 MED ORDER — ACETAMINOPHEN 325 MG TABLET
975.0000 mg | ORAL_TABLET | Freq: Once | ORAL | Status: DC | PRN
Start: 2024-01-29 — End: 2024-01-30

## 2024-01-29 MED ORDER — SODIUM CHLORIDE 0.9 % INTRAVENOUS SOLUTION
300.0000 mg | Freq: Once | INTRAVENOUS | Status: AC
Start: 2024-01-29 — End: 2024-01-29
  Administered 2024-01-29: 300 mg via INTRAVENOUS
  Administered 2024-01-29: 0 mg via INTRAVENOUS
  Filled 2024-01-29: qty 15

## 2024-01-29 MED ORDER — EPINEPHRINE 1 MG/ML (1 ML) INJECTION SOLUTION
0.3000 mg | Freq: Once | INTRAMUSCULAR | Status: DC | PRN
Start: 2024-01-29 — End: 2024-01-30

## 2024-01-29 MED ORDER — SODIUM CHLORIDE 0.9 % INTRAVENOUS SOLUTION
10.0000 mmol | Freq: Once | INTRAVENOUS | Status: AC
Start: 2024-01-29 — End: 2024-01-29
  Administered 2024-01-29: 0 mmol via INTRAVENOUS
  Administered 2024-01-29: 10 mmol via INTRAVENOUS
  Filled 2024-01-29: qty 3.33

## 2024-01-29 MED ORDER — SODIUM CHLORIDE 0.9% FLUSH BAG - 250 ML
INTRAVENOUS | Status: DC | PRN
Start: 2024-01-29 — End: 2024-01-30

## 2024-01-29 MED ORDER — HYDROCORTISONE SOD SUCCINATE 100 MG/2 ML VIAL WRAPPER
100.0000 mg | Freq: Once | INTRAMUSCULAR | Status: DC | PRN
Start: 2024-01-29 — End: 2024-01-30

## 2024-01-29 MED ORDER — FAMOTIDINE (PF) 20 MG/2 ML INTRAVENOUS SOLUTION
20.0000 mg | Freq: Once | INTRAVENOUS | Status: DC | PRN
Start: 2024-01-29 — End: 2024-01-30

## 2024-01-29 MED ORDER — ALBUTEROL SULFATE HFA 90 MCG/ACTUATION AEROSOL INHALER - RN
2.0000 | Freq: Once | RESPIRATORY_TRACT | Status: DC | PRN
Start: 2024-01-29 — End: 2024-01-30

## 2024-01-29 MED ORDER — DEXTROSE 5% IN WATER (D5W) FLUSH BAG - 250 ML
INTRAVENOUS | Status: DC | PRN
Start: 2024-01-29 — End: 2024-01-30

## 2024-01-29 MED ORDER — ALBUTEROL SULFATE 2.5 MG/3 ML (0.083 %) SOLUTION FOR NEBULIZATION
2.5000 mg | INHALATION_SOLUTION | Freq: Once | RESPIRATORY_TRACT | Status: DC | PRN
Start: 2024-01-29 — End: 2024-01-30

## 2024-01-29 NOTE — Nurses Notes (Signed)
 Summary-Patient arrived and received iv iron  per order. Patient ask about phosphorus level. Message sent to T.McKenzie FNP and orders placed for phosphorus level and replacement. Patient tolerated well. PAC deaccess per policy. Patient instructed to schedule follow with provider for future orders. Verbalized understanding. Ambulated out of facility.Lura Budge, LPN

## 2024-01-30 ENCOUNTER — Ambulatory Visit (INDEPENDENT_AMBULATORY_CARE_PROVIDER_SITE_OTHER)
Admission: RE | Admit: 2024-01-30 | Discharge: 2024-01-30 | Disposition: A | Discharge status: Home / Self Care | Source: Ambulatory Visit | Attending: NURSE PRACTITIONER | Admitting: NURSE PRACTITIONER

## 2024-01-30 ENCOUNTER — Other Ambulatory Visit: Payer: Self-pay

## 2024-01-30 ENCOUNTER — Ambulatory Visit: Attending: NURSE PRACTITIONER | Admitting: NURSE PRACTITIONER

## 2024-01-30 ENCOUNTER — Ambulatory Visit (HOSPITAL_COMMUNITY): Payer: Self-pay

## 2024-01-30 ENCOUNTER — Ambulatory Visit (HOSPITAL_BASED_OUTPATIENT_CLINIC_OR_DEPARTMENT_OTHER)
Admission: RE | Admit: 2024-01-30 | Discharge: 2024-01-30 | Disposition: A | Discharge status: Home / Self Care | Source: Ambulatory Visit | Attending: NURSE PRACTITIONER | Admitting: NURSE PRACTITIONER

## 2024-01-30 ENCOUNTER — Encounter (INDEPENDENT_AMBULATORY_CARE_PROVIDER_SITE_OTHER): Payer: Self-pay | Admitting: NURSE PRACTITIONER

## 2024-01-30 DIAGNOSIS — R748 Abnormal levels of other serum enzymes: Secondary | ICD-10-CM

## 2024-01-30 DIAGNOSIS — E860 Dehydration: Secondary | ICD-10-CM

## 2024-01-30 DIAGNOSIS — M545 Low back pain, unspecified: Secondary | ICD-10-CM

## 2024-01-30 DIAGNOSIS — M4316 Spondylolisthesis, lumbar region: Secondary | ICD-10-CM

## 2024-01-30 DIAGNOSIS — M5136 Other intervertebral disc degeneration, lumbar region with discogenic back pain only: Secondary | ICD-10-CM

## 2024-01-30 DIAGNOSIS — R159 Full incontinence of feces: Secondary | ICD-10-CM

## 2024-01-30 DIAGNOSIS — K9589 Other complications of other bariatric procedure: Secondary | ICD-10-CM

## 2024-01-30 DIAGNOSIS — M48061 Spinal stenosis, lumbar region without neurogenic claudication: Secondary | ICD-10-CM

## 2024-01-30 DIAGNOSIS — K910 Vomiting following gastrointestinal surgery: Secondary | ICD-10-CM

## 2024-01-30 DIAGNOSIS — R32 Unspecified urinary incontinence: Secondary | ICD-10-CM

## 2024-01-30 DIAGNOSIS — E559 Vitamin D deficiency, unspecified: Secondary | ICD-10-CM

## 2024-01-30 DIAGNOSIS — D509 Iron deficiency anemia, unspecified: Secondary | ICD-10-CM

## 2024-01-30 DIAGNOSIS — K912 Postsurgical malabsorption, not elsewhere classified: Secondary | ICD-10-CM

## 2024-01-30 DIAGNOSIS — G629 Polyneuropathy, unspecified: Secondary | ICD-10-CM

## 2024-01-30 DIAGNOSIS — Z903 Acquired absence of stomach [part of]: Secondary | ICD-10-CM

## 2024-01-30 LAB — COMPREHENSIVE METABOLIC PANEL, NON-FASTING
ALBUMIN/GLOBULIN RATIO: 1.3 (ref 0.8–1.4)
ALBUMIN: 3.4 g/dL — ABNORMAL LOW (ref 3.5–5.7)
ALKALINE PHOSPHATASE: 96 U/L (ref 34–104)
ALT (SGPT): 247 U/L — ABNORMAL HIGH (ref 7–52)
ANION GAP: 4 mmol/L (ref 4–13)
AST (SGOT): 271 U/L — ABNORMAL HIGH (ref 13–39)
BILIRUBIN TOTAL: 0.3 mg/dL (ref 0.3–1.0)
BUN/CREA RATIO: 19 (ref 6–22)
BUN: 17 mg/dL (ref 7–25)
CALCIUM, CORRECTED: 8.9 mg/dL (ref 8.9–10.8)
CALCIUM: 8.4 mg/dL — ABNORMAL LOW (ref 8.6–10.3)
CHLORIDE: 111 mmol/L — ABNORMAL HIGH (ref 98–107)
CO2 TOTAL: 24 mmol/L (ref 21–31)
CREATININE: 0.91 mg/dL (ref 0.60–1.30)
ESTIMATED GFR: 72 mL/min/1.73mˆ2 (ref >59)
GLOBULIN: 2.7 (ref 2.0–3.5)
GLUCOSE: 72 mg/dL — ABNORMAL LOW (ref 74–109)
OSMOLALITY, CALCULATED: 278 mosm/kg (ref 270–290)
POTASSIUM: 4.3 mmol/L (ref 3.5–5.1)
PROTEIN TOTAL: 6.1 g/dL — ABNORMAL LOW (ref 6.4–8.9)
SODIUM: 139 mmol/L (ref 136–145)

## 2024-01-30 LAB — FERRITIN: FERRITIN: 913 ng/mL — ABNORMAL HIGH (ref 11–307)

## 2024-01-30 LAB — IRON TRANSFERRIN AND TIBC
IRON (TRANSFERRIN) SATURATION: 110 % — ABNORMAL HIGH (ref 15–50)
IRON: 349 ug/dL — ABNORMAL HIGH (ref 50–212)
TOTAL IRON BINDING CAPACITY: 316 ug/dL (ref 250–450)
TRANSFERRIN: 226 mg/dL (ref 203–362)
UIBC: -33 ug/dL — ABNORMAL LOW (ref 130–375)

## 2024-01-30 MED ORDER — GADOBUTROL 10 MMOL/10 ML (1 MMOL/ML) INTRAVENOUS SOLUTION
10.0000 mL | INTRAVENOUS | Status: AC
Start: 2024-01-30 — End: 2024-01-30
  Administered 2024-01-30: 7.5 mL via INTRAVENOUS

## 2024-01-30 MED ORDER — DIAZEPAM 2 MG TABLET: 2.0000 mg | ORAL_TABLET | ORAL | 0 refills | Status: DC

## 2024-01-30 NOTE — Cancer Center Note (Signed)
 Martha White  [id number]  Aug 22, 1965   01/30/2024       Department of Hematology/Oncology  Return Patient Visit           REFERRING PROVIDER:  Lennie Eleanor SAUNDERS, FNP  [address]  [address]  [place],  [place] [address]    PCP:  Eleanor SAUNDERS Lennie, FNP  [address]  [place] [address]          REASON FOR OFFICE VISIT:  Ongoing management and evaluation of  Low phosphorus, low iron ,       HISTORY OF PRESENT ILLNESS:  History of Present Illness  Martha White is a 57 year old female with acute on chronic congestive heart failure who presents with worsening symptoms of incontinence and back pain.    Congestive heart failure and edema  - Acute on chronic congestive heart failure with recent hospitalization on October 14th  - Treated with Lasix  during hospitalization despite normal BNP levels  - Chest CT performed during admission  - Diagnosed and treated for pneumonia during hospitalization  - Significant bilateral lower extremity and pedal edema, improves in the morning and worsens by midday  - Severe cramping in legs, feet, and hands, occurring regardless of swelling and severe enough to induce vomiting    Fatigue and exercise intolerance  - Significant fatigue with lack of energy and strength  - Difficulty ambulating short distances    Gastrointestinal symptoms  - History of gastroparesis  - Cholecystectomy performed due to poor gallbladder function, without symptom relief  - Loss of taste with food tasting unpleasant    Bowel and bladder incontinence  - New onset bowel and bladder incontinence for the past 2-3 weeks  - Requires multiple pairs of underwear daily    Back pain and neurological symptoms  - Chronic back pain related to bulging and herniated discs at L3, L4, and L5  - New stabbing pain bilaterally in the back, radiating to the top of the buttocks  - 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  - Numbness in legs and hands  - Intermittent purple discoloration of  hands    Laboratory abnormalities and dehydration  - History of elevated liver enzymes, with higher levels at hospital discharge compared to admission  - Progressive decrease in phosphorus levels despite supplementation  - Ongoing dehydration, receiving intravenous fluids    Iron  deficiency and supplementation  - Receiving iron  infusions with improvement in iron  levels    Diabetes mellitus  - History of elevated hemoglobin A1c levels, previously precluding back surgery          REVIEW OF SYSTEMS:    Past Medical History:   Diagnosis Date    Arthropathy     Asthma     Carpal tunnel syndrome     Carpal tunnel syndrome     Congestive heart failure     COPD (chronic obstructive pulmonary disease)     Depression     Diabetes mellitus, type 2     Fibromyalgia     Gastroparesis     Headache     HTN (hypertension)     Lumbar herniated disc     Neuropathy (CMS HCC)     Restless legs syndrome (RLS)     Thyroid  disease     TIA (transient ischemic attack)     TIA (transient ischemic attack)            Past Surgical History:   Procedure Laterality  Date    HX CHOLECYSTECTOMY      HX HERNIA REPAIR      HX HERNIA REPAIR      HX HYSTERECTOMY      HX ROTATOR CUFF REPAIR      SHOULDER OPEN ROTATOR CUFF REPAIR Right     STOMACH SURGERY      X4           Social History     Socioeconomic History    Marital status: Married     Spouse name: Not on file    Number of children: Not on file    Years of education: Not on file    Highest education level: Not on file   Occupational History    Not on file   Tobacco Use    Smoking status: Never    Smokeless tobacco: Never   Vaping Use    Vaping status: Never Used   Substance and Sexual Activity    Alcohol  use: Never    Drug use: Never    Sexual activity: Never   Other Topics Concern    Not on file   Social History Narrative    Not on file     Social Determinants of Health     Financial Resource Strain: Not on file   Transportation Needs: Not on file   Social Connections: Low Risk (01/01/2024)     Social Connections     SDOH Social Isolation: 5 or more times a week   Recent Concern: Social Connections - Medium Risk (12/07/2023)    Social Connections     SDOH Social Isolation: 3 to 5 times a week   Intimate Partner Violence: Not on file   Housing Stability: Not on file       Social History     Social History Narrative    Not on file       Social History     Substance and Sexual Activity   Drug Use Never       Family Medical History:       Problem Relation (Age of Onset)    Alzheimer's/Dementia Maternal Grandmother    Heart Attack Maternal Grandfather    Hypertension (High Blood Pressure) Mother, Maternal Grandmother, Maternal Grandfather    Pancreatic Cancer Maternal Grandmother              Current Outpatient Medications   Medication Sig    albuterol  sulfate (PROVENTIL  OR VENTOLIN  OR PROAIR ) 90 mcg/actuation Inhalation oral inhaler Take 1-2 Puffs by inhalation Every 6 hours as needed    amLODIPine  (NORVASC ) 10 mg Oral Tablet Take 1 Tablet (10 mg total) by mouth Daily    aspirin  (ECOTRIN) 81 mg Oral Tablet, Delayed Release (E.C.) Take 1 Tablet (81 mg total) by mouth Daily    atorvastatin  calcium (ATORVASTATIN  ORAL) Take 80 mg by mouth Once a day    bethanechol chloride (URECHOLINE) 5 mg Oral Tablet bethanechol chloride 5 mg tablet   TAKE ONE TABLET BY MOUTH ONCE EVERY DAY    clindamycin phosphate (CLEOCIN) 2 % Vaginal Cream USE 1 APPLICATORFUL VAGINALLY AS DIRECTED NIGHTLY AT BEDTIME FOR 7 NIGHTS    clopidogreL  (PLAVIX ) 75 mg Oral Tablet Take 1 Tablet (75 mg total) by mouth Daily    cyanocobalamin (VITAMIN B12) 1,000 mcg/mL Injection Solution Inject 1 mL (1,000 mcg total) under the skin Every 14 days    cyclobenzaprine  (FLEXERIL ) 5 mg Oral Tablet Take 1 Tablet (5 mg total) by mouth Twice daily  diazePAM  (VALIUM ) 2 mg Oral Tablet Take 1 Tablet (2 mg total) by mouth Per instructions For MRI.  May repeat x 1 if needed to complete MRI    divalproex  (DEPAKOTE  ER) 250 mg Oral Tablet Sustained Release 24 hr Take 1  Tablet (250 mg total) by mouth    docusate sodium (COLACE) 100 mg Oral Capsule 100 mg by oral route.    dulaglutide (TRULICITY) 0.75 mg/0.5 mL Subcutaneous Pen Injector Inject 0.5 mL (0.75 mg total) under the skin Every 7 days    DULoxetine  (CYMBALTA  DR) 30 mg Oral Capsule, Delayed Release(E.C.) Take 1 Capsule (30 mg total) by mouth Twice daily    ergocalciferol , vitamin D2, (DRISDOL) 1,250 mcg (50,000 unit) Oral Capsule Take 1 Capsule (50,000 Units total) by mouth Every 7 days    famotidine  (PEPCID ) 40 mg Oral Tablet Take 1 Tablet (40 mg total) by mouth Twice daily for 30 days    furosemide  (LASIX ) 20 mg Oral Tablet Take 1 Tablet (20 mg total) by mouth Once per day as needed    gabapentin  (NEURONTIN ) 400 mg Oral Capsule Take 1 Capsule (400 mg total) by mouth Twice daily    hydrOXYzine  pamoate (VISTARIL ) 25 mg Oral Capsule Take 1 Capsule (25 mg total) by mouth Every night    insulin  detemir U-100 (LEVEMIR FLEXPEN) 100 unit/mL (3 mL) Subcutaneous Insulin  Pen Inject 60 Units under the skin Once per day as needed (SLIDING SCALE)    insulin  lispro (HUMALOG  KWIKPEN INSULIN  SUBQ) Inject under the skin 7-20 units tid before meals    ipratropium/albuterol  sulfate (COMBIVENT INHL) Take 1 Puff by inhalation Four times a day as needed    lidocaine -prilocaine  (EMLA ) 2.5-2.5 % Cream Apply topically to Port-A-Cath site 30 minutes prior to excessive port.    lisinopriL (PRINIVIL) 20 mg Oral Tablet Take 1 Tablet (20 mg total) by mouth Daily    loratadine  (CLARITIN ) 10 mg Oral Tablet Take 1 Tablet (10 mg total) by mouth Once per day as needed    magnesium  chloride (SLOW-MAG) 64 mg Oral Tablet, Delayed Release (E.C.) Take 1 Tablet (64 mg total) by mouth Daily Slow mag 71.5 mg-119 mg days 2 tablets daily    MetFORMIN (GLUCOPHAGE) 1,000 mg Oral Tablet Take 1 Tablet (1,000 mg total) by mouth Every morning with breakfast    metoclopramide  HCl (REGLAN ) 5 mg/5 mL Oral Solution     metoprolol  succinate (TOPROL -XL) 50 mg Oral Tablet  Sustained Release 24 hr Take 1 Tablet (50 mg total) by mouth Daily    omeprazole (PRILOSEC) 40 mg Oral Capsule, Delayed Release(E.C.) Take 1 Capsule (40 mg total) by mouth Daily    ondansetron  (ZOFRAN  ODT) 8 mg Oral Tablet, Rapid Dissolve Take 1 Tablet (8 mg total) by mouth Every 8 hours as needed for Nausea/Vomiting    promethazine  HCl (PROMETHAZINE , BULK,) 100 % Powder     QUEtiapine  (SEROQUEL ) 50 mg Oral Tablet Take 1 Tablet (50 mg total) by mouth Every night for 30 days    rOPINIRole  (REQUIP ) 1 mg Oral Tablet Take 1 Tablet (1 mg total) by mouth Three times a day    traMADoL  (ULTRAM ) 50 mg Oral Tablet TAKE TWO TABLETS BY MOUTH EVERY EIGHT HOURS    traZODone  (DESYREL ) 100 mg Oral Tablet Take 1 Tablet (100 mg total) by mouth Every night       Allergies[1]      PHYSICAL EXAM:  BP 133/80 (Site: Left Arm, Patient Position: Sitting, Cuff Size: Adult)   Pulse 93   Temp 36.7  C (98 F) (Temporal)   Ht 1.499 m (4' 11)   Wt 76.5 kg (168 lb 9.6 oz)   SpO2 99%   BMI 34.05 kg/m        ECOG Status: (3) Capable of limited self-care, confined to bed or chair > 50% of waking hours   Physical Exam  GENERAL: Alert, cooperative, well developed, well nourished, no acute distress  HEENT: Normocephalic, normal oropharynx, moist mucous membranes  CHEST: Clear to auscultation bilaterally, no wheezes, rhonchi, or crackles  CARDIOVASCULAR: Normal heart rate and rhythm, S1 and S2 normal without murmurs  ABDOMEN: Soft, non-tender, non-distended, without organomegaly, normal bowel sounds  EXTREMITIES: No cyanosis or edema  NEUROLOGICAL: Cranial nerves grossly intact, moves all extremities without gross motor or sensory deficit          LABS:   Results  LABS  BNP: normal  Phosphorus: 3.3 (11/2023)  Phosphorus: 2.8 (12/2023)  Phosphorus: 3.1 (01/02/2024)  Phosphorus: 2.9 (01/03/2024)  Phosphorus: 2.3 (01/09/2024)  Phosphorus: 1.6 (01/23/2024)  Phosphorus: 1.6 (01/29/2024)  Iron  saturation: 110% (01/29/2024)  Iron : 349 g/dL  (88/87/7974)  Ferritin: 900 ng/mL (01/29/2024)  ALT: 176 U/L (12/31/2023)  AST: 163 U/L (12/31/2023)  ALT: 247 U/L (01/30/2024)  AST: 271 U/L (01/30/2024)    RADIOLOGY  Chest CT: No fluid overload, pneumonia present (12/31/2023)       ASSESSMENT:    ICD-10-CM    1. Hypophosphatemia  E83.39 PHOSPHORUS, RANDOM URINE      2. Elevated liver enzymes  R74.8 HEPATITIS A IGM ANTIBODY     HEPATITIS B SURFACE ANTIGEN     HEPATITIS B CORE IGM, AB     HEPATITIS C ANTIBODY SCREEN WITH REFLEX TO HCV PCR      3. Low back pain  M54.50 CBC/DIFF     MRI SPINE LUMBOSACRAL W/WO CONTRAST     HEPATIC FUNCTION PANEL     CANCELED: MRI SPINE LUMBOSACRAL W/WO CONTRAST      4. Bowel and bladder incontinence  R32 CBC/DIFF    R15.9 MRI SPINE LUMBOSACRAL W/WO CONTRAST     HEPATIC FUNCTION PANEL     CANCELED: MRI SPINE LUMBOSACRAL W/WO CONTRAST      5. Iron  deficiency anemia, unspecified iron  deficiency anemia type  D50.9 IRON  TRANSFERRIN AND TIBC     FERRITIN      6. Dehydration  E86.0 BLOOD CELL COUNT W/DIFF - [facility]     COMPREHENSIVE METABOLIC PANEL, NON-FASTING     MAGNESIUM            Assessment & Plan  New-onset urinary and fecal incontinence with low back pain, rule out cauda equina syndrome  New-onset urinary and fecal incontinence with low back pain, raising concern for cauda equina syndrome. Back pain has changed, with stabbing pain radiating to the buttocks. Incontinence started 2-3 weeks ago. Differential includes cauda equina syndrome, which is an emergency.  - Ordered MRI of lumbar spine to rule out cauda equina syndrome  - Prescribed Valium  2 mg for MRI, with an additional 2 mg if needed    Hypophosphatemia with ongoing phosphorus loss  Persistent hypophosphatemia with phosphorus levels decreasing despite supplementation. Possible renal loss or malabsorption.  - Ordered random urine test to assess phosphorus excretion  - Will review lab results to determine cause of phosphorus loss  - Uptodate reviewed for low phosphorus  levels.  - PTH ordered    Vitamin D  Deficiency  - on Ergocalciferol  50,000 IU weekly  - Level was 12.78 on 10/6/5  Elevated liver enzymes under evaluation  Liver enzymes elevated with ALT at 247 and AST at 271. Possible causes include recent iron  infusion or hepatitis. Previous labs showed improvement during hospitalization, but current levels are higher.  - Ordered repeat liver function tests  - Ordered hepatitis A, B, and C serologies  _ Sess Dr. 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 Symptoms include fatigue and weakness.  - Continue management of dehydration and malabsorption  ->40 minutes spent on Uptodate to look for possible ways to improve this and treatment  - Labs to into Vitamin B1, B3, B6, & vitamin A     Dehydration secondary to chronic vomiting and diarrhea  Dehydration persists despite fluid administration. Fluids do not seem to improve symptoms, possibly due to concurrent iron  infusion.  - Continue fluid administration  - Monitor hydration status    Peripheral neuropathy and muscle cramps  Peripheral neuropathy with numbness in hands and legs, and muscle cramps. Symptoms may be related to electrolyte imbalances or other underlying conditions.  - Continue to monitor symptoms and adjust treatment as needed    Return in about 3 weeks (around 02/20/2024).    Martha White was given the chance to ask questions, and these were answered to their satisfaction. The patient is welcome to call with any questions or concerns in the meantime.     On the day of the encounter, I spent a total of  79 minutes on this patient encounter including review of historical information, examination, documentation and post-visit activities.     This note was created using [employer] M*Modal Fluency Align mobile application via conversational audio. Consent  for audio recording was obtained by patient/family members prior to recording.      Randine Clara APRN, FNP-BC, AOCNP, 01/30/2024 , 13:12       This note was partially generated using MModal Fluency Direct system, and there may be some incorrect words, spellings, and punctuation that were not noted in checking the note before saving.   You can see your note(s) in [facility]. It is common for you to encounter certain medical terminology which may be unfamiliar to you. You might see results before your provider does so please give at least 2 business days for review. Please have this understanding, that NOT all abnormal results are significant. Our office will contact you for any urgent or emergent action if necessary. If you have any questions or concerns, feel free to send a MyChart message or call the office. Please call with any new or concerning symptoms.     CC:  Eleanor JONELLE Lee, FNP  [address]  [place] [address]                           [1]   Allergies  Allergen Reactions    Alcohol  Shortness of Breath     Etoh, not rubbing alcohol       Sulfa (Sulfonamides) Anaphylaxis    Ibuprofen     Influenza Virus Vaccines     Influenza Virus Vaccine, Specific Hives/ Urticaria    Penicillins Hives/ Urticaria    Pneumococcal Vaccine Hives/ Urticaria

## 2024-01-30 NOTE — Addendum Note (Signed)
 Addended by: SHERRILEE SORA on: 01/30/2024 01:35 PM     Modules accepted: Orders

## 2024-01-31 ENCOUNTER — Encounter (INDEPENDENT_AMBULATORY_CARE_PROVIDER_SITE_OTHER): Payer: Self-pay | Admitting: NURSE PRACTITIONER

## 2024-01-31 ENCOUNTER — Ambulatory Visit (INDEPENDENT_AMBULATORY_CARE_PROVIDER_SITE_OTHER): Payer: Self-pay | Admitting: NURSE PRACTITIONER

## 2024-01-31 DIAGNOSIS — M545 Low back pain, unspecified: Secondary | ICD-10-CM

## 2024-01-31 DIAGNOSIS — E559 Vitamin D deficiency, unspecified: Secondary | ICD-10-CM

## 2024-01-31 DIAGNOSIS — E213 Hyperparathyroidism, unspecified: Secondary | ICD-10-CM

## 2024-02-05 ENCOUNTER — Ambulatory Visit
Admission: RE | Admit: 2024-02-05 | Discharge: 2024-02-05 | Disposition: A | Discharge status: Home / Self Care | Payer: Self-pay | Source: Ambulatory Visit | Attending: NURSE PRACTITIONER | Admitting: NURSE PRACTITIONER

## 2024-02-05 ENCOUNTER — Other Ambulatory Visit (INDEPENDENT_AMBULATORY_CARE_PROVIDER_SITE_OTHER): Payer: Self-pay | Admitting: NURSE PRACTITIONER

## 2024-02-05 ENCOUNTER — Other Ambulatory Visit: Payer: Self-pay

## 2024-02-05 VITALS — BP 135/78 | HR 102 | Temp 97.8°F | Resp 18

## 2024-02-05 DIAGNOSIS — Z903 Acquired absence of stomach [part of]: Secondary | ICD-10-CM

## 2024-02-05 DIAGNOSIS — D509 Iron deficiency anemia, unspecified: Secondary | ICD-10-CM

## 2024-02-05 DIAGNOSIS — R159 Full incontinence of feces: Secondary | ICD-10-CM

## 2024-02-05 DIAGNOSIS — E559 Vitamin D deficiency, unspecified: Secondary | ICD-10-CM

## 2024-02-05 DIAGNOSIS — R748 Abnormal levels of other serum enzymes: Secondary | ICD-10-CM

## 2024-02-05 DIAGNOSIS — M545 Low back pain, unspecified: Secondary | ICD-10-CM

## 2024-02-05 DIAGNOSIS — E860 Dehydration: Secondary | ICD-10-CM

## 2024-02-05 LAB — COMPREHENSIVE METABOLIC PANEL, NON-FASTING
ALBUMIN/GLOBULIN RATIO: 1.3 (ref 0.8–1.4)
ALBUMIN: 3.6 g/dL (ref 3.5–5.7)
ALKALINE PHOSPHATASE: 117 U/L — ABNORMAL HIGH (ref 34–104)
ALT (SGPT): 204 U/L — ABNORMAL HIGH (ref 7–52)
ANION GAP: 6 mmol/L (ref 4–13)
AST (SGOT): 107 U/L — ABNORMAL HIGH (ref 13–39)
BILIRUBIN TOTAL: 0.3 mg/dL (ref 0.3–1.0)
BUN/CREA RATIO: 19 (ref 6–22)
BUN: 18 mg/dL (ref 7–25)
CALCIUM, CORRECTED: 8.8 mg/dL — ABNORMAL LOW (ref 8.9–10.8)
CALCIUM: 8.5 mg/dL — ABNORMAL LOW (ref 8.6–10.3)
CHLORIDE: 110 mmol/L — ABNORMAL HIGH (ref 98–107)
CO2 TOTAL: 21 mmol/L (ref 21–31)
CREATININE: 0.96 mg/dL (ref 0.60–1.30)
ESTIMATED GFR: 68 mL/min/1.73mˆ2 (ref >59)
GLOBULIN: 2.8 (ref 2.0–3.5)
GLUCOSE: 142 mg/dL — ABNORMAL HIGH (ref 74–109)
OSMOLALITY, CALCULATED: 278 mosm/kg (ref 270–290)
POTASSIUM: 3.8 mmol/L (ref 3.5–5.1)
PROTEIN TOTAL: 6.4 g/dL (ref 6.4–8.9)
SODIUM: 137 mmol/L (ref 136–145)

## 2024-02-05 LAB — CBC WITH DIFF
BASOPHIL #: 0 x10ˆ3/uL (ref 0.00–0.10)
BASOPHIL %: 1 % (ref 0–1)
EOSINOPHIL #: 0.2 x10ˆ3/uL (ref 0.00–0.50)
EOSINOPHIL %: 7 % (ref 1–7)
HCT: 33.2 % (ref 31.2–41.9)
HGB: 10.9 g/dL (ref 10.9–14.3)
LYMPHOCYTE #: 0.8 x10ˆ3/uL — ABNORMAL LOW (ref 1.10–3.10)
LYMPHOCYTE %: 23 % (ref 16–46)
MCH: 26.6 pg (ref 24.7–32.8)
MCHC: 32.9 g/dL (ref 32.3–35.6)
MCV: 81 fL (ref 75.5–95.3)
MONOCYTE #: 0.3 x10ˆ3/uL (ref 0.20–0.90)
MONOCYTE %: 9 % (ref 4–11)
MPV: 7.5 fL — ABNORMAL LOW (ref 7.9–10.8)
NEUTROPHIL #: 2.2 x10ˆ3/uL (ref 1.90–8.20)
NEUTROPHIL %: 60 % (ref 43–77)
PLATELETS: 186 x10ˆ3/uL (ref 140–440)
RBC: 4.1 x10ˆ6/uL (ref 3.63–4.92)
RDW: 25.5 % — ABNORMAL HIGH (ref 12.3–17.7)
WBC: 3.6 x10ˆ3/uL — ABNORMAL LOW (ref 3.8–11.8)

## 2024-02-05 LAB — PHOSPHORUS: PHOSPHORUS: 1.4 mg/dL — ABNORMAL LOW (ref 3.7–7.2)

## 2024-02-05 LAB — IRON TRANSFERRIN AND TIBC
IRON (TRANSFERRIN) SATURATION: 26 % (ref 15–50)
IRON: 90 ug/dL (ref 50–212)
TOTAL IRON BINDING CAPACITY: 342 ug/dL (ref 250–450)
TRANSFERRIN: 244 mg/dL (ref 203–362)
UIBC: 252 ug/dL (ref 130–375)

## 2024-02-05 LAB — BILIRUBIN, CONJUGATED (DIRECT): BILIRUBIN DIRECT: 0.1 md/dL (ref 0.03–0.18)

## 2024-02-05 LAB — SCAN DIFFERENTIAL
PLATELET MORPHOLOGY COMMENT: NORMAL
SCHISTOCYTES: ABSENT

## 2024-02-05 LAB — FERRITIN: FERRITIN: 530 ng/mL — ABNORMAL HIGH (ref 11–307)

## 2024-02-05 LAB — MAGNESIUM: MAGNESIUM: 1.8 mg/dL — ABNORMAL LOW (ref 1.9–2.7)

## 2024-02-05 LAB — VITAMIN D 25 TOTAL: VITAMIN D 25, TOTAL: 11.84 ng/mL — ABNORMAL LOW (ref 30.00–100.00)

## 2024-02-05 LAB — PARATHYROID HORMONE (PTH): PTH: 269.4 pg/mL — ABNORMAL HIGH (ref 12.0–88.0)

## 2024-02-05 LAB — PHOSPHORUS, RANDOM URINE: PHOSPHORUS RANDOM URINE: 30.6 mg/dL (ref 20.0–60.0)

## 2024-02-05 MED ORDER — SODIUM CHLORIDE 0.9 % INTRAVENOUS SOLUTION
20.0000 mmol | Freq: Once | INTRAVENOUS | Status: AC
Start: 2024-02-05 — End: 2024-02-05
  Administered 2024-02-05: 0 mmol via INTRAVENOUS
  Administered 2024-02-05: 20 mmol via INTRAVENOUS
  Filled 2024-02-05: qty 6.67

## 2024-02-05 MED ORDER — CHOLECALCIFEROL (VITAMIN D3) 125 MCG (5,000 UNIT) CAPSULE
10000.0000 [IU] | ORAL_CAPSULE | Freq: Every day | ORAL | Status: AC
Start: 2024-02-05 — End: ?

## 2024-02-05 MED ORDER — SODIUM CHLORIDE 0.9 % IV BOLUS
1000.0000 mL | INJECTION | Freq: Once | Status: AC
Start: 2024-02-05 — End: 2024-02-05
  Administered 2024-02-05: 0 mL via INTRAVENOUS
  Administered 2024-02-05: 1000 mL via INTRAVENOUS

## 2024-02-05 NOTE — Result Encounter Note (Signed)
 The PTH was drawn before her Potassium Phosphorus infusion and I confirmed this.]    Randine Clara APRN, FNP-BC, AOCNP, 02/05/2024 , 15:28

## 2024-02-05 NOTE — Result Encounter Note (Signed)
 Called and spoke with patient about her labs available.  Her PTH is elevated at 269.4. Need to see endocrinology.  I put in referral to endocrinology here and then called patient she however sees Dr. Rozetta in [place]. I called his office to get her an appt.  Her next appt is in march 2026.  They had a cancellation for 02/10/24 at 8:30 am.  I accepted this time and checked with patient to see if this works.  She states that she will go to see him on that day and time.  I will fax the abnormal labs to [phone].  Per scheduling for that office.     Vitamin D  has not improved on the once weekly Vitamin D  I have changed to Cholecalciferol  10,000 IU daily to see if this will help to improve the number. Patient stated understanding of the information.     Randine Clara APRN, FNP-BC, AOCNP, 02/05/2024 , 15:22

## 2024-02-05 NOTE — Nurses Notes (Signed)
 Summary-Patient arrived and received NS bolus ,labs, phosphorus via port a cath. Patient brought urine sample from home and it was sent to lab. IVAR Clara aware of all lab results. Patient tolerated infusions well. PAC flushed with 20 ml ns post infusion and port deaccessed. Site unremarkable. Patient ambulated out with spouse.Lura Budge, LPN

## 2024-02-05 NOTE — Addendum Note (Signed)
 Addended by: SHERRILEE SORA on: 02/05/2024 03:22 PM     Modules accepted: Orders

## 2024-02-06 ENCOUNTER — Ambulatory Visit (INDEPENDENT_AMBULATORY_CARE_PROVIDER_SITE_OTHER)

## 2024-02-06 ENCOUNTER — Encounter (INDEPENDENT_AMBULATORY_CARE_PROVIDER_SITE_OTHER): Payer: Self-pay

## 2024-02-06 ENCOUNTER — Other Ambulatory Visit (INDEPENDENT_AMBULATORY_CARE_PROVIDER_SITE_OTHER): Payer: Self-pay | Admitting: NURSE PRACTITIONER

## 2024-02-06 VITALS — BP 124/74 | HR 101 | Resp 16 | Ht 59.0 in | Wt 160.0 lb

## 2024-02-06 DIAGNOSIS — M4316 Spondylolisthesis, lumbar region: Secondary | ICD-10-CM

## 2024-02-06 DIAGNOSIS — M48061 Spinal stenosis, lumbar region without neurogenic claudication: Secondary | ICD-10-CM

## 2024-02-06 DIAGNOSIS — E860 Dehydration: Secondary | ICD-10-CM

## 2024-02-06 DIAGNOSIS — G479 Sleep disorder, unspecified: Secondary | ICD-10-CM

## 2024-02-06 DIAGNOSIS — M545 Low back pain, unspecified: Secondary | ICD-10-CM

## 2024-02-06 DIAGNOSIS — R4589 Other symptoms and signs involving emotional state: Secondary | ICD-10-CM

## 2024-02-06 DIAGNOSIS — G8929 Other chronic pain: Secondary | ICD-10-CM

## 2024-02-06 LAB — HEPATITIS C ANTIBODY SCREEN WITH REFLEX TO HCV PCR: HCV ANTIBODY QUALITATIVE: NEGATIVE

## 2024-02-06 LAB — HEPATITIS B CORE IGM, AB: HBV CORE IGM ANTIBODY QUALITATIVE: NEGATIVE

## 2024-02-06 LAB — HEPATITIS A (HAV) IGM ANTIBODY: HAV IGM: NEGATIVE

## 2024-02-06 LAB — HEPATITIS B SURFACE ANTIGEN: HBV SURFACE ANTIGEN QUALITATIVE: NEGATIVE
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
## 2024-02-06 NOTE — Nursing Note (Signed)
 Patient comes into office today with c/o back pain upper and lower as well as neck pain rated 10/10. Patient described pain as a stabbing,shooting and is experiencing numbness. Patient stated that the pain radiates down both legs and goes into her hands and feet as well as her neck.    Isaiah Massing, LPN  [id number] 13:37

## 2024-02-07 LAB — VITAMIN A, SERUM: VITAMIN A (RETINOL): 17 ug/dL — ABNORMAL LOW (ref 38–98)

## 2024-02-08 LAB — NIACIN (VITAMIN B3), PLASMA
NICOTINAMIDE: <20 ng/mL
NICOTINIC ACID: <20 ng/mL

## 2024-02-08 LAB — VITAMIN B2 (RIBOFLAVIN), PLASMA: VITAMIN B2, PLASMA: 13.8 nmol/L (ref 6.2–39.0)

## 2024-02-08 LAB — VITAMIN B6 (PYRIDOXAL 5-PHOSPHATE), PLASMA: VITAMIN B6, PLASMA: 4.8 ng/mL (ref 2.1–21.7)

## 2024-02-10 LAB — VITAMIN B1 (THIAMIN), WHOLE BLOOD: VITAMIN B1 (THIAMINE), BLOOD, LC/MS/MS: 119 nmol/L (ref 78–185)
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
## 2024-02-12 ENCOUNTER — Other Ambulatory Visit (INDEPENDENT_AMBULATORY_CARE_PROVIDER_SITE_OTHER): Payer: Self-pay | Admitting: NURSE PRACTITIONER

## 2024-02-12 ENCOUNTER — Other Ambulatory Visit: Payer: Self-pay

## 2024-02-12 ENCOUNTER — Ambulatory Visit
Admission: RE | Admit: 2024-02-12 | Discharge: 2024-02-12 | Disposition: A | Discharge status: Home / Self Care | Payer: Self-pay | Source: Ambulatory Visit | Attending: NURSE PRACTITIONER | Admitting: NURSE PRACTITIONER

## 2024-02-12 VITALS — BP 143/90 | HR 100 | Temp 97.9°F | Resp 20

## 2024-02-12 DIAGNOSIS — E860 Dehydration: Secondary | ICD-10-CM | POA: Insufficient documentation

## 2024-02-12 LAB — COMPREHENSIVE METABOLIC PANEL, NON-FASTING
ALBUMIN/GLOBULIN RATIO: 1.3 (ref 0.8–1.4)
ALBUMIN: 3.8 g/dL (ref 3.5–5.7)
ALKALINE PHOSPHATASE: 125 U/L — ABNORMAL HIGH (ref 34–104)
ALT (SGPT): 176 U/L — ABNORMAL HIGH (ref 7–52)
ANION GAP: 3 mmol/L — ABNORMAL LOW (ref 4–13)
AST (SGOT): 56 U/L — ABNORMAL HIGH (ref 13–39)
BILIRUBIN TOTAL: 0.3 mg/dL (ref 0.3–1.0)
BUN/CREA RATIO: 16 (ref 6–22)
BUN: 16 mg/dL (ref 7–25)
CALCIUM, CORRECTED: 9.2 mg/dL (ref 8.9–10.8)
CALCIUM: 9 mg/dL (ref 8.6–10.3)
CHLORIDE: 109 mmol/L — ABNORMAL HIGH (ref 98–107)
CO2 TOTAL: 26 mmol/L (ref 21–31)
CREATININE: 0.99 mg/dL (ref 0.60–1.30)
ESTIMATED GFR: 65 mL/min/1.73mˆ2 (ref >59)
GLOBULIN: 3 (ref 2.0–3.5)
GLUCOSE: 83 mg/dL (ref 74–109)
OSMOLALITY, CALCULATED: 276 mosm/kg (ref 270–290)
POTASSIUM: 4.2 mmol/L (ref 3.5–5.1)
PROTEIN TOTAL: 6.8 g/dL (ref 6.4–8.9)
SODIUM: 138 mmol/L (ref 136–145)

## 2024-02-12 LAB — CBC WITH DIFF
BASOPHIL #: 0 x10ˆ3/uL (ref 0.00–0.10)
BASOPHIL %: 1 % (ref 0–1)
EOSINOPHIL #: 0.2 x10ˆ3/uL (ref 0.00–0.50)
EOSINOPHIL %: 7 % (ref 1–7)
HCT: 34.9 % (ref 31.2–41.9)
HGB: 11.6 g/dL (ref 10.9–14.3)
LYMPHOCYTE #: 0.8 x10ˆ3/uL — ABNORMAL LOW (ref 1.10–3.10)
LYMPHOCYTE %: 25 % (ref 16–46)
MCH: 27.2 pg (ref 24.7–32.8)
MCHC: 33.4 g/dL (ref 32.3–35.6)
MCV: 81.5 fL (ref 75.5–95.3)
MONOCYTE #: 0.4 x10ˆ3/uL (ref 0.20–0.90)
MONOCYTE %: 11 % (ref 4–11)
MPV: 7.4 fL — ABNORMAL LOW (ref 7.9–10.8)
NEUTROPHIL #: 1.9 x10ˆ3/uL (ref 1.90–8.20)
NEUTROPHIL %: 56 % (ref 43–77)
PLATELETS: 199 x10ˆ3/uL (ref 140–440)
RBC: 4.28 x10ˆ6/uL (ref 3.63–4.92)
RDW: 25.7 % — ABNORMAL HIGH (ref 12.3–17.7)
WBC: 3.4 x10ˆ3/uL — ABNORMAL LOW (ref 3.8–11.8)

## 2024-02-12 LAB — SCAN DIFFERENTIAL
PLATELET MORPHOLOGY COMMENT: NORMAL
RBC MORPHOLOGY COMMENT: NORMAL
SCHISTOCYTES: ABSENT

## 2024-02-12 LAB — MAGNESIUM: MAGNESIUM: 1.9 mg/dL (ref 1.9–2.7)

## 2024-02-12 LAB — PHOSPHORUS: PHOSPHORUS: 2.7 mg/dL — ABNORMAL LOW (ref 3.7–7.2)

## 2024-02-12 MED ORDER — SODIUM CHLORIDE 0.9 % INTRAVENOUS SOLUTION
20.0000 mmol | Freq: Once | INTRAVENOUS | Status: AC
Start: 2024-02-12 — End: 2024-02-12
  Administered 2024-02-12: 20 mmol via INTRAVENOUS
  Administered 2024-02-12: 0 mmol via INTRAVENOUS
  Filled 2024-02-12: qty 6.67

## 2024-02-12 MED ORDER — SODIUM CHLORIDE 0.9 % IV BOLUS
1000.0000 mL | INJECTION | Freq: Once | Status: AC
Start: 2024-02-12 — End: 2024-02-12
  Administered 2024-02-12: 1000 mL via INTRAVENOUS
  Administered 2024-02-12: 0 mL via INTRAVENOUS

## 2024-02-12 NOTE — Nurses Notes (Signed)
 Summary-Patient arrived and received NS bolus ,labs, phosphorus via port a cath. Patient tolerated infusions well. PAC flushed with 20 ml ns post infusion and port deaccessed. Site unremarkable. Patient ambulated out with spouse.Marshall Shropshire, LPN

## 2024-02-14 ENCOUNTER — Other Ambulatory Visit (INDEPENDENT_AMBULATORY_CARE_PROVIDER_SITE_OTHER): Payer: Self-pay | Admitting: NURSE PRACTITIONER

## 2024-02-14 DIAGNOSIS — E860 Dehydration: Secondary | ICD-10-CM

## 2024-02-18 LAB — ARUP MISC INT TEST 2-REFRIG

## 2024-02-19 ENCOUNTER — Other Ambulatory Visit (INDEPENDENT_AMBULATORY_CARE_PROVIDER_SITE_OTHER): Payer: Self-pay | Admitting: NURSE PRACTITIONER

## 2024-02-19 ENCOUNTER — Ambulatory Visit (INDEPENDENT_AMBULATORY_CARE_PROVIDER_SITE_OTHER): Payer: Self-pay | Admitting: NURSE PRACTITIONER

## 2024-02-19 ENCOUNTER — Ambulatory Visit
Admission: RE | Admit: 2024-02-19 | Discharge: 2024-02-19 | Disposition: A | Payer: Self-pay | Source: Ambulatory Visit | Attending: NURSE PRACTITIONER

## 2024-02-19 ENCOUNTER — Telehealth (INDEPENDENT_AMBULATORY_CARE_PROVIDER_SITE_OTHER): Payer: Self-pay | Admitting: NURSE PRACTITIONER

## 2024-02-19 ENCOUNTER — Other Ambulatory Visit: Payer: Self-pay

## 2024-02-19 VITALS — BP 118/72 | HR 89 | Temp 98.3°F | Resp 16

## 2024-02-19 DIAGNOSIS — E860 Dehydration: Secondary | ICD-10-CM | POA: Insufficient documentation

## 2024-02-19 LAB — COMPREHENSIVE METABOLIC PANEL, NON-FASTING
ALBUMIN/GLOBULIN RATIO: 1.3 (ref 0.8–1.4)
ALBUMIN: 3.9 g/dL (ref 3.5–5.7)
ALKALINE PHOSPHATASE: 127 U/L — ABNORMAL HIGH (ref 34–104)
ALT (SGPT): 200 U/L — ABNORMAL HIGH (ref 7–52)
ANION GAP: 5 mmol/L (ref 4–13)
AST (SGOT): 110 U/L — ABNORMAL HIGH (ref 13–39)
BILIRUBIN TOTAL: 0.3 mg/dL (ref 0.3–1.0)
BUN/CREA RATIO: 13 (ref 6–22)
BUN: 13 mg/dL (ref 7–25)
CALCIUM, CORRECTED: 9 mg/dL (ref 8.9–10.8)
CALCIUM: 8.9 mg/dL (ref 8.6–10.3)
CHLORIDE: 110 mmol/L — ABNORMAL HIGH (ref 98–107)
CO2 TOTAL: 24 mmol/L (ref 21–31)
CREATININE: 0.99 mg/dL (ref 0.60–1.30)
ESTIMATED GFR: 65 mL/min/1.73mˆ2 (ref >59)
GLOBULIN: 2.9 (ref 2.0–3.5)
GLUCOSE: 80 mg/dL (ref 74–109)
OSMOLALITY, CALCULATED: 277 mosm/kg (ref 270–290)
POTASSIUM: 3.9 mmol/L (ref 3.5–5.1)
PROTEIN TOTAL: 6.8 g/dL (ref 6.4–8.9)
SODIUM: 139 mmol/L (ref 136–145)

## 2024-02-19 LAB — CBC WITH DIFF
BASOPHIL #: 0.1 x10ˆ3/uL (ref 0.00–0.10)
BASOPHIL %: 1 % (ref 0–1)
EOSINOPHIL #: 0.2 x10ˆ3/uL (ref 0.00–0.50)
EOSINOPHIL %: 5 % (ref 1–7)
HCT: 34.1 % (ref 31.2–41.9)
HGB: 11.4 g/dL (ref 10.9–14.3)
LYMPHOCYTE #: 0.8 x10ˆ3/uL — ABNORMAL LOW (ref 1.10–3.10)
LYMPHOCYTE %: 21 % (ref 16–46)
MCH: 27.6 pg (ref 24.7–32.8)
MCHC: 33.5 g/dL (ref 32.3–35.6)
MCV: 82.3 fL (ref 75.5–95.3)
MONOCYTE #: 0.5 x10ˆ3/uL (ref 0.20–0.90)
MONOCYTE %: 13 % — ABNORMAL HIGH (ref 4–11)
MPV: 7.3 fL — ABNORMAL LOW (ref 7.9–10.8)
NEUTROPHIL #: 2.4 x10ˆ3/uL (ref 1.90–8.20)
NEUTROPHIL %: 61 % (ref 43–77)
PLATELETS: 188 x10ˆ3/uL (ref 140–440)
RBC: 4.14 x10ˆ6/uL (ref 3.63–4.92)
RDW: 25 % — ABNORMAL HIGH (ref 12.3–17.7)
WBC: 4 x10ˆ3/uL (ref 3.8–11.8)

## 2024-02-19 LAB — SCAN DIFFERENTIAL
PLATELET MORPHOLOGY COMMENT: NORMAL
RBC MORPHOLOGY COMMENT: NORMAL
SCHISTOCYTES: ABSENT

## 2024-02-19 LAB — PHOSPHORUS: PHOSPHORUS: 2.8 mg/dL — ABNORMAL LOW (ref 3.7–7.2)

## 2024-02-19 LAB — MAGNESIUM: MAGNESIUM: 1.8 mg/dL — ABNORMAL LOW (ref 1.9–2.7)

## 2024-02-19 MED ORDER — PROCHLORPERAZINE EDISYLATE 10 MG/2 ML (5 MG/ML) INJECTION SOLUTION
10.0000 mg | Freq: Four times a day (QID) | INTRAMUSCULAR | Status: AC | PRN
  Administered 2024-02-19: 10 mg via INTRAVENOUS
  Filled 2024-02-19: qty 2

## 2024-02-19 MED ORDER — SODIUM CHLORIDE 0.9 % INTRAVENOUS SOLUTION
20.0000 mmol | Freq: Once | INTRAVENOUS | Status: AC
  Administered 2024-02-19: 20 mmol via INTRAVENOUS
  Administered 2024-02-19: 0 mmol via INTRAVENOUS
  Filled 2024-02-19: qty 6.67

## 2024-02-19 MED ORDER — SODIUM CHLORIDE 0.9 % IV BOLUS
1000.0000 mL | INJECTION | Freq: Once | Status: AC
  Administered 2024-02-19: 0 mL via INTRAVENOUS
  Administered 2024-02-19: 1000 mL via INTRAVENOUS

## 2024-02-19 NOTE — Telephone Encounter (Signed)
 I called Nutrition at [facility] on 02/18/24.  Call was returned on 02/19/24  by Delwin Agent.  We discussed patient nutrition status and gastrectomy issues.  She recommends a J tube for patient to get adequate nutrition.       Randine Clara APRN, FNP-BC, AOCNP, 02/19/2024 , 08:42

## 2024-02-19 NOTE — Nurses Notes (Signed)
 0816-Arrived to OP ONC ambulatory.  Here for labs, IV fluids and possible potassium phosphate  infusion.  To room 221. Alan Borrow, RN  0830-Right upper chest PortACath accessed per protocol using sterile technique.  Excellent blood return and flush noted.  10ml blood wasted and then labs obtained from port.  Flushed with additional 20ml NS.  Transparent, bordered dressing applied and tubing secured.  Tolerated well.  Labs sent to main hospital lab through tube system. Alan Borrow, RN  0835-NS 1,020ml bolus IV infusion started. Alan Borrow, RN  [phone] completed.  Sitting up in vascular chair with wheels locked and call light within reach.  Awake, alert, and oriented x 3.  Does verbalize complaints of chronic, mid, back pain rated 10/10.  Does take ultram  100 mg PO q8h with little to no relief.  Per pt, one of her doctor's has submitted a referral to get pt into a pain clinic.  Also verbalizes c/o pain in right upper shoulder from prior surgical procedure.  No other voiced complaints offered.  HRR.  Lungs clear.  Abdomen soft and non-tender with active bowel sounds noted in all four quadrants.  Non-pitting edema noted to BLL below the knee.  VSS with no acute distress at this time. Alan Borrow, RN  0926-Phosphorus level 2.8.  Potassium phosphate  order released. Pt updated. Sady Monaco, RN  0935-NS 1,000 ml bolus complete. Alan Borrow, RN  1019-Potassium phosphate  in NS IVPB started. Alan Borrow, RN  1200-Resting quietly with no voiced complaints offered. Alan Borrow, RN  1248-Verbalized c/o nausea.  Randine Clara, APRN, CNP notified via secure chat.  New orders given to administer Compazine  10 mg IVP x 1 dose.  New orders carried out.  Sugar Vanzandt, RN  1301-Compazine  10 mg IVP administered at this time. Alan Borrow, RN  1308-Resting with eyes closed.  Aroused to voice but drowsy.  VSS. Alan Borrow, RN  1340-Spouse in room and brought pt food.  Pt eating at this time. Alan Borrow,  RN  1419-Potassium phosphate  in NS infusion complete.  Alan Borrow, RN  1435-Left OP ONC ambulatory with spouse.  VSS with no acute distress noted. Alan Borrow, RN

## 2024-02-20 ENCOUNTER — Other Ambulatory Visit (INDEPENDENT_AMBULATORY_CARE_PROVIDER_SITE_OTHER): Payer: Self-pay | Admitting: NURSE PRACTITIONER

## 2024-02-20 DIAGNOSIS — E860 Dehydration: Secondary | ICD-10-CM

## 2024-02-24 ENCOUNTER — Ambulatory Visit (HOSPITAL_COMMUNITY): Payer: Self-pay

## 2024-02-26 ENCOUNTER — Ambulatory Visit
Admission: RE | Admit: 2024-02-26 | Discharge: 2024-02-26 | Disposition: A | Source: Ambulatory Visit | Attending: NURSE PRACTITIONER

## 2024-02-26 ENCOUNTER — Other Ambulatory Visit: Payer: Self-pay

## 2024-02-26 ENCOUNTER — Other Ambulatory Visit (INDEPENDENT_AMBULATORY_CARE_PROVIDER_SITE_OTHER): Payer: Self-pay | Admitting: NURSE PRACTITIONER

## 2024-02-26 VITALS — BP 129/84 | HR 87 | Temp 97.4°F | Resp 20

## 2024-02-26 DIAGNOSIS — E860 Dehydration: Secondary | ICD-10-CM

## 2024-02-26 LAB — CBC WITH DIFF
BASOPHIL #: 0 x10ˆ3/uL (ref 0.00–0.10)
BASOPHIL %: 1 % (ref 0–1)
EOSINOPHIL #: 0.1 x10ˆ3/uL (ref 0.00–0.50)
EOSINOPHIL %: 4 % (ref 1–7)
HCT: 35.4 % (ref 31.2–41.9)
HGB: 11.7 g/dL (ref 10.9–14.3)
LYMPHOCYTE #: 0.9 x10ˆ3/uL — ABNORMAL LOW (ref 1.10–3.10)
LYMPHOCYTE %: 27 % (ref 16–46)
MCH: 27.3 pg (ref 24.7–32.8)
MCHC: 33.1 g/dL (ref 32.3–35.6)
MCV: 82.4 fL (ref 75.5–95.3)
MONOCYTE #: 0.4 x10ˆ3/uL (ref 0.20–0.90)
MONOCYTE %: 11 % (ref 4–11)
MPV: 7.4 fL — ABNORMAL LOW (ref 7.9–10.8)
NEUTROPHIL #: 1.8 x10ˆ3/uL — ABNORMAL LOW (ref 1.90–8.20)
NEUTROPHIL %: 56 % (ref 43–77)
PLATELETS: 193 x10ˆ3/uL (ref 140–440)
RBC: 4.29 x10ˆ6/uL (ref 3.63–4.92)
RDW: 23.7 % — ABNORMAL HIGH (ref 12.3–17.7)
WBC: 3.3 x10ˆ3/uL — ABNORMAL LOW (ref 3.8–11.8)

## 2024-02-26 LAB — COMPREHENSIVE METABOLIC PANEL, NON-FASTING
ALBUMIN/GLOBULIN RATIO: 1.2 (ref 0.8–1.4)
ALBUMIN: 3.8 g/dL (ref 3.5–5.7)
ALKALINE PHOSPHATASE: 114 U/L — ABNORMAL HIGH (ref 34–104)
ALT (SGPT): 103 U/L — ABNORMAL HIGH (ref 7–52)
ANION GAP: 6 mmol/L (ref 4–13)
AST (SGOT): 32 U/L (ref 13–39)
BILIRUBIN TOTAL: 0.4 mg/dL (ref 0.3–1.0)
BUN/CREA RATIO: 9 (ref 6–22)
BUN: 9 mg/dL (ref 7–25)
CALCIUM, CORRECTED: 9.2 mg/dL (ref 8.9–10.8)
CALCIUM: 9 mg/dL (ref 8.6–10.3)
CHLORIDE: 108 mmol/L — ABNORMAL HIGH (ref 98–107)
CO2 TOTAL: 28 mmol/L (ref 21–31)
CREATININE: 0.95 mg/dL (ref 0.60–1.30)
ESTIMATED GFR: 69 mL/min/1.73mˆ2 (ref >59)
GLOBULIN: 3.1 (ref 2.0–3.5)
GLUCOSE: 90 mg/dL (ref 74–109)
OSMOLALITY, CALCULATED: 281 mosm/kg (ref 270–290)
POTASSIUM: 4 mmol/L (ref 3.5–5.1)
PROTEIN TOTAL: 6.9 g/dL (ref 6.4–8.9)
SODIUM: 142 mmol/L (ref 136–145)

## 2024-02-26 LAB — PHOSPHORUS: PHOSPHORUS: 2.9 mg/dL — ABNORMAL LOW (ref 3.7–7.2)

## 2024-02-26 LAB — SCAN DIFFERENTIAL
PLATELET MORPHOLOGY COMMENT: NORMAL
RBC MORPHOLOGY COMMENT: NORMAL
SCHISTOCYTES: ABSENT

## 2024-02-26 LAB — MAGNESIUM: MAGNESIUM: 1.8 mg/dL — ABNORMAL LOW (ref 1.9–2.7)

## 2024-02-26 MED ORDER — SODIUM CHLORIDE 0.9 % INTRAVENOUS SOLUTION
20.0000 mmol | Freq: Once | INTRAVENOUS | Status: AC
  Administered 2024-02-26: 20 mmol via INTRAVENOUS
  Administered 2024-02-26: 0 mmol via INTRAVENOUS
  Filled 2024-02-26: qty 6.67

## 2024-02-26 MED ORDER — SODIUM CHLORIDE 0.9 % IV BOLUS
1000.0000 mL | INJECTION | Freq: Once | Status: AC
  Administered 2024-02-26: 0 mL via INTRAVENOUS
  Administered 2024-02-26: 1000 mL via INTRAVENOUS

## 2024-02-26 NOTE — Nurses Notes (Signed)
 Summary-Patient arrived and received NS bolus ,labs, phosphorus via port a cath. Patient tolerated infusions well. PAC flushed with 20 ml ns post infusion and port deaccessed. Site unremarkable. Patient ambulated out with spouse.Marshall Shropshire, LPN

## 2024-02-27 ENCOUNTER — Encounter (INDEPENDENT_AMBULATORY_CARE_PROVIDER_SITE_OTHER): Payer: Self-pay | Admitting: HEMATOLOGY-ONCOLOGY

## 2024-02-27 ENCOUNTER — Other Ambulatory Visit (INDEPENDENT_AMBULATORY_CARE_PROVIDER_SITE_OTHER): Payer: Self-pay | Admitting: NURSE PRACTITIONER

## 2024-02-27 DIAGNOSIS — E860 Dehydration: Secondary | ICD-10-CM

## 2024-02-27 NOTE — Nursing Note (Signed)
 Pt called and lvm needing to know when next appt was going to be.   Called pt back to discuss, appt is next week on 03/04/24 at 0930

## 2024-03-03 ENCOUNTER — Other Ambulatory Visit (INDEPENDENT_AMBULATORY_CARE_PROVIDER_SITE_OTHER): Payer: Self-pay | Admitting: NURSE PRACTITIONER

## 2024-03-04 ENCOUNTER — Encounter (INDEPENDENT_AMBULATORY_CARE_PROVIDER_SITE_OTHER): Payer: Self-pay | Admitting: NURSE PRACTITIONER

## 2024-03-04 ENCOUNTER — Ambulatory Visit (HOSPITAL_COMMUNITY)
Admission: RE | Admit: 2024-03-04 | Discharge: 2024-03-04 | Disposition: A | Source: Ambulatory Visit | Attending: NURSE PRACTITIONER

## 2024-03-04 ENCOUNTER — Other Ambulatory Visit: Payer: Self-pay

## 2024-03-04 ENCOUNTER — Other Ambulatory Visit (INDEPENDENT_AMBULATORY_CARE_PROVIDER_SITE_OTHER): Payer: Self-pay | Admitting: NURSE PRACTITIONER

## 2024-03-04 ENCOUNTER — Ambulatory Visit (INDEPENDENT_AMBULATORY_CARE_PROVIDER_SITE_OTHER): Admission: RE | Admit: 2024-03-04 | Discharge: 2024-03-04 | Payer: Self-pay | Attending: NURSE PRACTITIONER

## 2024-03-04 ENCOUNTER — Encounter (HOSPITAL_COMMUNITY): Payer: Self-pay

## 2024-03-04 ENCOUNTER — Ambulatory Visit (INDEPENDENT_AMBULATORY_CARE_PROVIDER_SITE_OTHER): Payer: Self-pay | Admitting: NURSE PRACTITIONER

## 2024-03-04 ENCOUNTER — Ambulatory Visit
Admission: RE | Admit: 2024-03-04 | Discharge: 2024-03-04 | Disposition: A | Discharge status: Home / Self Care | Payer: Self-pay | Source: Ambulatory Visit | Attending: NURSE PRACTITIONER | Admitting: NURSE PRACTITIONER

## 2024-03-04 VITALS — BP 125/77 | HR 86 | Temp 97.1°F | Resp 20 | Ht 59.5 in | Wt 174.0 lb

## 2024-03-04 DIAGNOSIS — E041 Nontoxic single thyroid nodule: Secondary | ICD-10-CM | POA: Insufficient documentation

## 2024-03-04 DIAGNOSIS — Z903 Acquired absence of stomach [part of]: Secondary | ICD-10-CM | POA: Insufficient documentation

## 2024-03-04 DIAGNOSIS — B370 Candidal stomatitis: Secondary | ICD-10-CM | POA: Insufficient documentation

## 2024-03-04 DIAGNOSIS — D509 Iron deficiency anemia, unspecified: Secondary | ICD-10-CM | POA: Insufficient documentation

## 2024-03-04 DIAGNOSIS — E43 Unspecified severe protein-calorie malnutrition: Secondary | ICD-10-CM | POA: Insufficient documentation

## 2024-03-04 DIAGNOSIS — R062 Wheezing: Secondary | ICD-10-CM

## 2024-03-04 DIAGNOSIS — E213 Hyperparathyroidism, unspecified: Secondary | ICD-10-CM | POA: Insufficient documentation

## 2024-03-04 DIAGNOSIS — E860 Dehydration: Secondary | ICD-10-CM

## 2024-03-04 DIAGNOSIS — Z6834 Body mass index (BMI) 34.0-34.9, adult: Secondary | ICD-10-CM | POA: Insufficient documentation

## 2024-03-04 DIAGNOSIS — M7989 Other specified soft tissue disorders: Secondary | ICD-10-CM | POA: Insufficient documentation

## 2024-03-04 DIAGNOSIS — R601 Generalized edema: Secondary | ICD-10-CM | POA: Insufficient documentation

## 2024-03-04 LAB — SCAN DIFFERENTIAL
PLATELET MORPHOLOGY COMMENT: NORMAL
RBC MORPHOLOGY COMMENT: NORMAL
SCHISTOCYTES: ABSENT

## 2024-03-04 LAB — CBC
HCT: 35.9 % (ref 31.2–41.9)
HGB: 11.8 g/dL (ref 10.9–14.3)
MCH: 27.4 pg (ref 24.7–32.8)
MCHC: 32.9 g/dL (ref 32.3–35.6)
MCV: 83.4 fL (ref 75.5–95.3)
MPV: 7.7 fL — ABNORMAL LOW (ref 7.9–10.8)
PLATELETS: 194 x10ˆ3/uL (ref 140–440)
RBC: 4.31 x10ˆ6/uL (ref 3.63–4.92)
RDW: 22.6 % — ABNORMAL HIGH (ref 12.3–17.7)
WBC: 3.7 x10ˆ3/uL — ABNORMAL LOW (ref 3.8–11.8)

## 2024-03-04 LAB — COMPREHENSIVE METABOLIC PANEL, NON-FASTING
ALBUMIN/GLOBULIN RATIO: 1.3 (ref 0.8–1.4)
ALBUMIN: 4 g/dL (ref 3.5–5.7)
ALKALINE PHOSPHATASE: 114 U/L — ABNORMAL HIGH (ref 34–104)
ALT (SGPT): 89 U/L — ABNORMAL HIGH (ref 7–52)
ANION GAP: 5 mmol/L (ref 4–13)
AST (SGOT): 65 U/L — ABNORMAL HIGH (ref 13–39)
BILIRUBIN TOTAL: 0.4 mg/dL (ref 0.3–1.0)
BUN/CREA RATIO: 19 (ref 6–22)
BUN: 19 mg/dL (ref 7–25)
CALCIUM, CORRECTED: 9.3 mg/dL (ref 8.9–10.8)
CALCIUM: 9.3 mg/dL (ref 8.6–10.3)
CHLORIDE: 106 mmol/L (ref 98–107)
CO2 TOTAL: 29 mmol/L (ref 21–31)
CREATININE: 1.01 mg/dL (ref 0.60–1.30)
ESTIMATED GFR: 64 mL/min/1.73mˆ2 (ref >59)
GLOBULIN: 3 (ref 2.0–3.5)
GLUCOSE: 83 mg/dL (ref 74–109)
OSMOLALITY, CALCULATED: 281 mosm/kg (ref 270–290)
POTASSIUM: 4.3 mmol/L (ref 3.5–5.1)
PROTEIN TOTAL: 7 g/dL (ref 6.4–8.9)
SODIUM: 140 mmol/L (ref 136–145)

## 2024-03-04 LAB — PHOSPHORUS: PHOSPHORUS: 3.5 mg/dL — ABNORMAL LOW (ref 3.7–7.2)

## 2024-03-04 LAB — FERRITIN: FERRITIN: 409 ng/mL — ABNORMAL HIGH (ref 11–307)

## 2024-03-04 LAB — PARATHYROID HORMONE (PTH): PTH: 289.4 pg/mL — ABNORMAL HIGH (ref 12.0–88.0)

## 2024-03-04 LAB — IRON TRANSFERRIN AND TIBC
IRON (TRANSFERRIN) SATURATION: 30 % (ref 15–50)
IRON: 97 ug/dL (ref 50–212)
TOTAL IRON BINDING CAPACITY: 321 ug/dL (ref 250–450)
TRANSFERRIN: 229 mg/dL (ref 203–362)
UIBC: 224 ug/dL (ref 130–375)

## 2024-03-04 LAB — MAGNESIUM: MAGNESIUM: 2 mg/dL (ref 1.9–2.7)

## 2024-03-04 MED ORDER — SODIUM CHLORIDE 0.9 % INTRAVENOUS SOLUTION
20.0000 mmol | Freq: Once | INTRAVENOUS | Status: AC
  Administered 2024-03-04: 17:00:00 0 mmol via INTRAVENOUS
  Administered 2024-03-04: 13:00:00 20 mmol via INTRAVENOUS
  Filled 2024-03-04: qty 6.67

## 2024-03-04 MED ORDER — NYSTATIN 100,000 UNIT/ML ORAL SUSPENSION: 5.0000 mL | Freq: Four times a day (QID) | ORAL | 0 refills | Status: AC

## 2024-03-04 MED ORDER — PROCHLORPERAZINE EDISYLATE 10 MG/2 ML (5 MG/ML) INJECTION SOLUTION
10.0000 mg | Freq: Once | INTRAMUSCULAR | Status: AC
  Administered 2024-03-04: 13:00:00 10 mg via INTRAVENOUS
  Filled 2024-03-04: qty 2

## 2024-03-04 NOTE — Nursing Note (Signed)
 Pt roomed, alert and oriented x3, ambulatory. Hsb is with her. Pt c/o pain to head, arms and stomach, rates 10/10, says pain meds only keep me from crying.  Fall risk, bracelet placed.  Accepted AD packet to review, discussed not to sign until in front of notary, voiced understanding.  Has mask on today, asked pt if she didn't feel well, states might be pneumonia, sinus infection.    Report given to Randine, NP.

## 2024-03-04 NOTE — Cancer Center Note (Signed)
 Martha White  [id number]  1965/07/23   03/04/2024       Department of Hematology/Oncology  Return Patient Visit           REFERRING PROVIDER:  Lennie Eleanor SAUNDERS, FNP  [address]  [address]  [place],  [place] [address]    PCP:  Eleanor SAUNDERS Lennie, FNP  [address]  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    Generalized edema and rapid weight gain  - Rapid 20-pound weight gain over the past month  - Marked generalized edema involving legs, hands, and arms  - Hands  swollen ; persistent swelling of legs  - Edema visible to others  - IV fluids provide some relief from nausea    Functional decline and fatigue  - Severe exhaustion with minimal exertion  - Profound functional decline, now requiring assistance with basic activities and caregiving from her son  - Significant fatigue  - Episode of severe pain and nausea last week, accompanied by sensation of depersonalization    Protein-calorie malnutrition and gastrointestinal symptoms  - Severe protein-calorie malnutrition following total gastrectomy  - Unable to maintain adequate oral nutrition  - Ongoing nausea, frequent vomiting, and rapid gastrointestinal transit  - Cannot tolerate large meals; requires continuous small feedings, which remain insufficient  - Postprandial hypoglycemia requiring food at bedside    Electrolyte disturbances  - Persistent hypophosphatemia and hypomagnesemia despite regular infusions    Iron  deficiency anemia  - Craves ice, which causes oral discomfort    Oral and oropharyngeal symptoms  - Ongoing dry, cracked lips with white oral secretions  - Sore throat  with dysphagia  - White-appearing tongue    Neurological and vascular symptoms  - Chronic headaches  - Persistent numbness and discoloration of hands  - Discoloration of hands, notably turning purple in the cold    Respiratory symptoms  - Intermittent wheezing and dyspnea, particularly in the mornings    Parathyroid  abnormalities  - History of parathyroid  nodules  - Recent ultrasound revealed three large nodules and left-sided enlargement  - Following with endocrinology    Sleep disturbance and anxiety  - Difficulty sleeping due to anxiety about not waking up    Hepatic abnormalities  - History of fluctuating liver enzymes          REVIEW OF SYSTEMS:    Past Medical History:   Diagnosis Date    Arthropathy     Asthma     Carpal tunnel syndrome     Carpal tunnel syndrome     Congestive heart failure     COPD (chronic obstructive pulmonary disease)     Depression     Diabetes mellitus, type 2     Fibromyalgia     Gastroparesis     Headache     HTN (hypertension)     Lumbar herniated disc     Neuropathy (CMS HCC)     Restless legs syndrome (RLS)     Thyroid  disease     TIA (transient ischemic attack)     TIA (transient ischemic attack)  Past Surgical History:   Procedure Laterality Date    HX CHOLECYSTECTOMY      HX HERNIA REPAIR      HX HERNIA REPAIR      HX HYSTERECTOMY      HX ROTATOR CUFF REPAIR      SHOULDER OPEN ROTATOR CUFF REPAIR Right     STOMACH SURGERY      X4           Social History     Socioeconomic History    Marital status: Married     Spouse name: Not on file    Number of children: Not on file    Years of education: Not on file    Highest education level: Not on file   Occupational History    Not on file   Tobacco Use    Smoking status: Never    Smokeless tobacco: Never   Vaping Use    Vaping status: Never Used   Substance and Sexual Activity    Alcohol  use: Never    Drug use: Never    Sexual activity: Never   Other Topics Concern    Not on file   Social History Narrative    Not on file      Social Determinants of Health     Financial Resource Strain: Not on file   Transportation Needs: Not on file   Social Connections: Low Risk (01/01/2024)    Social Connections     SDOH Social Isolation: 5 or more times a week   Recent Concern: Social Connections - Medium Risk (12/07/2023)    Social Connections     SDOH Social Isolation: 3 to 5 times a week   Intimate Partner Violence: Not on file   Housing Stability: Not on file       Social History     Social History Narrative    Not on file       Social History     Substance and Sexual Activity   Drug Use Never       Family Medical History:       Problem Relation (Age of Onset)    Alzheimer's/Dementia Maternal Grandmother    Heart Attack Maternal Grandfather    Hypertension (High Blood Pressure) Mother, Maternal Grandmother, Maternal Grandfather    Pancreatic Cancer Maternal Grandmother              Current Outpatient Medications   Medication Sig    albuterol  sulfate (PROVENTIL  OR VENTOLIN  OR PROAIR ) 90 mcg/actuation Inhalation oral inhaler Take 1-2 Puffs by inhalation Every 6 hours as needed    amLODIPine  (NORVASC ) 10 mg Oral Tablet Take 1 Tablet (10 mg total) by mouth Daily    aspirin  (ECOTRIN) 81 mg Oral Tablet, Delayed Release (E.C.) Take 1 Tablet (81 mg total) by mouth Daily (Patient not taking: Reported on 03/04/2024)    atorvastatin  calcium (ATORVASTATIN  ORAL) Take 80 mg by mouth Once a day    bethanechol chloride (URECHOLINE) 5 mg Oral Tablet bethanechol chloride 5 mg tablet   TAKE ONE TABLET BY MOUTH ONCE EVERY DAY    cholecalciferol , vitamin D3, 125 mcg (5,000 unit) Oral Capsule Take 2 Capsules (10,000 Units total) by mouth Daily    clindamycin phosphate (CLEOCIN) 2 % Vaginal Cream USE 1 APPLICATORFUL VAGINALLY AS DIRECTED NIGHTLY AT BEDTIME FOR 7 NIGHTS    clopidogreL  (PLAVIX ) 75 mg Oral Tablet Take 1 Tablet (75 mg total) by mouth Daily    cyanocobalamin (VITAMIN B12) 1,000 mcg/mL Injection Solution  Inject 1 mL (1,000 mcg total) under the skin Every  14 days    cyclobenzaprine  (FLEXERIL ) 5 mg Oral Tablet Take 1 Tablet (5 mg total) by mouth Twice daily    diazePAM  (VALIUM ) 2 mg Oral Tablet Take 1 Tablet (2 mg total) by mouth Per instructions For MRI.  May repeat x 1 if needed to complete MRI (Patient not taking: Reported on 03/04/2024)    divalproex  (DEPAKOTE  ER) 250 mg Oral Tablet Sustained Release 24 hr Take 1 Tablet (250 mg total) by mouth    docusate sodium (COLACE) 100 mg Oral Capsule 100 mg by oral route. (Patient not taking: Reported on 03/04/2024)    dulaglutide (TRULICITY) 0.75 mg/0.5 mL Subcutaneous Pen Injector Inject 0.5 mL (0.75 mg total) under the skin Every 7 days    DULoxetine  (CYMBALTA  DR) 30 mg Oral Capsule, Delayed Release(E.C.) Take 1 Capsule (30 mg total) by mouth Twice daily    famotidine  (PEPCID ) 20 mg Oral Tablet Take 1 Tablet (20 mg total) by mouth Daily    furosemide  (LASIX ) 20 mg Oral Tablet Take 1 Tablet (20 mg total) by mouth Once per day as needed    gabapentin  (NEURONTIN ) 400 mg Oral Capsule Take 1 Capsule (400 mg total) by mouth Twice daily (Patient not taking: Reported on 03/04/2024)    hydrOXYzine pamoate (VISTARIL) 25 mg Oral Capsule Take 1 Capsule (25 mg total) by mouth Every night    insulin  detemir U-100 (LEVEMIR FLEXPEN) 100 unit/mL (3 mL) Subcutaneous Insulin  Pen Inject 60 Units under the skin Once per day as needed (SLIDING SCALE)    insulin  lispro (HUMALOG  KWIKPEN INSULIN  SUBQ) Inject under the skin 7-20 units tid before meals    ipratropium/albuterol  sulfate (COMBIVENT INHL) Take 1 Puff by inhalation Four times a day as needed    lidocaine -prilocaine  (EMLA ) 2.5-2.5 % Cream Apply topically to Port-A-Cath site 30 minutes prior to excessive port.    lisinopriL (PRINIVIL) 20 mg Oral Tablet Take 1 Tablet (20 mg total) by mouth Daily    loratadine  (CLARITIN ) 10 mg Oral Tablet Take 1 Tablet (10 mg total) by mouth Once per day as needed    magnesium  chloride (SLOW-MAG) 64 mg Oral Tablet, Delayed Release (E.C.) Take 1 Tablet (64  mg total) by mouth Daily Slow mag 71.5 mg-119 mg days 2 tablets daily    MetFORMIN (GLUCOPHAGE) 1,000 mg Oral Tablet Take 1 Tablet (1,000 mg total) by mouth Every morning with breakfast    metoclopramide  HCl (REGLAN ) 5 mg/5 mL Oral Solution     metoprolol  succinate (TOPROL -XL) 50 mg Oral Tablet Sustained Release 24 hr Take 1 Tablet (50 mg total) by mouth Daily    nystatin  (MYCOSTATIN ) 100,000 unit/mL Oral Suspension Swish and spit 5 mL Four times a day    omeprazole (PRILOSEC) 40 mg Oral Capsule, Delayed Release(E.C.) Take 1 Capsule (40 mg total) by mouth Daily    ondansetron  (ZOFRAN  ODT) 8 mg Oral Tablet, Rapid Dissolve Take 1 Tablet (8 mg total) by mouth Every 8 hours as needed for Nausea/Vomiting (Patient not taking: Reported on 03/04/2024)    pregabalin  (LYRICA ) 75 mg Oral Capsule Take 1 Capsule (75 mg total) by mouth Twice daily    prochlorperazine  (COMPAZINE ) 10 mg Oral Tablet Take 1 Tablet (10 mg total) by mouth Twice per day as needed for Nausea/Vomiting    promethazine  (PHENERGAN ) 25 mg Oral Tablet Take 1 Tablet (25 mg total) by mouth Every 6 hours as needed for Nausea/Vomiting    promethazine  HCl (PROMETHAZINE , BULK,) 100 %  Powder     QUEtiapine  (SEROQUEL ) 50 mg Oral Tablet Take 1 Tablet (50 mg total) by mouth Daily    rOPINIRole  (REQUIP ) 1 mg Oral Tablet Take 1 Tablet (1 mg total) by mouth Three times a day    traMADoL  (ULTRAM  ER) 100 mg Oral Tablet Sustained Release 24 hr Take 1 Tablet (100 mg total) by mouth Daily (Patient not taking: Reported on 03/04/2024)    traMADoL  (ULTRAM ) 50 mg Oral Tablet TAKE TWO TABLETS BY MOUTH EVERY EIGHT HOURS    traZODone  (DESYREL ) 100 mg Oral Tablet Take 1 Tablet (100 mg total) by mouth Every night       Allergies[1]      PHYSICAL EXAM:  BP 125/77 (Site: Right Arm, Patient Position: Sitting, Cuff Size: Adult Large)   Pulse 86   Temp 36.2 C (97.1 F) (Temporal)   Resp 20   Ht 1.511 m (4' 11.5)   Wt 78.9 kg (174 lb)   BMI 34.56 kg/m        ECOG Status: (2)  Ambulatory and capable of self care, unable to carry out work activity, up and about > 50% or waking hours   Physical Exam  GENERAL: Alert, cooperative, well developed, no acute distress.  HEENT: Normocephalic, normal oropharynx, moist mucous membranes.  CHEST: Wheezing on auscultation.  CARDIOVASCULAR: Normal heart rate and rhythm, S1 and S2 normal without murmurs.  ABDOMEN: Soft, non-tender, non-distended, without organomegaly, normal bowel sounds.  EXTREMITIES: Swelling in hands and legs.  NEUROLOGICAL: Cranial nerves grossly intact, moves all extremities without gross motor or sensory deficit.        LABS:   Results  LABS  Occult stool for blood: Negative (12/06/2023)  CBC: WBC 2.0-4.0, Hb 10.9-11.7, PLT normal (01/2024)  Retinol: 17  Vitamin D : Low  Liver enzymes: AST 103, ALT 32, Alk Phos 114 (02/26/2024)  Phosphorus: 1.4 (01/2024)  Phosphorus: 2.9 (02/2024)    RADIOLOGY  Thyroid  ultrasound: Three nodules, left lobe enlarged, TIRADS 3 nodule       ASSESSMENT:    ICD-10-CM    1. Hyperparathyroidism (CMS HCC)  E21.3 PARATHYROID  HORMONE (PTH)      2. Iron  deficiency anemia, unspecified iron  deficiency anemia type  D50.9 FERRITIN     IRON  TRANSFERRIN AND TIBC     CBC     COMPREHENSIVE METABOLIC PANEL, NON-FASTING     MAGNESIUM       3. Hypomagnesemia  E83.42 MAGNESIUM       4. Wheezing  R06.2 XR CHEST PA AND LATERAL           Assessment & Plan  Severe protein-calorie malnutrition after gastrectomy  Severe chronic malnutrition secondary to total gastrectomy, resulting in inability to meet nutritional needs via oral intake. This is the primary contributor to her functional decline, fatigue, micronutrient deficiencies, anemia, electrolyte disturbances, generalized weakness, and poor quality of life. Nutritional improvement is essential for reversing or ameliorating her other medical conditions.  - Discussed recommendation for jejunostomy (J) tube placement for continuous enteral nutrition as optimal management for  nutritional deficits.  - Contacted nutrition and confirmed that most post-gastrectomy patients require supplemental enteral nutrition.  - Explained that J tube would allow continuous jejunal feeding, is likely necessary for improvement, and would not preclude oral intake.  - Informed her that the procedure may require referral to a center with appropriate surgical expertise, possibly outside the local area.  - Referral to [facility] Dr. Steen for J tube   - Reinforced that nutritional improvement is key to reversing  or improving her other medical problems.    Generalized edema  Significant generalized edema with rapid weight gain, attributed to severe hypoalbuminemia and protein malnutrition, resulting in third spacing of fluids. There is concern for progression to congestive heart failure if fluid overload persists. IV fluids are contraindicated due to risk of worsening edema and cardiac complications.  - Advised against further IV fluid administration due to risk of exacerbating edema and cardiac complications.  - Will monitor fluid status closely over the next several weeks.  - If no improvement, will consider initiating a very low dose diuretic to mobilize excess fluid, with caution to avoid intravascular depletion.  - Ordered chest x-ray to evaluate for pulmonary edema or other causes of respiratory symptoms.    Iron  deficiency anemia  Iron  deficiency anemia likely secondary to malnutrition and poor absorption post-gastrectomy. Anemia has improved with IV iron  supplementation; recent hemoglobin levels are within normal range. No evidence of active gastrointestinal bleeding based on negative occult blood tests.  - Ordered iron  studies to assess current iron  status.  - Will continue to monitor hemoglobin and iron  indices and repeat IV iron  as indicated by laboratory results.    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 Levels have fluctuated but are being  supplemented as needed.    Hypophosphatemia  Chronic hypophosphatemia with fluctuating levels despite supplementation, attributed to malnutrition and poor absorption. Phosphorus levels have improved somewhat but remain unstable.  - Ordered phosphorus infusion per standing plan.  - Maintained weekly phosphorus supplementation regimen.  - Ordered phosphorus labs to monitor response.    Hyperparathyroidism with parathyroid  nodules  Hyperparathyroidism with documented parathyroid  nodules, including a TIRADS 3 nodule on ultrasound and significant enlargement of the left parathyroid . She is under endocrinology care for this condition.  - Sent all recent laboratory results to her endocrinologist as requested.  - Continue to follow endocrinology recommendations regarding parathyroid  nodules and hyperparathyroidism.    Oral candidiasis  Oral candidiasis presenting as white coating in the mouth, sore throat, and peeling lips, likely related to nutritional deficiencies.  - Prescribed nystatin  mouthwash for oral candidiasis.    Wheezing  New onset wheezing with associated respiratory symptoms and mild discomfort. Concern for possible fluid overload.  - Ordered chest x-ray to evaluate for pulmonary edema or other pathology.    Return in about 4 weeks (around 04/01/2024).    Madisson Kulaga was given the chance to ask questions, and these were answered to their satisfaction. The patient is welcome to call with any questions or concerns in the meantime.     This note was created using [employer] M*Modal Fluency Align mobile application via conversational audio. Consent for audio recording was obtained by patient/family members prior to recording.      Randine Clara APRN, FNP-BC, AOCNP, 03/04/2024 , 11:59       This note was partially generated using MModal Fluency Direct system, and there may be some incorrect words, spellings, and punctuation that were not noted in checking the note before saving.   You can see your note(s) in  [facility]. It is common for you to encounter certain medical terminology which may be unfamiliar to you. You might see results before your provider does so please give at least 2 business days for review. Please have this understanding, that NOT all abnormal results are significant. Our office will contact you for any urgent or emergent action if necessary. If you have any questions or concerns, feel free to send  a MyChart message or call the office. Please call with any new or concerning symptoms.     CC:  Eleanor JONELLE Lee, FNP  [address]  [place] [address]                           [1]   Allergies  Allergen Reactions    Alcohol  Shortness of Breath     Etoh, not rubbing alcohol       Sulfa (Sulfonamides) Anaphylaxis    Ibuprofen     Influenza Virus Vaccines     Influenza Virus Vaccine, Specific Hives/ Urticaria    Penicillins Hives/ Urticaria    Pneumococcal Vaccine Hives/ Urticaria

## 2024-03-04 NOTE — Nurses Notes (Signed)
 Summary: pt arrived an received  potassium phos with .no complaints or distress noted during or after treatment.port accessed with no problem.assessment WNL.infusion complete,port deaccessed with no problem.pt down via walking with husband at DC no concerns voiced upon leaving floor.Marshall Shropshire, LPN

## 2024-03-05 ENCOUNTER — Other Ambulatory Visit (INDEPENDENT_AMBULATORY_CARE_PROVIDER_SITE_OTHER): Payer: Self-pay | Admitting: NURSE PRACTITIONER

## 2024-03-05 ENCOUNTER — Encounter (INDEPENDENT_AMBULATORY_CARE_PROVIDER_SITE_OTHER): Payer: Self-pay | Admitting: NURSE PRACTITIONER

## 2024-03-05 DIAGNOSIS — E43 Unspecified severe protein-calorie malnutrition: Secondary | ICD-10-CM

## 2024-03-05 DIAGNOSIS — E860 Dehydration: Secondary | ICD-10-CM

## 2024-03-05 NOTE — Nursing Note (Signed)
 Ronal Caldron called from Dr. Steen office, says he cannot do the J-tube, too extensive. Message sent to [facility] NP.

## 2024-03-06 ENCOUNTER — Ambulatory Visit (HOSPITAL_COMMUNITY): Payer: Self-pay

## 2024-03-09 ENCOUNTER — Other Ambulatory Visit (HOSPITAL_COMMUNITY): Payer: Self-pay

## 2024-03-09 DIAGNOSIS — Z1239 Encounter for other screening for malignant neoplasm of breast: Secondary | ICD-10-CM

## 2024-03-09 DIAGNOSIS — Z780 Asymptomatic menopausal state: Secondary | ICD-10-CM

## 2024-03-09 DIAGNOSIS — Z0000 Encounter for general adult medical examination without abnormal findings: Secondary | ICD-10-CM

## 2024-03-10 ENCOUNTER — Encounter (HOSPITAL_COMMUNITY): Payer: Self-pay

## 2024-03-10 ENCOUNTER — Other Ambulatory Visit: Payer: Self-pay

## 2024-03-10 ENCOUNTER — Ambulatory Visit: Payer: Self-pay

## 2024-03-10 ENCOUNTER — Encounter (HOSPITAL_PSYCHIATRIC): Payer: Self-pay

## 2024-03-10 VITALS — BP 110/73 | HR 86 | Resp 18 | Ht 59.0 in | Wt 162.0 lb

## 2024-03-10 DIAGNOSIS — F411 Generalized anxiety disorder: Secondary | ICD-10-CM | POA: Insufficient documentation

## 2024-03-10 DIAGNOSIS — G4700 Insomnia, unspecified: Secondary | ICD-10-CM | POA: Insufficient documentation

## 2024-03-10 DIAGNOSIS — F332 Major depressive disorder, recurrent severe without psychotic features: Secondary | ICD-10-CM | POA: Insufficient documentation

## 2024-03-10 MED ORDER — QUETIAPINE 100 MG TABLET: 100.0000 mg | ORAL_TABLET | Freq: Every evening | ORAL | 0 refills | Status: DC

## 2024-03-10 MED ORDER — DULOXETINE 30 MG CAPSULE,DELAYED RELEASE: 30.0000 mg | DELAYED_RELEASE_CAPSULE | Freq: Two times a day (BID) | ORAL | 0 refills | Status: DC

## 2024-03-10 MED ORDER — HYDROXYZINE PAMOATE 25 MG CAPSULE: 25.0000 mg | ORAL_CAPSULE | Freq: Every evening | ORAL | 0 refills | Status: DC
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
## 2024-03-10 NOTE — Patient Instructions (Signed)
 Hydroxyzine  Pamoate 25 mg take 1 cap by mouth nightly as needed for insomnia  Divalproex  ER 250 mg take 1 tab by mouth once daily for migraines (PCP to prescribe)  Duloxetine  30 mg take 1 tab by mouth twice daily anxiety, depression, and fibromyalgia  Increase Quetiapine  for 50 mg to 100 mg take 1 tab by mouth daily for insomnia  Discontinue Trazodone  100 mg take 1 tab by mouth at bedtime for insomnia

## 2024-03-11 ENCOUNTER — Ambulatory Visit
Admission: RE | Admit: 2024-03-11 | Discharge: 2024-03-11 | Disposition: A | Discharge status: Home / Self Care | Payer: Self-pay | Source: Ambulatory Visit | Attending: NURSE PRACTITIONER | Admitting: NURSE PRACTITIONER

## 2024-03-11 ENCOUNTER — Other Ambulatory Visit (INDEPENDENT_AMBULATORY_CARE_PROVIDER_SITE_OTHER): Payer: Self-pay | Admitting: NURSE PRACTITIONER

## 2024-03-11 ENCOUNTER — Ambulatory Visit

## 2024-03-11 VITALS — BP 123/80 | HR 91 | Temp 97.1°F | Resp 20

## 2024-03-11 DIAGNOSIS — D509 Iron deficiency anemia, unspecified: Secondary | ICD-10-CM

## 2024-03-11 DIAGNOSIS — R062 Wheezing: Secondary | ICD-10-CM

## 2024-03-11 DIAGNOSIS — D508 Other iron deficiency anemias: Secondary | ICD-10-CM | POA: Insufficient documentation

## 2024-03-11 DIAGNOSIS — E213 Hyperparathyroidism, unspecified: Secondary | ICD-10-CM

## 2024-03-11 DIAGNOSIS — E43 Unspecified severe protein-calorie malnutrition: Secondary | ICD-10-CM

## 2024-03-11 LAB — PHOSPHORUS: PHOSPHORUS: 1.4 mg/dL — ABNORMAL LOW (ref 3.7–7.2)

## 2024-03-11 MED ORDER — SODIUM CHLORIDE 0.9 % INTRAVENOUS SOLUTION
20.0000 mmol | Freq: Once | INTRAVENOUS | Status: AC
  Administered 2024-03-11: 20 mmol via INTRAVENOUS
  Filled 2024-03-11: qty 6.67

## 2024-03-11 MED ORDER — PROCHLORPERAZINE EDISYLATE 10 MG/2 ML (5 MG/ML) INJECTION SOLUTION
10.0000 mg | Freq: Once | INTRAMUSCULAR | Status: AC
  Administered 2024-03-11: 10 mg via INTRAVENOUS
  Filled 2024-03-11: qty 2

## 2024-03-11 NOTE — Nurses Notes (Signed)
 Summary: pt arrived an received  potassium phos with .no complaints or distress noted during or after treatment.port accessed with no problem.assessment WNL.infusion complete,port deaccessed with pressure dressing applied.pt down via walking with husband at DC no concerns voiced upon leaving floor.Marshall Shropshire, LPN

## 2024-03-17 ENCOUNTER — Other Ambulatory Visit (INDEPENDENT_AMBULATORY_CARE_PROVIDER_SITE_OTHER): Payer: Self-pay | Admitting: NURSE PRACTITIONER

## 2024-03-18 ENCOUNTER — Other Ambulatory Visit (INDEPENDENT_AMBULATORY_CARE_PROVIDER_SITE_OTHER): Payer: Self-pay | Admitting: NURSE PRACTITIONER

## 2024-03-18 ENCOUNTER — Other Ambulatory Visit: Payer: Self-pay

## 2024-03-18 ENCOUNTER — Ambulatory Visit
Admission: RE | Admit: 2024-03-18 | Discharge: 2024-03-18 | Disposition: A | Discharge status: Home / Self Care | Payer: Self-pay | Source: Ambulatory Visit | Attending: NURSE PRACTITIONER | Admitting: NURSE PRACTITIONER

## 2024-03-18 VITALS — BP 123/81 | HR 92 | Temp 97.8°F | Resp 18

## 2024-03-18 DIAGNOSIS — Z789 Other specified health status: Secondary | ICD-10-CM

## 2024-03-18 DIAGNOSIS — D508 Other iron deficiency anemias: Secondary | ICD-10-CM | POA: Insufficient documentation

## 2024-03-18 DIAGNOSIS — D72819 Decreased white blood cell count, unspecified: Secondary | ICD-10-CM

## 2024-03-18 LAB — PHOSPHORUS: PHOSPHORUS: 3.1 mg/dL — ABNORMAL LOW (ref 3.7–7.2)

## 2024-03-18 MED ORDER — PROCHLORPERAZINE EDISYLATE 10 MG/2 ML (5 MG/ML) INJECTION SOLUTION
10.0000 mg | Freq: Once | INTRAMUSCULAR | Status: AC
  Administered 2024-03-18: 10 mg via INTRAVENOUS
  Filled 2024-03-18: qty 2

## 2024-03-18 MED ORDER — SODIUM CHLORIDE 0.9 % INTRAVENOUS SOLUTION
20.0000 mmol | Freq: Once | INTRAVENOUS | Status: AC
  Administered 2024-03-18: 20 mmol via INTRAVENOUS
  Administered 2024-03-18: 0 mmol via INTRAVENOUS
  Filled 2024-03-18: qty 6.67

## 2024-03-18 NOTE — Nurses Notes (Signed)
 Summary- Patient arrived and [facility] right chest accessed using sterile tech. Lab drawn via PAC. Potassium phosphorus infused per order. Patient tolerated well. No s/s of distress noted. PAC deaccessed after 20 ml ns flush and band aid applied. Patient ambulated out to desk.Lura Budge, LPN

## 2024-03-20 ENCOUNTER — Other Ambulatory Visit (INDEPENDENT_AMBULATORY_CARE_PROVIDER_SITE_OTHER): Payer: Self-pay | Admitting: NURSE PRACTITIONER

## 2024-03-24 ENCOUNTER — Encounter (HOSPITAL_COMMUNITY): Payer: Self-pay

## 2024-03-25 ENCOUNTER — Other Ambulatory Visit: Payer: Self-pay

## 2024-03-25 ENCOUNTER — Ambulatory Visit
Admission: RE | Admit: 2024-03-25 | Discharge: 2024-03-25 | Disposition: A | Discharge status: Home / Self Care | Source: Ambulatory Visit | Attending: NURSE PRACTITIONER | Admitting: NURSE PRACTITIONER

## 2024-03-25 VITALS — BP 117/77 | HR 99 | Temp 98.1°F | Resp 18

## 2024-03-25 DIAGNOSIS — D508 Other iron deficiency anemias: Secondary | ICD-10-CM | POA: Insufficient documentation

## 2024-03-25 NOTE — Nurses Notes (Signed)
 Patient arrived for labs/poss K phos . PAC access using sterile tech and lab drawn via PAC and tubed to lab. Results of K phos  3.7. T Mckenzie notified levels are within normal limits and she does not want K phos  infused today. PAC flushed with 20 ml ns and deaccessed. Patient denies complaints or concerns. Ambulated out of room to check out desk.Lura Budge, LPN

## 2024-03-31 ENCOUNTER — Encounter (HOSPITAL_BASED_OUTPATIENT_CLINIC_OR_DEPARTMENT_OTHER): Payer: Self-pay

## 2024-03-31 ENCOUNTER — Emergency Department
Admission: EM | Admit: 2024-03-31 | Discharge: 2024-03-31 | Disposition: A | Discharge status: Home / Self Care | Attending: Emergency Medicine | Admitting: Emergency Medicine

## 2024-03-31 ENCOUNTER — Other Ambulatory Visit: Payer: Self-pay

## 2024-03-31 ENCOUNTER — Other Ambulatory Visit (INDEPENDENT_AMBULATORY_CARE_PROVIDER_SITE_OTHER): Payer: Self-pay | Admitting: NURSE PRACTITIONER

## 2024-03-31 ENCOUNTER — Ambulatory Visit (HOSPITAL_COMMUNITY): Payer: Self-pay

## 2024-03-31 ENCOUNTER — Encounter (HOSPITAL_COMMUNITY): Payer: Self-pay | Admitting: NURSE PRACTITIONER

## 2024-03-31 DIAGNOSIS — Z903 Acquired absence of stomach [part of]: Secondary | ICD-10-CM | POA: Insufficient documentation

## 2024-03-31 DIAGNOSIS — D649 Anemia, unspecified: Secondary | ICD-10-CM | POA: Insufficient documentation

## 2024-03-31 DIAGNOSIS — G629 Polyneuropathy, unspecified: Secondary | ICD-10-CM | POA: Insufficient documentation

## 2024-03-31 DIAGNOSIS — R739 Hyperglycemia, unspecified: Secondary | ICD-10-CM | POA: Insufficient documentation

## 2024-03-31 DIAGNOSIS — R790 Abnormal level of blood mineral: Secondary | ICD-10-CM

## 2024-03-31 DIAGNOSIS — D508 Other iron deficiency anemias: Secondary | ICD-10-CM

## 2024-03-31 DIAGNOSIS — R7401 Elevation of levels of liver transaminase levels: Secondary | ICD-10-CM | POA: Insufficient documentation

## 2024-03-31 DIAGNOSIS — E876 Hypokalemia: Secondary | ICD-10-CM | POA: Insufficient documentation

## 2024-03-31 LAB — CBC WITH DIFF
BASOPHIL #: 0.01 x10ˆ3/uL (ref 0.00–0.10)
BASOPHIL %: 0 % (ref 0–1)
EOSINOPHIL #: 0.09 x10ˆ3/uL (ref 0.00–0.50)
EOSINOPHIL %: 2 % (ref 1–7)
HCT: 31.4 % (ref 31.2–41.9)
HGB: 9.9 g/dL — ABNORMAL LOW (ref 10.9–14.3)
LYMPHOCYTE #: 0.77 x10ˆ3/uL — ABNORMAL LOW (ref 1.10–3.10)
LYMPHOCYTE %: 19 % (ref 16–46)
MCH: 27.9 pg (ref 24.7–32.8)
MCHC: 31.5 g/dL — ABNORMAL LOW (ref 32.3–35.6)
MCV: 88.7 fL (ref 75.5–95.3)
MONOCYTE #: 0.35 x10ˆ3/uL (ref 0.20–0.90)
MONOCYTE %: 9 % (ref 4–11)
MPV: 8.3 fL (ref 7.9–10.8)
NEUTROPHIL #: 2.84 x10ˆ3/uL (ref 1.90–8.20)
NEUTROPHIL %: 70 % (ref 43–77)
PLATELETS: 172 x10ˆ3/uL (ref 140–440)
RBC: 3.54 x10ˆ6/uL — ABNORMAL LOW (ref 3.63–4.92)
RDW: 18.7 % — ABNORMAL HIGH (ref 12.3–17.7)
WBC: 4.1 x10ˆ3/uL (ref 3.8–11.8)

## 2024-03-31 LAB — URINALYSIS, MACRO/MICRO
BILIRUBIN: NEGATIVE mg/dL
BLOOD: NEGATIVE mg/dL
GLUCOSE: NEGATIVE mg/dL
KETONES: NEGATIVE mg/dL
NITRITE: NEGATIVE
PH: 6 (ref 5.0–8.0)
PROTEIN: NEGATIVE mg/dL
SPECIFIC GRAVITY: 1.015 (ref 1.005–1.030)
UROBILINOGEN: 0.2 mg/dL

## 2024-03-31 LAB — URINALYSIS, MICROSCOPIC

## 2024-03-31 LAB — BASIC METABOLIC PANEL
ANION GAP: 14 mmol/L — ABNORMAL HIGH (ref 4–13)
BUN/CREA RATIO: 19
BUN: 17 mg/dL (ref 7–18)
CALCIUM: 7.6 mg/dL — ABNORMAL LOW (ref 8.5–10.1)
CHLORIDE: 112 mmol/L — ABNORMAL HIGH (ref 98–107)
CO2 TOTAL: 21 mmol/L (ref 21–32)
CREATININE: 0.9 mg/dL (ref 0.55–1.02)
ESTIMATED GFR: 73 mL/min/1.73mˆ2 (ref >59)
GLUCOSE: 176 mg/dL — ABNORMAL HIGH (ref 74–106)
OSMOLALITY, CALCULATED: 298 mosm/kg — ABNORMAL HIGH (ref 270–290)
POTASSIUM: 3.4 mmol/L — ABNORMAL LOW (ref 3.5–5.1)
SODIUM: 147 mmol/L — ABNORMAL HIGH (ref 136–145)

## 2024-03-31 LAB — HEPATIC FUNCTION PANEL
ALBUMIN/GLOBULIN RATIO: 0.8 (ref 0.8–1.4)
ALBUMIN: 2.6 g/dL — ABNORMAL LOW (ref 3.4–5.0)
ALKALINE PHOSPHATASE: 122 U/L — ABNORMAL HIGH (ref 46–116)
ALT (SGPT): 163 U/L — ABNORMAL HIGH (ref <=78)
AST (SGOT): 83 U/L — ABNORMAL HIGH (ref 15–37)
BILIRUBIN DIRECT: 0.1 mg/dL (ref 0.0–0.2)
BILIRUBIN TOTAL: 0.2 mg/dL (ref 0.2–1.0)
BILIRUBIN, INDIRECT: 0.1 mg/dL
GLOBULIN: 3.1
PROTEIN TOTAL: 5.7 g/dL — ABNORMAL LOW (ref 6.4–8.2)

## 2024-03-31 LAB — COVID-19, FLU A/B, RSV RAPID BY PCR
INFLUENZA VIRUS TYPE A: NOT DETECTED
INFLUENZA VIRUS TYPE B: NOT DETECTED
RESPIRATORY SYNCYTIAL VIRUS (RSV): NOT DETECTED
SARS-CoV-2: NOT DETECTED

## 2024-03-31 LAB — SCAN DIFFERENTIAL
PLATELET MORPHOLOGY COMMENT: ADEQUATE
RBC MORPHOLOGY COMMENT: NORMAL
SCHISTOCYTES: ABSENT

## 2024-03-31 LAB — PHOSPHORUS: PHOSPHORUS: 2.4 mg/dL — ABNORMAL LOW (ref 3.7–7.2)

## 2024-03-31 LAB — MAGNESIUM: MAGNESIUM: 1.5 mg/dL — ABNORMAL LOW (ref 1.8–2.4)

## 2024-03-31 MED ORDER — HEPARIN, PORCINE (PF) 100 UNIT/ML INTRAVENOUS SYRINGE
INJECTION | INTRAVENOUS | Status: AC
  Filled 2024-03-31: qty 5

## 2024-03-31 MED ORDER — MAGNESIUM SULFATE 2 GRAM/50 ML (4 %) IN WATER INTRAVENOUS PIGGYBACK
2.0000 g | INJECTION | Freq: Once | INTRAVENOUS | Status: AC
  Administered 2024-03-31: 0 g via INTRAVENOUS
  Administered 2024-03-31: 2 g via INTRAVENOUS

## 2024-03-31 MED ORDER — HEPARIN, PORCINE (PF) 100 UNIT/ML INTRAVENOUS SYRINGE
5.0000 mL | INJECTION | Freq: Once | INTRAVENOUS | Status: AC
  Administered 2024-03-31: 5 mL

## 2024-03-31 MED ORDER — MIDAZOLAM 5 MG/ML INJECTION WRAPPER
INTRAMUSCULAR | Status: AC
  Filled 2024-03-31: qty 1

## 2024-03-31 MED ORDER — ACETAMINOPHEN 1,000 MG/100 ML (10 MG/ML) INTRAVENOUS SOLUTION: 1000.0000 mg | Freq: Four times a day (QID) | INTRAVENOUS | Status: DC | PRN

## 2024-03-31 MED ORDER — CAPSAICIN 0.075 % TOPICAL CREAM: TOPICAL_CREAM | Freq: Four times a day (QID) | CUTANEOUS | 0 refills | Status: AC | PRN

## 2024-03-31 MED ORDER — POTASSIUM CHLORIDE ER 20 MEQ TABLET,EXTENDED RELEASE(PART/CRYST)
ORAL_TABLET | ORAL | Status: AC
  Filled 2024-03-31: qty 3

## 2024-03-31 MED ORDER — MAGNESIUM SULFATE 1 GRAM/100 ML IN DEXTROSE 5 % INTRAVENOUS PIGGYBACK
INJECTION | INTRAVENOUS | Status: AC
  Filled 2024-03-31: qty 100

## 2024-03-31 MED ORDER — MIDAZOLAM 5 MG/ML INJECTION WRAPPER
2.0000 mg | INTRAMUSCULAR | Status: AC
  Administered 2024-03-31: 2 mg via INTRAVENOUS

## 2024-03-31 MED ORDER — SODIUM CHLORIDE 0.9 % INTRAVENOUS SOLUTION
20.0000 mmol | Freq: Once | INTRAVENOUS | Status: DC
  Administered 2024-03-31: 0 mmol via INTRAVENOUS
  Filled 2024-03-31: qty 6.67

## 2024-03-31 MED ORDER — POTASSIUM CHLORIDE ER 20 MEQ TABLET,EXTENDED RELEASE(PART/CRYST)
60.0000 meq | ORAL_TABLET | ORAL | Status: AC
  Administered 2024-03-31: 60 meq via ORAL

## 2024-03-31 MED ORDER — SODIUM CHLORIDE 0.9 % IV BOLUS
1000.0000 mL | INJECTION | Status: AC
  Administered 2024-03-31: 0 mL via INTRAVENOUS
  Administered 2024-03-31: 1000 mL via INTRAVENOUS

## 2024-03-31 MED ORDER — MIDAZOLAM 5 MG/ML INJECTION WRAPPER
0.5000 mg | INTRAMUSCULAR | Status: AC
  Administered 2024-03-31: 0.5 mg via INTRAVENOUS

## 2024-03-31 MED ORDER — MAGNESIUM SULFATE 1 GRAM/100 ML IN DEXTROSE 5 % INTRAVENOUS PIGGYBACK
INJECTION | INTRAVENOUS | Status: AC
  Filled 2024-03-31: qty 200

## 2024-03-31 NOTE — ED Nurses Note (Signed)
 Pt still drowsy, report to stephanie,

## 2024-03-31 NOTE — ED Nurses Note (Signed)
 Pt states that she will go to her phosphorus appointment in the morning as scheduled.  Spouse at bedside. Provider notified and okay to discharge pt.

## 2024-03-31 NOTE — ED Attending Note (Signed)
 [facility], [facility] - Emergency Department  Emergency Department  Course Note    Care/report received from physician at  7:30 p.m.  Per report:  Martha White is a 59 y.o. female who had concerns including Weakness.  Patient with generalized weakness electrolyte imbalance.  Potassium magnesium  replaced unable to replace phosphorus due to medication not being available patient apparently has not infusion a phosphorus planned for 7 40 5:00 a.m. with her primary physician    Pending labs/imaging/consults:  Reviewed  Plan:  Patient discharged home follow-up in a.m. for put phosphorus infusion with primary physician    Course:   ED Course as of 03/31/24 2043   Tue Mar 31, 2024   2022 PHOSPHORUS   2039 The phosphorus infusion is not available at this time patient states she has phosphorus infusion in a.m. at 7:00 a.m. she would prefer to be discharged home and have infusion done in a.m. rather than wait for the medication to be made and sent from [facility] and then have an infusion of 4 hours.  Patient we will be discharged home and follow-up in a.m. to have infusion     Following the history, physical exam, and ED workup, the patient was deemed stable and suitable for discharge. The patient/caregiver was advised to return to the ED for any new or worsening symptoms. Discharge medications, and follow-up instructions were discussed with the patient/caregiver in detail, who verbalizes understanding. The patient/caregiver is in agreement and is comfortable with the plan of care.    Disposition: Discharged         Current Discharge Medication List        START taking these medications.        Details   Capsaicin  0.075 % Cream  Commonly known as: TRIXAICIN HP   Apply Topically, 4 TIMES DAILY PRN  Qty: 60 g  Refills: 0            CONTINUE these medications - NO CHANGES were made during your visit.        Details   albuterol  sulfate 90 mcg/actuation oral inhaler  Commonly known as: PROVENTIL  or VENTOLIN  or  PROAIR    1-2 Puffs, EVERY 6 HOURS PRN  Refills: 0     amLODIPine  10 mg Tablet  Commonly known as: NORVASC    10 mg, Daily  Refills: 0     ATORVASTATIN  ORAL   80 mg, Daily  Refills: 0     bethanechol chloride 5 mg Tablet  Commonly known as: URECHOLINE   bethanechol chloride 5 mg tablet   TAKE ONE TABLET BY MOUTH ONCE EVERY DAY  Refills: 0     cholecalciferol  (vitamin D3) 125 mcg (5,000 unit) Capsule   10,000 Units, Oral, Daily  Refills: 0     clindamycin phosphate 2 % Cream  Commonly known as: CLEOCIN   USE 1 APPLICATORFUL VAGINALLY AS DIRECTED NIGHTLY AT BEDTIME FOR 7 NIGHTS  Refills: 0     clopidogreL  75 mg Tablet  Commonly known as: PLAVIX    75 mg, Daily  Refills: 0     COMBIVENT INHL   1 Puff, 4 TIMES DAILY PRN  Refills: 0     cyanocobalamin 1,000 mcg/mL Solution  Commonly known as: VITAMIN B12   Inject 1 mL (1,000 mcg total) under the skin Every 14 days  Refills: 0     cyclobenzaprine  5 mg Tablet  Commonly known as: FLEXERIL    5 mg, 2 TIMES DAILY  Refills: 0     divalproex  250 mg  Tablet Sustained Release 24 hr  Commonly known as: DEPAKOTE  ER   250 mg  Refills: 0     docusate sodium 100 mg Capsule  Commonly known as: COLACE   100 mg by oral route.  Refills: 0     DULoxetine  30 mg Capsule, Delayed Release(E.C.)  Commonly known as: CYMBALTA  DR   30 mg, Oral, 2 TIMES DAILY  Qty: 60 Capsule  Refills: 0     famotidine  20 mg Tablet  Commonly known as: PEPCID    20 mg, Daily  Refills: 0     furosemide  20 mg Tablet  Commonly known as: LASIX    20 mg, DAILY PRN  Refills: 0     HUMALOG  KWIKPEN INSULIN  SUBQ   Inject under the skin 7-20 units tid before meals  Refills: 0     hydrOXYzine  pamoate 25 mg Capsule  Commonly known as: VISTARIL    25 mg, Oral, NIGHTLY  Qty: 30 Capsule  Refills: 0     Levemir FlexPen 100 unit/mL (3 mL) Insulin  Pen  Generic drug: insulin  detemir U-100   60 Units, DAILY PRN  Refills: 0     lidocaine -prilocaine  2.5-2.5 % Cream  Commonly known as: EMLA    Apply topically to Port-A-Cath site 30 minutes prior  to excessive port.  Qty: 30 g  Refills: 3     lisinopriL 20 mg Tablet  Commonly known as: PRINIVIL   20 mg, Daily  Refills: 0     loratadine  10 mg Tablet  Commonly known as: CLARITIN    10 mg, DAILY PRN  Refills: 0     magnesium  chloride 64 mg Tablet, Delayed Release (E.C.)  Commonly known as: SLOW-MAG   64 mg, Daily  Refills: 0     MetFORMIN 1,000 mg Tablet  Commonly known as: GLUCOPHAGE   1,000 mg, EVERY MORNING WITH BREAKFAST  Refills: 0     metoclopramide  HCl 5 mg/5 mL Solution  Commonly known as: REGLAN    Refills: 0     metoprolol  succinate 50 mg Tablet Sustained Release 24 hr  Commonly known as: TOPROL -XL   50 mg, Daily  Refills: 0     nystatin  100,000 unit/mL Suspension  Commonly known as: MYCOSTATIN    5 mL, Swish & Spit, 4 TIMES DAILY  Qty: 240 mL  Refills: 0     omeprazole 40 mg Capsule, Delayed Release(E.C.)  Commonly known as: PRILOSEC   40 mg, Daily  Refills: 0     pregabalin  75 mg Capsule  Commonly known as: LYRICA    75 mg, 2 TIMES DAILY  Refills: 0     prochlorperazine  10 mg Tablet  Commonly known as: COMPAZINE    10 mg, 2 TIMES DAILY PRN  Refills: 0     * Promethazine  (Bulk) 100 % Powder   Refills: 0     * promethazine  25 mg Tablet  Commonly known as: PHENERGAN    25 mg, EVERY 6 HOURS PRN  Refills: 0     QUEtiapine  100 mg Tablet  Commonly known as: SEROquel    100 mg, Oral, NIGHTLY  Qty: 30 Tablet  Refills: 0     rOPINIRole  1 mg Tablet  Commonly known as: REQUIP    1 mg, 3 TIMES DAILY  Refills: 0     Trulicity 0.75 mg/0.5 mL Pen Injector  Generic drug: dulaglutide   0.75 mg, EVERY 7 DAYS  Refills: 0           * This list has 2 medication(s) that are the same as other medications prescribed for  you. Read the directions carefully, and ask your doctor or other care provider to review them with you.                ASK your doctor about these medications.        Details   traMADoL  50 mg Tablet  Commonly known as: ULTRAM   Ask about: Which instructions should I use?   TAKE TWO TABLETS BY MOUTH EVERY EIGHT  HOURS  Refills: 0            Follow up:   Lennie Eleanor SAUNDERS, FNP  [address]  [address]  [place] [address]  [phone]    Schedule an appointment as soon as possible for a visit   Follow-up in a.m. as planned for infusion      Clinical Impression   Neuropathy (CMS HCC) (Primary)   Hypokalemia   Hyperglycemia   Hypomagnesemia   Low serum phosphorus for age         Jesslyn Ponto, MD

## 2024-03-31 NOTE — Discharge Instructions (Addendum)
 See handouts for details, please follow-up as recommended in a.m. for infusion of phosphorus

## 2024-03-31 NOTE — ED Nurses Note (Signed)
Patient discharged home with family/self with treatment of initial complaint upon arrival to ER.  AVS reviewed by physician with patient/care giver.  A written copy of the AVS and discharge instructions was given to the patient/care giver. Scripts handed to patient/care giver or sent to pharmacy indicated on AVS print out. Pt verbalized understanding of taking any medications as prescribed. Questions and concerns sufficiently answered as needed.  Patient/care giver encouraged to follow up with PCP as indicated.  In the event of an emergency, patient/care giver instructed to call 911 or go to the nearest emergency room. No other concerns voiced at time of discharge to physician or nurse. Respiration even and unlabored. Pt ambulatory out of ED with husband.

## 2024-03-31 NOTE — ED Provider Notes (Signed)
  [facility]  ED Primary Provider Note  Patient Name: Martha White  Patient Age: 59 y.o.  Date of Birth: 07-10-1965    Chief Complaint: Weakness (States increased weakness with nausea and feels like she's being electrocuted. Also c/o dizziness.)              Historical Data   History Reviewed This Encounter: Medical History  Surgical History  Family History  Social History    ED Triage Vitals [03/31/24 1558]   BP (Non-Invasive) (!) 141/83   Heart Rate (!) 101   Respiratory Rate 20   Temperature 36.8 C (98.2 F)   SpO2 100 %   Weight 73.5 kg (162 lb)   Height 1.499 m (4' 11)           History of Present Illness   had concerns including Weakness.     Martha White is a 59 y.o. 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  She reports she is having shooting pains along both of her arms and reports burning in her feet and hands.  Reports generalized weakness, this is ongoing for quite some time.  Patient is concerned that she says the symptoms are associated with her low iron  and phosphate levels, she is also presenting wanting relief from the symptoms, she is currently on pregabalin . 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      ROS:  All other review of systems negative unless otherwise stated in HPI.    Physical:    Nursing notes reviewed for what could be assessed. Past Medical, Surgical, and Social history reviewed for what has been completed.     Constitutional:  Patient in the room and anxious  HENT:  Head atraumatic, no nasal congestion, moist mucous membranes  Eyes:  EOMI, pupils equal and round, conjunctiva normal.  Neck: Supple  Cardiovascular:  Tachycardic rate and  regular Rhythm with no murmurs rubs or gallops, no pulse deficits in the extremities, no peripheral edema  Pulmonary:  No respiratory distress. Lungs are symmetric to auscultation bilaterally.  Abdominal: Soft, non-tender  MSK:  No deformities or swelling and moving all extremities  Skin: Warm, dry  Neuro:  Alert and oriented, cranial nerves 2-12 grossly intact, no focal motor deficits    MDM:    Vitals reviewed, tachycardic.  Labs obtained show hemoglobin of 9.9, there are electrolyte disturbances with sodium 147, potassium 3.4, magnesium  1.5, glucose 176, anion gap 14.  Bicarb 21 hepatic function panel shows low albumin , corrected calcium 8.7, chronic transaminitis with normal bilirubin.  COVID flu and RSV negative.  Patient was given Ofirmev  and Versed  which seemed to improve her symptoms.  Patient has follow up with her primary care doctor tomorrow, we discussed abnormal lab results in anemia however she can arrange close follow-up for this.  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    Procedures      Patient Data     Labs Ordered/Reviewed   BASIC METABOLIC PANEL - Abnormal; Notable for the following components:       Result Value  SODIUM 147 (*)     POTASSIUM 3.4 (*)     CHLORIDE 112 (*)     ANION GAP 14 (*)     CALCIUM 7.6 (*)     GLUCOSE 176 (*)     OSMOLALITY, CALCULATED 298 (*)     All other components within normal limits    Narrative:     Estimated Glomerular Filtration Rate (eGFR) is calculated using the CKD-EPI (2021) equation, intended for patients 24 years of age and older. If gender is not documented or unknown, there will be no eGFR calculation.   CBC WITH DIFF - Abnormal; Notable for the following components:    RBC 3.54 (*)     HGB 9.9 (*)     MCHC 31.5 (*)     RDW 18.7 (*)     LYMPHOCYTE # 0.77 (*)     All  other components within normal limits   PHOSPHORUS - Abnormal; Notable for the following components:    PHOSPHORUS 2.4 (*)     All other components within normal limits   MAGNESIUM  - Abnormal; Notable for the following components:    MAGNESIUM  1.5 (*)     All other components within normal limits   URINALYSIS, MACRO/MICRO - Abnormal; Notable for the following components:    APPEARANCE Hazy (*)     LEUKOCYTE ESTERASE Small (1+) (*)     All other components within normal limits   HEPATIC FUNCTION PANEL - Abnormal; Notable for the following components:    ALBUMIN  2.6 (*)     ALKALINE PHOSPHATASE 122 (*)     ALT (SGPT) 163 (*)     AST (SGOT) 83 (*)     PROTEIN TOTAL 5.7 (*)     All other components within normal limits   URINALYSIS, MICROSCOPIC - Abnormal; Notable for the following components:    BACTERIA Few (*)     SQUAMOUS EPITHELIAL Few (*)     All other components within normal limits   COVID-19, FLU A/B, RSV RAPID BY PCR - Normal    Narrative:     Results are for the simultaneous qualitative identification of SARS-CoV-2 (formerly 2019-nCoV), Influenza A, Influenza B, and RSV RNA. These etiologic agents are generally detectable in nasopharyngeal and nasal swabs during the ACUTE PHASE of infection. Hence, this test is intended to be performed on respiratory specimens collected from individuals with signs and symptoms of upper respiratory tract infection who meet Centers for Disease Control and Prevention (CDC) clinical and/or epidemiological criteria for Coronavirus Disease 2019 (COVID-19) testing. CDC COVID-19 criteria for testing on human specimens is available at [facility] webpage information for Healthcare Professionals: Coronavirus Disease 2019 (COVID-19) ([email or web address]).     False-negative results may occur if the virus has genomic mutations, insertions, deletions, or rearrangements or if performed very early in the course of illness. Otherwise, negative results indicate  virus specific RNA targets are not detected, however negative results do not preclude SARS-CoV-2 infection/COVID-19, Influenza, or Respiratory syncytial virus infection. Results should not be used as the sole basis for patient management decisions. Negative results must be combined with clinical observations, patient history, and epidemiological information. If upper respiratory tract infection is still suspected based on exposure history together with other clinical findings, re-testing should be considered.    Test methodology:   Cepheid Xpert Xpress SARS-CoV-2/Flu/RSV Assay real-time polymerase chain reaction (RT-PCR) test on the GeneXpert Dx and Xpert Xpress systems.   CBC/DIFF    Narrative:     The following orders were  created for panel order CBC/DIFF.  Procedure                               Abnormality         Status                     ---------                               -----------         ------                     CBC WITH IPQQ[[id number]]                Abnormal            Final result                 Please view results for these tests on the individual orders.   SCAN DIFFERENTIAL   URINALYSIS,WITH REFLEX MICROSCOPIC AND REFLEX CULTURE IF POSITIVE    Narrative:     The following orders were created for panel order URINALYSIS,WITH REFLEX MICROSCOPIC AND REFLEX CULTURE IF POSITIVE.  Procedure                               Abnormality         Status                     ---------                               -----------         ------                     URINALYSIS, MACRO/MICRO[[id number]]      Abnormal            Final result               URINALYSIS, MICROSCOPIC[[id number]]      Abnormal            Final result                 Please view results for these tests on the individual orders.       No orders to display                                           Medications Ordered/Administered in the ED   magnesium  sulfate 2 G in SW 50 mL premix IVPB (0 g Intravenous Stopped 03/31/24 1900)   potassium  chloride (K-DUR) extended release tablet (60 mEq Oral Given 03/31/24 1745)   NS bolus infusion 1,000 mL (0 mL Intravenous Stopped 03/31/24 1846)   midazolam  (VERSED ) 5 mg/mL injection (2 mg Intravenous Given 03/31/24 1758)   midazolam  (VERSED ) 5 mg/mL injection (0.5 mg Intravenous Given 03/31/24 1932)   heparin  flush PF (HEPLOCK) 100 units/mL injection (5 mL Intracatheter Given 03/31/24 2055)       Disposition pending at the time of sign out. Care of Martha White was signed out to Dr. Bonna at 730p following  a discussion of the patient's course. Please refer to their Course Note for further details of the patient's ED course.        Risk as noted in the medical decision-making is in reference to potential morbidity/mortality of management based upon previously established billing guidelines.    Based upon the clinical setting, the likely diagnosis/impression include:    Clinical Impression   Neuropathy (CMS HCC) (Primary)   Hypokalemia   Hyperglycemia   Hypomagnesemia   Low serum phosphorus for age         Discharge Medication List as of 03/31/2024  8:40 PM        START taking these medications    Details   Capsaicin  (TRIXAICIN HP) 0.075 % Cream Apply topically Four times a day as needed  Disp-60 g, R-0  E-Rx                   This note was partially created using voice recognition software and is inherently subject to errors including those of syntax and sound alike  substitutions which may escape proof reading. In such instances, original meaning may be extrapolated by contextual derivation.

## 2024-03-31 NOTE — ED Nurses Note (Signed)
 Rt chest port accessed, pt tolerated well

## 2024-03-31 NOTE — ED Nurses Note (Signed)
 Pt doesn't want a urine test, she is ready to go home

## 2024-03-31 NOTE — ED Nurses Note (Signed)
 Pt assisted to bathroom and back into room without incident. Pt alert and oriented at this time.

## 2024-03-31 NOTE — ED Nurses Note (Signed)
 Pt offered any comfort measures (food, water, repositioning, restroom, etc.). Pt expresses no other concerns or needs at this time. Plan of care ongoing. Call light in reach.

## 2024-03-31 NOTE — ED Nurses Note (Signed)
 Pt is very drowsy after versed , dr rodgers to room to examine pt.

## 2024-04-01 ENCOUNTER — Ambulatory Visit (HOSPITAL_BASED_OUTPATIENT_CLINIC_OR_DEPARTMENT_OTHER): Payer: Self-pay | Admitting: NURSE PRACTITIONER

## 2024-04-01 ENCOUNTER — Ambulatory Visit (INDEPENDENT_AMBULATORY_CARE_PROVIDER_SITE_OTHER): Admission: RE | Admit: 2024-04-01 | Discharge: 2024-04-01 | Attending: NURSE PRACTITIONER

## 2024-04-01 ENCOUNTER — Ambulatory Visit (INDEPENDENT_AMBULATORY_CARE_PROVIDER_SITE_OTHER): Payer: Self-pay | Admitting: NURSE PRACTITIONER

## 2024-04-01 ENCOUNTER — Ambulatory Visit
Admission: RE | Admit: 2024-04-01 | Discharge: 2024-04-01 | Disposition: A | Discharge status: Home / Self Care | Source: Ambulatory Visit | Attending: NURSE PRACTITIONER | Admitting: NURSE PRACTITIONER

## 2024-04-01 ENCOUNTER — Other Ambulatory Visit (INDEPENDENT_AMBULATORY_CARE_PROVIDER_SITE_OTHER): Payer: Self-pay | Admitting: NURSE PRACTITIONER

## 2024-04-01 ENCOUNTER — Encounter (INDEPENDENT_AMBULATORY_CARE_PROVIDER_SITE_OTHER): Payer: Self-pay | Admitting: NURSE PRACTITIONER

## 2024-04-01 ENCOUNTER — Encounter (HOSPITAL_COMMUNITY): Payer: Self-pay

## 2024-04-01 VITALS — BP 131/81 | HR 88 | Temp 97.3°F | Ht 59.0 in | Wt 182.8 lb

## 2024-04-01 DIAGNOSIS — E876 Hypokalemia: Secondary | ICD-10-CM

## 2024-04-01 DIAGNOSIS — D508 Other iron deficiency anemias: Secondary | ICD-10-CM | POA: Insufficient documentation

## 2024-04-01 DIAGNOSIS — E43 Unspecified severe protein-calorie malnutrition: Secondary | ICD-10-CM

## 2024-04-01 DIAGNOSIS — E213 Hyperparathyroidism, unspecified: Secondary | ICD-10-CM

## 2024-04-01 DIAGNOSIS — Z6836 Body mass index (BMI) 36.0-36.9, adult: Secondary | ICD-10-CM | POA: Insufficient documentation

## 2024-04-01 DIAGNOSIS — R7989 Other specified abnormal findings of blood chemistry: Secondary | ICD-10-CM

## 2024-04-01 DIAGNOSIS — E778 Other disorders of glycoprotein metabolism: Secondary | ICD-10-CM

## 2024-04-01 DIAGNOSIS — D509 Iron deficiency anemia, unspecified: Secondary | ICD-10-CM

## 2024-04-01 DIAGNOSIS — G629 Polyneuropathy, unspecified: Secondary | ICD-10-CM | POA: Insufficient documentation

## 2024-04-01 DIAGNOSIS — E46 Unspecified protein-calorie malnutrition: Secondary | ICD-10-CM | POA: Insufficient documentation

## 2024-04-01 DIAGNOSIS — E8809 Other disorders of plasma-protein metabolism, not elsewhere classified: Secondary | ICD-10-CM | POA: Insufficient documentation

## 2024-04-01 DIAGNOSIS — R062 Wheezing: Secondary | ICD-10-CM

## 2024-04-01 DIAGNOSIS — Z903 Acquired absence of stomach [part of]: Secondary | ICD-10-CM | POA: Insufficient documentation

## 2024-04-01 DIAGNOSIS — E878 Other disorders of electrolyte and fluid balance, not elsewhere classified: Secondary | ICD-10-CM | POA: Insufficient documentation

## 2024-04-01 LAB — IRON TRANSFERRIN AND TIBC
IRON (TRANSFERRIN) SATURATION: 27 % (ref 15–50)
IRON: 73 ug/dL (ref 50–212)
TOTAL IRON BINDING CAPACITY: 267 ug/dL (ref 250–450)
TRANSFERRIN: 191 mg/dL — ABNORMAL LOW (ref 203–362)
UIBC: 194 ug/dL (ref 130–375)

## 2024-04-01 LAB — POTASSIUM: POTASSIUM: 4 mmol/L (ref 3.5–5.1)

## 2024-04-01 LAB — PARATHYROID HORMONE (PTH): PTH: 292.1 pg/mL — ABNORMAL HIGH (ref 12.0–88.0)

## 2024-04-01 LAB — PHOSPHORUS: PHOSPHORUS: 3.6 mg/dL — ABNORMAL LOW (ref 3.7–7.2)

## 2024-04-01 LAB — FERRITIN: FERRITIN: 344 ng/mL — ABNORMAL HIGH (ref 11–307)

## 2024-04-01 LAB — IONIZED CALCIUM WITH PH
IONIZED CALCIUM: 1.2 mmol/L (ref 1.15–1.33)
PH (VENOUS): 7.34 (ref 7.32–7.43)

## 2024-04-01 MED ORDER — ALBUMIN, HUMAN 25 % INTRAVENOUS SOLUTION
25.0000 g | Freq: Once | INTRAVENOUS | Status: AC
  Administered 2024-04-01: 0 g via INTRAVENOUS
  Administered 2024-04-01: 25 g via INTRAVENOUS
  Filled 2024-04-01: qty 100

## 2024-04-01 MED ORDER — SODIUM CHLORIDE 0.9 % INTRAVENOUS SOLUTION
20.0000 mmol | Freq: Once | INTRAVENOUS | Status: AC
  Administered 2024-04-01: 0 mmol via INTRAVENOUS
  Administered 2024-04-01: 20 mmol via INTRAVENOUS
  Filled 2024-04-01: qty 6.67

## 2024-04-01 MED ORDER — SODIUM CHLORIDE 0.9 % IV BOLUS
500.0000 mL | INJECTION | Freq: Once | Status: AC
  Administered 2024-04-01: 0 mL via INTRAVENOUS
  Administered 2024-04-01: 500 mL via INTRAVENOUS

## 2024-04-01 MED ORDER — PROCHLORPERAZINE EDISYLATE 10 MG/2 ML (5 MG/ML) INJECTION SOLUTION
10.0000 mg | Freq: Once | INTRAMUSCULAR | Status: AC
  Administered 2024-04-01: 10 mg via INTRAVENOUS
  Filled 2024-04-01: qty 2

## 2024-04-01 NOTE — Nurses Notes (Signed)
 Patient arrived on unit, ambulatory, and was checked in at [phone]. Patient brought to room 221A and VS obtained. PORT accessed using sterile technique, per hospital policy and labs obtained per order via PORT draw at 0934. 0945 IVF bolus of 500ml/ normal saline began. Patient tolerated well. IVF bolus completed at 1015. 1013 IV albumin  ordered and completed at 1043. 1057 K-Phos began. Patient tolerating without any difficulty. Patient resting in reclining chair with eyes closed at this time. Therisa Bachelor, RN  1400: K-Phos continues to infuse without any adverse reactions noted. Patient is awake, eating a snack and watching television at this time. Therisa Bachelor, RN  [phone]: K-Phos infusion completed at this time. DC VS obtained and noted to be stable. Therisa Bachelor, RN  [phone]: PORT deaccessed per hospital protocol. Therisa Bachelor, RN  [phone]: Patient leaving unit ambulatory at this time. Therisa Bachelor, RN

## 2024-04-01 NOTE — Cancer Center Note (Signed)
 Martha White  [id number]  1965/08/27   04/01/2024       Department of Hematology/Oncology  Return Patient Visit           REFERRING PROVIDER:  Lennie Eleanor SAUNDERS, FNP  [address]  [address]  [place],  [place] [address]    PCP:  Eleanor SAUNDERS Lennie, FNP  [address]  [place] [address]          REASON FOR OFFICE VISIT:  Ongoing management and evaluation of  Iron  deficiency, low phosphorus, low magnesium , low calcium, low potassium.  Elevated PTH      HISTORY OF PRESENT ILLNESS:  History of Present Illness  Martha White is a 60 year old female with chronic protein-calorie malnutrition and iron  deficiency anemia following total gastrectomy who presents for hematology/oncology follow-up after an ER visit for acute neuropathic pain and worsening nutritional deficits.    Peripheral Neuropathy:  - Severe, diffuse neuropathic pain with electric shock sensations, burning, cramping, and profound numbness, predominantly in the legs and feet  - Pain refractory to intravenous sedation with midazolam  in the ER  - Persistent discomfort and amnesia for events following sedation  - Ongoing significant neuropathic pain and numbness, described as similar to fire ants biting her extremities    Electrolyte Disturbances:  - Recent laboratory studies show hypomagnesemia, hypophosphatemia, hypokalemia, and hypocalcemia (7.6 mg/dL)  - Fluctuating potassium levels, with prior episodes as low as 1.3-1.4 mmol/L  - Received intravenous magnesium  and attempted oral potassium supplementation, but unable to tolerate large pills due to dysphagia and vomiting  - Phosphorus supplementation not available during recent ER visit  - Dehydration with elevated osmolality and mildly increased sodium; renal function remains normal    Iron  Deficiency Anemia:  - Worsening anemia with hemoglobin decreased to 9.9 g/dL (previously in the 88d after iron  therapy)  - Multiple prior iron  infusions    Protein-Calorie Malnutrition and Gastrointestinal  Symptoms:  - Chronic gastrointestinal symptoms including worsening nausea, vomiting, and diarrhea  - Inability to maintain adequate nutrition and electrolyte balance  - Unable to tolerate oral potassium  - Hypoalbuminemia (2.6 g/dL)  - Frequent hospitalizations for management of nutritional and electrolyte deficits since total gastrectomy approximately two and a half years ago    Post-Gastrectomy Status and Access Issues:  - Status post total gastrectomy approximately two and a half years ago  - Port in place for intravenous access, accessed repeatedly for laboratory draws and infusions, occasionally with difficulty and pain but without infection or malfunction    Nutritional Support and Care Coordination:  - No gastroenterologist identified to place a jejunostomy tube for enteral nutrition  - Concern about infection risk associated with feeding tubes  - Scheduled for evaluation at the [facility] on May 01, 2024  - Frustration with prior care experiences and ongoing challenges managing symptoms and deficiencies          REVIEW OF SYSTEMS:    Past Medical History:   Diagnosis Date    Arthropathy     Asthma     Carpal tunnel syndrome     Carpal tunnel syndrome     Congestive heart failure     COPD (chronic obstructive pulmonary disease)     Depression     Diabetes mellitus, type 2     Fibromyalgia     Gastroparesis     Headache     HTN (hypertension)     Lumbar herniated disc     Neuropathy (CMS HCC)  Restless legs syndrome (RLS)     Thyroid  disease     TIA (transient ischemic attack)     TIA (transient ischemic attack)            Past Surgical History:   Procedure Laterality Date    HX CHOLECYSTECTOMY      HX HERNIA REPAIR      HX HERNIA REPAIR      HX HYSTERECTOMY      HX ROTATOR CUFF REPAIR      SHOULDER OPEN ROTATOR CUFF REPAIR Right     STOMACH SURGERY      X4           Social History     Socioeconomic History    Marital status: Married     Spouse name: Not on file    Number of children: Not on file     Years of education: Not on file    Highest education level: Not on file   Occupational History    Not on file   Tobacco Use    Smoking status: Never    Smokeless tobacco: Never   Vaping Use    Vaping status: Never Used   Substance and Sexual Activity    Alcohol  use: Never    Drug use: Never    Sexual activity: Never   Other Topics Concern    Not on file   Social History Narrative    Not on file     Social Determinants of Health     Financial Resource Strain: Not on file   Transportation Needs: Not on file   Social Connections: Low Risk (01/01/2024)    Social Connections     SDOH Social Isolation: 5 or more times a week   Recent Concern: Social Connections - Medium Risk (12/07/2023)    Social Connections     SDOH Social Isolation: 3 to 5 times a week   Intimate Partner Violence: Not on file   Housing Stability: Not on file       Social History     Social History Narrative    Not on file       Social History     Substance and Sexual Activity   Drug Use Never       Family Medical History:       Problem Relation (Age of Onset)    Alzheimer's/Dementia Maternal Grandmother    Heart Attack Maternal Grandfather    Hypertension (High Blood Pressure) Mother, Maternal Grandmother, Maternal Grandfather    Pancreatic Cancer Maternal Grandmother              Current Outpatient Medications   Medication Sig    albuterol  sulfate (PROVENTIL  OR VENTOLIN  OR PROAIR ) 90 mcg/actuation Inhalation oral inhaler Take 1-2 Puffs by inhalation Every 6 hours as needed    amLODIPine  (NORVASC ) 10 mg Oral Tablet Take 1 Tablet (10 mg total) by mouth Daily    atorvastatin  calcium (ATORVASTATIN  ORAL) Take 80 mg by mouth Once a day    bethanechol chloride (URECHOLINE) 5 mg Oral Tablet bethanechol chloride 5 mg tablet   TAKE ONE TABLET BY MOUTH ONCE EVERY DAY    Capsaicin  (TRIXAICIN HP) 0.075 % Cream Apply topically Four times a day as needed    cholecalciferol , vitamin D3, 125 mcg (5,000 unit) Oral Capsule Take 2 Capsules (10,000 Units total) by mouth  Daily    clindamycin phosphate (CLEOCIN) 2 % Vaginal Cream USE 1 APPLICATORFUL VAGINALLY AS DIRECTED NIGHTLY AT BEDTIME FOR 7 NIGHTS  clopidogreL  (PLAVIX ) 75 mg Oral Tablet Take 1 Tablet (75 mg total) by mouth Daily    cyanocobalamin (VITAMIN B12) 1,000 mcg/mL Injection Solution Inject 1 mL (1,000 mcg total) under the skin Every 14 days    cyclobenzaprine  (FLEXERIL ) 5 mg Oral Tablet Take 1 Tablet (5 mg total) by mouth Twice daily    divalproex  (DEPAKOTE  ER) 250 mg Oral Tablet Sustained Release 24 hr Take 1 Tablet (250 mg total) by mouth    docusate sodium (COLACE) 100 mg Oral Capsule 100 mg by oral route. (Patient not taking: Reported on 03/04/2024)    dulaglutide (TRULICITY) 0.75 mg/0.5 mL Subcutaneous Pen Injector Inject 0.5 mL (0.75 mg total) under the skin Every 7 days    DULoxetine  (CYMBALTA  DR) 30 mg Oral Capsule, Delayed Release(E.C.) Take 1 Capsule (30 mg total) by mouth Twice daily    famotidine  (PEPCID ) 20 mg Oral Tablet Take 1 Tablet (20 mg total) by mouth Daily    furosemide  (LASIX ) 20 mg Oral Tablet Take 1 Tablet (20 mg total) by mouth Once per day as needed    hydrOXYzine  pamoate (VISTARIL ) 25 mg Oral Capsule Take 1 Capsule (25 mg total) by mouth Every night for 30 days    insulin  detemir U-100 (LEVEMIR FLEXPEN) 100 unit/mL (3 mL) Subcutaneous Insulin  Pen Inject 60 Units under the skin Once per day as needed (SLIDING SCALE) (Patient not taking: Reported on 04/01/2024)    insulin  lispro (HUMALOG  KWIKPEN INSULIN  SUBQ) Inject under the skin 7-20 units tid before meals    ipratropium/albuterol  sulfate (COMBIVENT INHL) Take 1 Puff by inhalation Four times a day as needed    lidocaine -prilocaine  (EMLA ) 2.5-2.5 % Cream Apply topically to Port-A-Cath site 30 minutes prior to excessive port.    lisinopriL (PRINIVIL) 20 mg Oral Tablet Take 1 Tablet (20 mg total) by mouth Daily    loratadine  (CLARITIN ) 10 mg Oral Tablet Take 1 Tablet (10 mg total) by mouth Once per day as needed    magnesium  chloride (SLOW-MAG)  64 mg Oral Tablet, Delayed Release (E.C.) Take 1 Tablet (64 mg total) by mouth Daily Slow mag 71.5 mg-119 mg days 2 tablets daily (Patient not taking: Reported on 04/01/2024)    MetFORMIN (GLUCOPHAGE) 1,000 mg Oral Tablet Take 1 Tablet (1,000 mg total) by mouth Every morning with breakfast    metoclopramide  HCl (REGLAN ) 5 mg/5 mL Oral Solution     metoprolol  succinate (TOPROL -XL) 50 mg Oral Tablet Sustained Release 24 hr Take 1 Tablet (50 mg total) by mouth Daily    nystatin  (MYCOSTATIN ) 100,000 unit/mL Oral Suspension Swish and spit 5 mL Four times a day    omeprazole (PRILOSEC) 40 mg Oral Capsule, Delayed Release(E.C.) Take 1 Capsule (40 mg total) by mouth Daily    pregabalin  (LYRICA ) 75 mg Oral Capsule Take 1 Capsule (75 mg total) by mouth Twice daily    prochlorperazine  (COMPAZINE ) 10 mg Oral Tablet Take 1 Tablet (10 mg total) by mouth Twice per day as needed for Nausea/Vomiting    promethazine  (PHENERGAN ) 25 mg Oral Tablet Take 1 Tablet (25 mg total) by mouth Every 6 hours as needed for Nausea/Vomiting    promethazine  HCl (PROMETHAZINE , BULK,) 100 % Powder     QUEtiapine  (SEROQUEL ) 100 mg Oral Tablet Take 1 Tablet (100 mg total) by mouth Every night for 30 days    rOPINIRole  (REQUIP ) 1 mg Oral Tablet Take 1 Tablet (1 mg total) by mouth Three times a day    traMADoL  (ULTRAM ) 50 mg Oral Tablet TAKE TWO  TABLETS BY MOUTH EVERY EIGHT HOURS       Allergies[1]      PHYSICAL EXAM:  BP 131/81 (Site: Right Arm, Patient Position: Sitting, Cuff Size: Adult)   Pulse 88   Temp 36.3 C (97.3 F) (Temporal)   Ht 1.499 m (4' 11)   Wt 82.9 kg (182 lb 12.8 oz)   SpO2 99%   BMI 36.92 kg/m        ECOG Status: (2) Ambulatory and capable of self care, unable to carry out work activity, up and about > 50% or waking hours   Physical Exam  MEASUREMENTS: Weight- 182.  GENERAL: Alert, cooperative, well developed, no acute distress.  HEENT: Normocephalic, normal oropharynx, moist mucous membranes.  CHEST: Clear to auscultation  bilaterally, no wheezes, rhonchi, or crackles. Lungs normal.  CARDIOVASCULAR: Normal heart rate and rhythm, S1 and S2 normal without murmurs.  ABDOMEN: Soft, non-tender, non-distended, without organomegaly, normal bowel sounds.  EXTREMITIES: No cyanosis or edema.  NEUROLOGICAL: Cranial nerves grossly intact, moves all extremities without gross motor or sensory deficit.        LABS:   Results  LABS  Hemoglobin: 9.9 g/dL (98/86/7973)  MCV: 80 fL (03/31/2024)  Magnesium : low (03/31/2024)  Phosphorus: low (03/31/2024)  Potassium: 3.4 mmol/L (03/31/2024)  Sodium: 147 mmol/L (03/31/2024)  Calcium: 7.6 mg/dL (98/86/7973)  Albumin : 2.6 g/dL (98/86/7973)    I corrected her calcium from her visit to ER as no correction was complete.  With her albumin  low calcium correction is needed.   Corrected Calcium 8.3       ASSESSMENT:    ICD-10-CM    1. Iron  deficiency anemia secondary to inadequate dietary iron  intake  D50.8 IONIZED CALCIUM WITH PH     IRON  TRANSFERRIN AND TIBC     FERRITIN      2. Hypophosphatemia  E83.39 IONIZED CALCIUM WITH PH     IRON  TRANSFERRIN AND TIBC     FERRITIN      3. Hypomagnesemia  E83.42 IONIZED CALCIUM WITH PH     IRON  TRANSFERRIN AND TIBC     FERRITIN      4. Hypocalcemia  E83.51 IONIZED CALCIUM WITH PH     IRON  TRANSFERRIN AND TIBC     FERRITIN      5. Elevated PTHrP level  R79.89 PARATHYROID  HORMONE (PTH)      6. Hypokalemia  E87.6 POTASSIUM           Assessment & Plan  Protein-calorie malnutrition  Persistent protein-calorie malnutrition with hypoalbuminemia and ongoing nutritional deficits due to impaired absorption following total gastrectomy. Despite weight gain, she lacks adequate macronutrient intake, with chronic deficits exacerbated by inability to maintain oral intake and frequent hospitalizations for repletion. Enteral nutrition via J tube is strongly recommended to address the underlying cause, acknowledging infection risk. Without improved nutritional support, only transient correction  of deficiencies is possible.  - Ordered albumin   25 gm over 30 minutes due to swelling and low albumin  with malabsorption  - Administered 500 mL IV fluid.  - Discussed need for J tube placement for long-term enteral nutrition, including infection risk and rationale.  - Reviewed nutritional status and rationale for enteral support.    Iron  deficiency anemia  Recurrent iron  deficiency anemia with hemoglobin decreased to 9.9 g/dL (previously in the 88d post-iron  therapy). MCV remains in the 80s, consistent with ongoing iron  deficiency, less severe than prior episodes. She requires frequent iron  infusions post-gastrectomy and will likely need further supplementation.  - Ordered iron  panel  and related labs.  - Will review iron  studies and determine need for further iron  supplementation.  - Discussed need for reauthorization for iron  infusions if indicated.    Electrolyte disturbances: hypokalemia, hypomagnesemia, hypophosphatemia, hypocalcemia  Severe hypokalemia, hypomagnesemia, hypophosphatemia, and hypocalcemia secondary to malabsorption post-gastrectomy. She remains at high risk for recurrent disturbances due to poor oral intake and fluctuating levels. Oral potassium is not tolerated due to emesis and dysphagia. Correction of calcium for hypoalbuminemia was discussed; ongoing monitoring is required.  - Ordered repeat potassium and ionized calcium levels. Ionized Calcium was 1.20 (n)  - Administered magnesium  as indicated.  - Adjusted fluid administration to avoid exacerbating electrolyte abnormalities.  - Will monitor and supplement electrolytes as indicated by laboratory results.    Peripheral neuropathy  Severe, painful, and numb neuropathic symptoms in the lower extremities, likely secondary to nutritional and electrolyte deficiencies. Symptoms persist despite interventions and significantly impair quality of life.  - Continue symptomatic management.  - Maintain electrolyte repletion to minimize neuropathic  symptoms.  - Continue antiemetic therapy (Compazine ) for associated symptoms.    History of total gastrectomy  Total gastrectomy is the underlying etiology for malnutrition and electrolyte disturbances due to impaired digestion and nutrient absorption. J tube placement is under consideration for long-term nutritional support. Port access has been challenging but without infection or malfunction requiring replacement.  - Discussed need for J tube placement for improved nutrition, including risks and rationale.  - Encouraged follow-up with [facility] for multidisciplinary evaluation.      Return in about 4 weeks (around 04/29/2024).    Martha White was given the chance to ask questions, and these were answered to their satisfaction. The patient is welcome to call with any questions or concerns in the meantime.     This note was created using [employer] M*Modal Fluency Align mobile application via conversational audio. Consent for audio recording was obtained by patient/family members prior to recording.      Martha Clara APRN, FNP-BC, AOCNP, 04/01/2024 , 09:28       This note was partially generated using MModal Fluency Direct system, and there may be some incorrect words, spellings, and punctuation that were not noted in checking the note before saving.   You can see your note(s) in [facility]. It is common for you to encounter certain medical terminology which may be unfamiliar to you. You might see results before your provider does so please give at least 2 business days for review. Please have this understanding, that NOT all abnormal results are significant. Our office will contact you for any urgent or emergent action if necessary. If you have any questions or concerns, feel free to send a MyChart message or call the office. Please call with any new or concerning symptoms.     CC:  Eleanor JONELLE Lee, FNP  [address]  [place] [address]                           [1]   Allergies  Allergen  Reactions    Alcohol  Shortness of Breath     Etoh, not rubbing alcohol       Sulfa (Sulfonamides) Anaphylaxis    Ibuprofen     Influenza Virus Vaccines     Influenza Virus Vaccine, Specific Hives/ Urticaria    Penicillins Hives/ Urticaria    Pneumococcal Vaccine Hives/ Urticaria

## 2024-04-02 ENCOUNTER — Ambulatory Visit (HOSPITAL_COMMUNITY): Payer: Self-pay

## 2024-04-02 ENCOUNTER — Other Ambulatory Visit (INDEPENDENT_AMBULATORY_CARE_PROVIDER_SITE_OTHER): Payer: Self-pay | Admitting: NURSE PRACTITIONER

## 2024-04-02 ENCOUNTER — Other Ambulatory Visit (HOSPITAL_PSYCHIATRIC): Payer: Self-pay

## 2024-04-02 ENCOUNTER — Encounter (HOSPITAL_COMMUNITY): Payer: Self-pay | Admitting: NURSE PRACTITIONER

## 2024-04-02 NOTE — Telephone Encounter (Signed)
 Pt needs 90 supply on her refills. She has an appt with you 04/07/24. I have built the prescriptions for you to approve or deny.

## 2024-04-03 ENCOUNTER — Encounter (INDEPENDENT_AMBULATORY_CARE_PROVIDER_SITE_OTHER): Payer: Self-pay

## 2024-04-05 ENCOUNTER — Emergency Department
Admission: EM | Admit: 2024-04-05 | Discharge: 2024-04-05 | Disposition: A | Discharge status: Home / Self Care | Attending: Family | Admitting: Family

## 2024-04-05 ENCOUNTER — Emergency Department (HOSPITAL_BASED_OUTPATIENT_CLINIC_OR_DEPARTMENT_OTHER)

## 2024-04-05 ENCOUNTER — Encounter (HOSPITAL_BASED_OUTPATIENT_CLINIC_OR_DEPARTMENT_OTHER): Payer: Self-pay

## 2024-04-05 ENCOUNTER — Other Ambulatory Visit: Payer: Self-pay

## 2024-04-05 DIAGNOSIS — N3289 Other specified disorders of bladder: Secondary | ICD-10-CM | POA: Insufficient documentation

## 2024-04-05 DIAGNOSIS — R1012 Left upper quadrant pain: Secondary | ICD-10-CM | POA: Insufficient documentation

## 2024-04-05 DIAGNOSIS — J189 Pneumonia, unspecified organism: Secondary | ICD-10-CM | POA: Insufficient documentation

## 2024-04-05 DIAGNOSIS — R195 Other fecal abnormalities: Secondary | ICD-10-CM

## 2024-04-05 DIAGNOSIS — R933 Abnormal findings on diagnostic imaging of other parts of digestive tract: Secondary | ICD-10-CM

## 2024-04-05 DIAGNOSIS — R918 Other nonspecific abnormal finding of lung field: Secondary | ICD-10-CM

## 2024-04-05 DIAGNOSIS — R9431 Abnormal electrocardiogram [ECG] [EKG]: Secondary | ICD-10-CM | POA: Insufficient documentation

## 2024-04-05 LAB — COMPREHENSIVE METABOLIC PANEL, NON-FASTING
ALBUMIN/GLOBULIN RATIO: 1 (ref 0.8–1.4)
ALBUMIN: 3.2 g/dL — ABNORMAL LOW (ref 3.4–5.0)
ALKALINE PHOSPHATASE: 127 U/L — ABNORMAL HIGH (ref 46–116)
ALT (SGPT): 187 U/L — ABNORMAL HIGH (ref <=78)
ANION GAP: 11 mmol/L (ref 4–13)
AST (SGOT): 92 U/L — ABNORMAL HIGH (ref 15–37)
BILIRUBIN TOTAL: 0.3 mg/dL (ref 0.2–1.0)
BUN/CREA RATIO: 13
BUN: 17 mg/dL (ref 7–18)
CALCIUM, CORRECTED: 9 mg/dL
CALCIUM: 8.4 mg/dL — ABNORMAL LOW (ref 8.5–10.1)
CHLORIDE: 108 mmol/L — ABNORMAL HIGH (ref 98–107)
CO2 TOTAL: 24 mmol/L (ref 21–32)
CREATININE: 1.29 mg/dL — ABNORMAL HIGH (ref 0.55–1.02)
ESTIMATED GFR: 48 mL/min/1.73mˆ2 — ABNORMAL LOW (ref >59)
GLOBULIN: 3.3
GLUCOSE: 88 mg/dL (ref 74–106)
OSMOLALITY, CALCULATED: 286 mosm/kg (ref 270–290)
POTASSIUM: 3.8 mmol/L (ref 3.5–5.1)
PROTEIN TOTAL: 6.5 g/dL (ref 6.4–8.2)
SODIUM: 143 mmol/L (ref 136–145)

## 2024-04-05 LAB — CBC WITH DIFF
BASOPHIL #: 0.02 x10ˆ3/uL (ref 0.00–0.10)
BASOPHIL %: 0 % (ref 0–1)
EOSINOPHIL #: 0.16 x10ˆ3/uL (ref 0.00–0.50)
EOSINOPHIL %: 4 % (ref 1–7)
HCT: 34.1 % (ref 31.2–41.9)
HGB: 10.6 g/dL — ABNORMAL LOW (ref 10.9–14.3)
LYMPHOCYTE #: 1.1 x10ˆ3/uL (ref 1.10–3.10)
LYMPHOCYTE %: 25 % (ref 16–46)
MCH: 27.7 pg (ref 24.7–32.8)
MCHC: 31.1 g/dL — ABNORMAL LOW (ref 32.3–35.6)
MCV: 89 fL (ref 75.5–95.3)
MONOCYTE #: 0.47 x10ˆ3/uL (ref 0.20–0.90)
MONOCYTE %: 11 % (ref 4–11)
MPV: 8 fL (ref 7.9–10.8)
NEUTROPHIL #: 2.58 x10ˆ3/uL (ref 1.90–8.20)
NEUTROPHIL %: 59 % (ref 43–77)
PLATELETS: 164 x10ˆ3/uL (ref 140–440)
RBC: 3.83 x10ˆ6/uL (ref 3.63–4.92)
RDW: 17.8 % — ABNORMAL HIGH (ref 12.3–17.7)
WBC: 4.3 x10ˆ3/uL (ref 3.8–11.8)

## 2024-04-05 LAB — MAGNESIUM: MAGNESIUM: 1.7 mg/dL — ABNORMAL LOW (ref 1.8–2.4)

## 2024-04-05 LAB — URINALYSIS, MACRO/MICRO
BILIRUBIN: NEGATIVE mg/dL
BLOOD: NEGATIVE mg/dL
GLUCOSE: NEGATIVE mg/dL
KETONES: NEGATIVE mg/dL
LEUKOCYTE ESTERASE: NEGATIVE WBCs/uL
NITRITE: NEGATIVE
PH: 6 (ref 5.0–8.0)
PROTEIN: NEGATIVE mg/dL
SPECIFIC GRAVITY: 1.015 (ref 1.005–1.030)
UROBILINOGEN: 0.2 mg/dL

## 2024-04-05 LAB — LIPASE: LIPASE: 12 U/L — ABNORMAL LOW (ref 15–77)

## 2024-04-05 LAB — TROPONIN-I: TROPONIN I: 2 ng/L (ref <15)

## 2024-04-05 MED ORDER — HYDROCODONE 5 MG-ACETAMINOPHEN 325 MG TABLET: 1.0000 | ORAL_TABLET | Freq: Four times a day (QID) | ORAL | 0 refills | Status: DC | PRN

## 2024-04-05 MED ORDER — MAGNESIUM SULFATE 2 GRAM/50 ML (4 %) IN WATER INTRAVENOUS PIGGYBACK
INJECTION | INTRAVENOUS | Status: AC
  Filled 2024-04-05: qty 50

## 2024-04-05 MED ORDER — BUPRENORPHINE HCL 0.3 MG/ML INJECTION SOLUTION
0.1500 mg | INTRAMUSCULAR | Status: AC
  Administered 2024-04-05: 0.15 mg via INTRAVENOUS

## 2024-04-05 MED ORDER — SODIUM CHLORIDE 0.9% FLUSH BAG - 250 ML: INTRAVENOUS | Status: DC | PRN

## 2024-04-05 MED ORDER — PROCHLORPERAZINE MALEATE 5 MG TABLET: 5.0000 mg | ORAL_TABLET | Freq: Four times a day (QID) | ORAL | 0 refills | Status: DC | PRN

## 2024-04-05 MED ORDER — CEFDINIR 300 MG CAPSULE: 300.0000 mg | ORAL_CAPSULE | Freq: Two times a day (BID) | ORAL | 0 refills | Status: DC

## 2024-04-05 MED ORDER — SODIUM CHLORIDE 0.9 % (FLUSH) INJECTION SYRINGE
3.0000 mL | INJECTION | Freq: Three times a day (TID) | INTRAMUSCULAR | Status: DC
  Administered 2024-04-05: 10 mL

## 2024-04-05 MED ORDER — SODIUM CHLORIDE 0.9 % INTRAVENOUS SOLUTION
2.0000 g | INTRAVENOUS | Status: AC
  Administered 2024-04-05: 2 g via INTRAVENOUS
  Administered 2024-04-05: 0 g via INTRAVENOUS

## 2024-04-05 MED ORDER — SODIUM CHLORIDE 0.9 % (FLUSH) INJECTION SYRINGE: 3.0000 mL | INJECTION | INTRAMUSCULAR | Status: DC | PRN

## 2024-04-05 MED ORDER — PROCHLORPERAZINE EDISYLATE 10 MG/2 ML (5 MG/ML) INJECTION SOLUTION
INTRAMUSCULAR | Status: AC
  Filled 2024-04-05: qty 2

## 2024-04-05 MED ORDER — DOXYCYCLINE HYCLATE 100 MG CAPSULE: 100.0000 mg | ORAL_CAPSULE | Freq: Two times a day (BID) | ORAL | 0 refills | Status: AC

## 2024-04-05 MED ORDER — PROCHLORPERAZINE EDISYLATE 10 MG/2 ML (5 MG/ML) INJECTION SOLUTION
10.0000 mg | INTRAMUSCULAR | Status: AC
  Administered 2024-04-05: 10 mg via INTRAVENOUS

## 2024-04-05 MED ORDER — SODIUM CHLORIDE 0.9 % INTRAVENOUS SOLUTION
INTRAVENOUS | Status: AC
  Filled 2024-04-05: qty 50

## 2024-04-05 MED ORDER — MAGNESIUM SULFATE 2 GRAM/50 ML (4 %) IN WATER INTRAVENOUS PIGGYBACK
2.0000 g | INJECTION | Freq: Once | INTRAVENOUS | Status: AC
  Administered 2024-04-05: 2 g via INTRAVENOUS
  Administered 2024-04-05: 0 g via INTRAVENOUS

## 2024-04-05 MED ORDER — HEPARIN, PORCINE (PF) 100 UNIT/ML INTRAVENOUS SYRINGE
INJECTION | INTRAVENOUS | Status: AC
  Filled 2024-04-05: qty 5

## 2024-04-05 MED ORDER — BUPRENORPHINE HCL 0.3 MG/ML INJECTION SOLUTION
INTRAMUSCULAR | Status: AC
  Filled 2024-04-05: qty 1

## 2024-04-05 MED ORDER — DEXTROSE 5% IN WATER (D5W) FLUSH BAG - 250 ML: INTRAVENOUS | Status: DC | PRN

## 2024-04-05 MED ORDER — FAMOTIDINE 40 MG TABLET: 40.0000 mg | ORAL_TABLET | Freq: Two times a day (BID) | ORAL | 0 refills | Status: AC

## 2024-04-05 MED ORDER — FAMOTIDINE 40 MG TABLET: 40.0000 mg | ORAL_TABLET | Freq: Two times a day (BID) | ORAL | 0 refills | Status: DC

## 2024-04-05 MED ORDER — CEFDINIR 300 MG CAPSULE: 300.0000 mg | ORAL_CAPSULE | Freq: Two times a day (BID) | ORAL | 0 refills | Status: AC

## 2024-04-05 MED ORDER — CEFTRIAXONE 2 GRAM SOLUTION FOR INJECTION
INTRAMUSCULAR | Status: AC
  Filled 2024-04-05: qty 20

## 2024-04-05 MED ORDER — PROCHLORPERAZINE MALEATE 5 MG TABLET: 5.0000 mg | ORAL_TABLET | Freq: Four times a day (QID) | ORAL | 0 refills | Status: AC | PRN

## 2024-04-05 MED ORDER — DOXYCYCLINE HYCLATE 100 MG CAPSULE: 100.0000 mg | ORAL_CAPSULE | Freq: Two times a day (BID) | ORAL | 0 refills | Status: DC

## 2024-04-05 MED ORDER — HYDROCODONE 5 MG-ACETAMINOPHEN 325 MG TABLET: 1.0000 | ORAL_TABLET | Freq: Four times a day (QID) | ORAL | 0 refills | Status: AC | PRN

## 2024-04-05 NOTE — Discharge Instructions (Signed)
 RETURN IF ANY CONCERNS. CLOSE FOLLOW UP.

## 2024-04-05 NOTE — ED Nurses Note (Signed)
 Discharge instructions given to and went over with patient and husband time allowed for questions and discussion. Understanding stated. Ambulated off unit without problem or complaint.

## 2024-04-06 ENCOUNTER — Encounter (HOSPITAL_COMMUNITY): Payer: Self-pay | Admitting: NURSE PRACTITIONER

## 2024-04-06 DIAGNOSIS — R9431 Abnormal electrocardiogram [ECG] [EKG]: Secondary | ICD-10-CM

## 2024-04-06 LAB — ECG 12 LEAD
Atrial Rate: 90 {beats}/min
Calculated P Axis: 54 degrees
Calculated R Axis: -7 degrees
Calculated T Axis: 34 degrees
PR Interval: 160 ms
QRS Duration: 78 ms
QT Interval: 332 ms
QTC Calculation: 406 ms
Ventricular rate: 90 {beats}/min

## 2024-04-07 ENCOUNTER — Other Ambulatory Visit: Payer: Self-pay

## 2024-04-07 ENCOUNTER — Ambulatory Visit: Payer: Self-pay

## 2024-04-07 ENCOUNTER — Encounter (HOSPITAL_PSYCHIATRIC): Payer: Self-pay

## 2024-04-07 ENCOUNTER — Ambulatory Visit

## 2024-04-07 ENCOUNTER — Ambulatory Visit (HOSPITAL_COMMUNITY)

## 2024-04-07 VITALS — Ht 59.0 in | Wt 186.0 lb

## 2024-04-07 DIAGNOSIS — G4700 Insomnia, unspecified: Secondary | ICD-10-CM

## 2024-04-07 DIAGNOSIS — F332 Major depressive disorder, recurrent severe without psychotic features: Secondary | ICD-10-CM

## 2024-04-07 DIAGNOSIS — F32A Depression, unspecified: Secondary | ICD-10-CM

## 2024-04-07 DIAGNOSIS — F411 Generalized anxiety disorder: Secondary | ICD-10-CM

## 2024-04-07 MED ORDER — HYDROXYZINE PAMOATE 25 MG CAPSULE: 25.0000 mg | ORAL_CAPSULE | Freq: Every evening | ORAL | 0 refills | Status: AC

## 2024-04-07 MED ORDER — NALOXONE 4 MG/ACTUATION NASAL SPRAY: 1.0000 | NASAL | 0 refills | Status: AC | PRN

## 2024-04-07 MED ORDER — QUETIAPINE 100 MG TABLET: 100.0000 mg | ORAL_TABLET | Freq: Every evening | ORAL | 0 refills | Status: AC

## 2024-04-07 MED ORDER — ALPRAZOLAM 0.5 MG TABLET: 0.5000 mg | ORAL_TABLET | Freq: Three times a day (TID) | ORAL | 0 refills | Status: AC | PRN

## 2024-04-07 MED ORDER — DULOXETINE 30 MG CAPSULE,DELAYED RELEASE: 30.0000 mg | DELAYED_RELEASE_CAPSULE | Freq: Two times a day (BID) | ORAL | 0 refills | Status: AC

## 2024-04-07 NOTE — Progress Notes (Signed)
 [facility], [facility]  [address]  [place] [address]  Operated by [facility]    Name: Martha White MRN:  [id number]   Date: 04/07/2024 DOB:  04/11/1965 (58 y.o.)       Chief Complaint: Major Depression and Generalized Anxiety    Subjective:   Patient reports that she is sick with pneumonia. Currently on 2 antibiotics. Anxiety is more bothersome.    Has been taking Trazodone  for a while and statrted Seroquel  last month. Seroquel  helps     Mood Real sick  Medications Working ok. No ill effects.  Appetite Able to eat. Throat hurts to swallow due to illness.  Sleep Sleeping a little bit.  Energy None  Stressors Getting a J-tube at the [facility] on 05/01/24.    Problem List[1]  Past Medical History[2]  Past Surgical History[3]  Family History[4]  Social History     Socioeconomic History    Marital status: Married   Tobacco Use    Smoking status: Never    Smokeless tobacco: Never   Vaping Use    Vaping status: Never Used   Substance and Sexual Activity    Alcohol  use: Never    Drug use: Never    Sexual activity: Never     Social Determinants of Health     Social Connections: Low Risk (01/01/2024)    Social Connections     SDOH Social Isolation: 5 or more times a week   Recent Concern: Social Connections - Medium Risk (12/07/2023)    Social Connections     SDOH Social Isolation: 3 to 5 times a week      Alcohol ; Sulfa (sulfonamides); Ibuprofen; Influenza virus vaccines; Influenza virus vaccine, specific; Penicillins; and Pneumococcal vaccine   Current Outpatient Medications   Medication Sig    albuterol  sulfate (PROVENTIL  OR VENTOLIN  OR PROAIR ) 90 mcg/actuation Inhalation oral inhaler Take 1-2 Puffs by inhalation Every 6 hours as needed    amLODIPine  (NORVASC ) 10 mg Oral Tablet Take 1 Tablet (10 mg total) by mouth Daily    atorvastatin  calcium (ATORVASTATIN  ORAL) Take 80 mg by mouth Once a day    bethanechol chloride (URECHOLINE) 5 mg Oral Tablet bethanechol  chloride 5 mg tablet   TAKE ONE TABLET BY MOUTH ONCE EVERY DAY    Capsaicin  (TRIXAICIN HP) 0.075 % Cream Apply topically Four times a day as needed    cefdinir  (OMNICEF ) 300 mg Oral Capsule Take 1 Capsule (300 mg total) by mouth Twice daily    cholecalciferol , vitamin D3, 125 mcg (5,000 unit) Oral Capsule Take 2 Capsules (10,000 Units total) by mouth Daily    clindamycin phosphate (CLEOCIN) 2 % Vaginal Cream USE 1 APPLICATORFUL VAGINALLY AS DIRECTED NIGHTLY AT BEDTIME FOR 7 NIGHTS    clopidogreL  (PLAVIX ) 75 mg Oral Tablet Take 1 Tablet (75 mg total) by mouth Daily    cyanocobalamin (VITAMIN B12) 1,000 mcg/mL Injection Solution Inject 1 mL (1,000 mcg total) under the skin Every 14 days    cyclobenzaprine  (FLEXERIL ) 5 mg Oral Tablet Take 1 Tablet (5 mg total) by mouth Twice daily    divalproex  (DEPAKOTE  ER) 250 mg Oral Tablet Sustained Release 24 hr Take 1 Tablet (250 mg total) by mouth    doxycycline  hyclate (VIBRAMYCIN ) 100 mg Oral Capsule Take 1 Capsule (100 mg total) by mouth Twice daily for 10 days    dulaglutide (TRULICITY) 0.75 mg/0.5 mL Subcutaneous Pen Injector Inject 0.5 mL (0.75 mg total) under the skin Every 7 days  DULoxetine  (CYMBALTA  DR) 30 mg Oral Capsule, Delayed Release(E.C.) Take 1 Capsule (30 mg total) by mouth Twice daily    famotidine  (PEPCID ) 20 mg Oral Tablet Take 1 Tablet (20 mg total) by mouth Daily    famotidine  (PEPCID ) 40 mg Oral Tablet Take 1 Tablet (40 mg total) by mouth Twice daily for 7 days    furosemide  (LASIX ) 20 mg Oral Tablet Take 1 Tablet (20 mg total) by mouth Once per day as needed    HYDROcodone -acetaminophen  (NORCO) 5-325 mg Oral Tablet Take 1 Tablet by mouth Every 6 hours as needed for Pain    hydrOXYzine  pamoate (VISTARIL ) 25 mg Oral Capsule Take 1 Capsule (25 mg total) by mouth Every night for 30 days    insulin  detemir U-100 (LEVEMIR FLEXPEN) 100 unit/mL (3 mL) Subcutaneous Insulin  Pen Inject 60 Units under the skin Once per day as needed (SLIDING SCALE)    insulin   lispro (HUMALOG  KWIKPEN INSULIN  SUBQ) Inject under the skin 7-20 units tid before meals    ipratropium/albuterol  sulfate (COMBIVENT INHL) Take 1 Puff by inhalation Four times a day as needed    lidocaine -prilocaine  (EMLA ) 2.5-2.5 % Cream Apply topically to Port-A-Cath site 30 minutes prior to excessive port.    lisinopriL (PRINIVIL) 20 mg Oral Tablet Take 1 Tablet (20 mg total) by mouth Daily    loratadine  (CLARITIN ) 10 mg Oral Tablet Take 1 Tablet (10 mg total) by mouth Once per day as needed    magnesium  chloride (SLOW-MAG) 64 mg Oral Tablet, Delayed Release (E.C.) Take 1 Tablet (64 mg total) by mouth Daily Slow mag 71.5 mg-119 mg days 2 tablets daily    MetFORMIN (GLUCOPHAGE) 1,000 mg Oral Tablet Take 1 Tablet (1,000 mg total) by mouth Every morning with breakfast    metoclopramide  HCl (REGLAN ) 5 mg/5 mL Oral Solution     metoprolol  succinate (TOPROL -XL) 50 mg Oral Tablet Sustained Release 24 hr Take 1 Tablet (50 mg total) by mouth Daily    nystatin  (MYCOSTATIN ) 100,000 unit/mL Oral Suspension Swish and spit 5 mL Four times a day    omeprazole (PRILOSEC) 40 mg Oral Capsule, Delayed Release(E.C.) Take 1 Capsule (40 mg total) by mouth Daily    pregabalin  (LYRICA ) 75 mg Oral Capsule Take 1 Capsule (75 mg total) by mouth Twice daily    prochlorperazine  (COMPAZINE ) 10 mg Oral Tablet Take 1 Tablet (10 mg total) by mouth Twice per day as needed for Nausea/Vomiting    prochlorperazine  (COMPAZINE ) 5 mg Oral Tablet Take 1 Tablet (5 mg total) by mouth Every 6 hours as needed for Nausea/Vomiting    promethazine  (PHENERGAN ) 25 mg Oral Tablet Take 1 Tablet (25 mg total) by mouth Every 6 hours as needed for Nausea/Vomiting    promethazine  HCl (PROMETHAZINE , BULK,) 100 % Powder     QUEtiapine  (SEROQUEL ) 100 mg Oral Tablet Take 1 Tablet (100 mg total) by mouth Every night for 30 days    rOPINIRole  (REQUIP ) 1 mg Oral Tablet Take 1 Tablet (1 mg total) by mouth Three times a day    traMADoL  (ULTRAM ) 50 mg Oral Tablet TAKE TWO  TABLETS BY MOUTH EVERY EIGHT HOURS        Objective :  Ht 1.499 m (4' 11)   Wt 84.4 kg (186 lb)   BMI 37.57 kg/m       PHQ Total Score  PHQ 2 Total: 6  PHQ 9 Total: 20  Interpretation of Total Score: 20-27 Severe depression  03/10/2024     1:28 PM 04/07/2024    11:38 AM   Most Recent PHQ-9 Scores   PHQ 9 Total 14 20           Mental Status Exam  AXOX4. Casual dress, calm, well-groomed. No SI, HI, AVH, delusions, or paranoia. Thoughts are logical, coherent, and goal-directed. Good eye contact. Speech is normal in rate and tone. Mood is okay affect congruent. No psychomotor agitation or retardation, cogwheel rigidity, or abnormal movements. Gait is normal. Attention, concentration, and memory are good. No cognitive deficits noted. Judgment and insight are fair. Calculation and abstraction are within normal limits.     Data Reviewed  I have reviewed patient's previous note medical, surgical, family, and social history in detail today,     Assessment  Depression, generalized anxiety disorder, and insomnia     Plan  Hydroxyzine  Pamoate 25 mg take 1 cap by mouth nightly as needed for insomnia  Divalproex  ER 250 mg take 1 tab by mouth once daily for migraines (PCP to prescribe)  Duloxetine  30 mg take 1 tab by mouth twice daily anxiety, depression, and fibromyalgia  Quetiapine  100 mg take 1 tab by mouth daily for insomnia  Start Alprazolam  0.5 mg take 1 tab by mouth three times daily as needed for anxiety. 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)    Iron  deficiency anemia    Vomiting    Diarrhea    Acute hypoxic respiratory failure (CMS HCC)    Hypophosphatemia    Dehydration     Insomnia    Generalized anxiety disorder   [2]   Past Medical History:  Diagnosis Date    Arthropathy     Asthma     Carpal tunnel syndrome     Carpal tunnel syndrome     Congestive heart failure     COPD (chronic obstructive pulmonary disease)     Depression     Diabetes mellitus, type 2     Fibromyalgia     Gastroparesis     Headache     HTN (hypertension)     Lumbar herniated disc     Neuropathy (CMS HCC)     Restless legs syndrome (RLS)     Thyroid  disease     TIA (transient ischemic attack)     TIA (transient ischemic attack)    [3]   Past Surgical History:  Procedure Laterality Date    HX CHOLECYSTECTOMY      HX HERNIA REPAIR      HX HERNIA REPAIR      HX HYSTERECTOMY      HX ROTATOR CUFF REPAIR      SHOULDER OPEN ROTATOR CUFF REPAIR Right     STOMACH SURGERY      X4   [4]   Family History  Problem Relation Name Age of Onset    Hypertension (High Blood Pressure) Mother      Hypertension (High Blood Pressure) Maternal Grandmother      Alzheimer's/Dementia Maternal Grandmother  Pancreatic Cancer Maternal Grandmother      Hypertension (High Blood Pressure) Maternal Grandfather      Heart Attack Maternal Grandfather

## 2024-04-07 NOTE — ED Provider Notes (Signed)
 [facility], [place] - Emergency Department  ED Primary Note  History of Present Illness   Martha White is a 59 y.o. female who had concerns including Medical Problem. Luq abd pain nvd weakness. Feels like shocks in side. Issues with nutrient absorption . Denies sob.  Review of Systems   Constitutional: No fever, chills +weakness   Skin: No rash or diaphoresis  HENT: + headaches,  no congestion  Eyes: No vision changes or photophobia   Cardio: No chest pain, palpitations or leg swelling   Respiratory: No cough, wheezing or SOB  GI:  + nausea, vomiting abd pain, stool changes  GU:  No dysuria, hematuria, or increased frequency  MSK: No muscle aches, joint or back pain  Neuro: No seizures, LOC, numbness, tingling, or focal weakness  Psychiatric: No depression, SI or substance abuse  All other systems reviewed and are negative.        Physical Exam   ED Triage Vitals [04/05/24 1112]   BP (Non-Invasive) 133/81   Heart Rate 94   Respiratory Rate (!) 22   Temperature 36 C (96.8 F)   SpO2 100 %   Weight 83.2 kg (183 lb 8 oz)   Height 1.499 m (4' 11)     Constitutional:  59 y.o. female who appears in no distress. Normal color, no cyanosis.   HENT:   Head: Normocephalic and atraumatic.   Mouth/Throat: Oropharynx is clear and moist.   Eyes: EOMI, PERRL   Neck: Trachea midline. Neck supple.  Cardiovascular: RRR, No murmurs, rubs or gallops. Intact distal pulses.  Pulmonary/Chest: BS equal bilaterally. No respiratory distress. No wheezes, rales or chest tenderness.   Abdominal: Bowel sounds present and normal. Abdomen soft, + luq mild  tenderness, no rebound and no guarding.  Back: No midline spinal tenderness, no paraspinal tenderness, no CVA tenderness.           Musculoskeletal: No edema, tenderness or deformity.  Skin: warm and dry. No rash, erythema, pallor or cyanosis  Psychiatric: normal mood and affect. Behavior is normal.   Neurological: Patient keenly alert and responsive, easily able to  raise eyebrows, facial muscles/expressions symmetric, speaking in fluent sentences, moving all extremities equally and fully, normal gait  Patient Data   Labs Ordered/Reviewed   COMPREHENSIVE METABOLIC PANEL, NON-FASTING - Abnormal; Notable for the following components:       Result Value    CHLORIDE 108 (*)     CREATININE 1.29 (*)     ESTIMATED GFR 48 (*)     ALBUMIN  3.2 (*)     CALCIUM 8.4 (*)     ALKALINE PHOSPHATASE 127 (*)     ALT (SGPT) 187 (*)     AST (SGOT) 92 (*)     All other components within normal limits    Narrative:     Estimated Glomerular Filtration Rate (eGFR) is calculated using the CKD-EPI (2021) equation, intended for patients 54 years of age and older. If gender is not documented or unknown, there will be no eGFR calculation.   MAGNESIUM  - Abnormal; Notable for the following components:    MAGNESIUM  1.7 (*)     All other components within normal limits   LIPASE - Abnormal; Notable for the following components:    LIPASE 12 (*)     All other components within normal limits   CBC WITH DIFF - Abnormal; Notable for the following components:    HGB 10.6 (*)     MCHC 31.1 (*)  RDW 17.8 (*)     All other components within normal limits   TROPONIN-I - Normal    Narrative:     Values received on females ranging between 12-15 ng/L MUST include the next serial troponin to review changes in the delta differences as the reference range for the Access II chemistry analyzer is lower than the established reference range.     URINALYSIS, MACRO/MICRO - Normal   URINALYSIS,WITH REFLEX MICROSCOPIC AND REFLEX CULTURE IF POSITIVE    Narrative:     The following orders were created for panel order URINALYSIS,WITH REFLEX MICROSCOPIC AND REFLEX CULTURE IF POSITIVE.  Procedure                               Abnormality         Status                     ---------                               -----------         ------                     URINALYSIS, MACRO/MICRO[[id number]]      Normal              Final result                  Please view results for these tests on the individual orders.   CBC/DIFF    Narrative:     The following orders were created for panel order CBC/DIFF.  Procedure                               Abnormality         Status                     ---------                               -----------         ------                     CBC WITH DIFF[[id number]]                Abnormal            Final result                 Please view results for these tests on the individual orders.     CT ABDOMEN PELVIS WO IV CONTRAST   Final Result by Edi, [facility] In (01/18 1327)   Moderately large amount of gas and stool scattered throughout the length of the colon   Nondistended the sigmoid without sign of volvulus   No perforation   Distended urinary bladder   Small patchy airspace disease bilateral lower lobes               Radiologist location ID: [id number]           Medical Decision Making   Diff dx of pancreatitis gastroenteritis gastritis. Dehydration atypical mi. Pt has  lips of 12 mag 1.7 replaced trop 2 creat 1.29 has appt  with nephro. Neg urine. Wbc 4.3  ct findings patchy pneumonia , mod stool.   Pt treated for pneumonia no sob. States she has had it several times wo classic sx. Urged to have close follow up no sob. Speaking in full sentences no co. No nvd iner.     Medications Ordered/Administered in the ED   prochlorperazine  (COMPAZINE ) 5 mg/mL injection (10 mg Intravenous Given 04/05/24 1217)   buprenorphine  (BUPRENEX ) 0.3 mg/mL injection (0.15 mg Intravenous Given 04/05/24 1228)   magnesium  sulfate 2 G in SW 50 mL premix IVPB (0 g Intravenous Stopped 04/05/24 1428)   cefTRIAXone  (ROCEPHIN ) 2 g in NS 50 mL IVPB with adaptor (0 g Intravenous Stopped 04/05/24 1507)   buprenorphine  (BUPRENEX ) 0.3 mg/mL injection (0.15 mg Intravenous Given 04/05/24 1520)     Clinical Impression   LUQ abdominal pain (Primary)   Hypomagnesemia   Pneumonia       Disposition: Discharged

## 2024-04-07 NOTE — Progress Notes (Signed)
 TELEMEDICINE DOCUMENTATION:    Patient Location:  MyChart video visit from home address: [address]  [place] [address]    Patient/family aware of provider location:  yes  Patient/family consent for telemedicine:  yes  Examination observed and performed by:  Jon Apa, PMHNP-BC

## 2024-04-08 ENCOUNTER — Ambulatory Visit (HOSPITAL_COMMUNITY)

## 2024-04-08 ENCOUNTER — Ambulatory Visit
Admission: RE | Admit: 2024-04-08 | Discharge: 2024-04-08 | Disposition: A | Discharge status: Home / Self Care | Source: Ambulatory Visit | Attending: NURSE PRACTITIONER | Admitting: NURSE PRACTITIONER

## 2024-04-08 DIAGNOSIS — D508 Other iron deficiency anemias: Secondary | ICD-10-CM

## 2024-04-09 ENCOUNTER — Ambulatory Visit (HOSPITAL_COMMUNITY): Payer: Self-pay

## 2024-04-09 ENCOUNTER — Other Ambulatory Visit (INDEPENDENT_AMBULATORY_CARE_PROVIDER_SITE_OTHER): Payer: Self-pay | Admitting: NURSE PRACTITIONER

## 2024-04-09 ENCOUNTER — Emergency Department (HOSPITAL_BASED_OUTPATIENT_CLINIC_OR_DEPARTMENT_OTHER)

## 2024-04-09 ENCOUNTER — Encounter (HOSPITAL_BASED_OUTPATIENT_CLINIC_OR_DEPARTMENT_OTHER): Payer: Self-pay

## 2024-04-09 ENCOUNTER — Other Ambulatory Visit: Payer: Self-pay

## 2024-04-09 ENCOUNTER — Emergency Department: Admission: EM | Admit: 2024-04-09 | Discharge: 2024-04-09 | Disposition: A | Discharge status: Home / Self Care

## 2024-04-09 DIAGNOSIS — Z8673 Personal history of transient ischemic attack (TIA), and cerebral infarction without residual deficits: Secondary | ICD-10-CM

## 2024-04-09 DIAGNOSIS — R7401 Elevation of levels of liver transaminase levels: Secondary | ICD-10-CM | POA: Insufficient documentation

## 2024-04-09 DIAGNOSIS — R32 Unspecified urinary incontinence: Secondary | ICD-10-CM | POA: Insufficient documentation

## 2024-04-09 DIAGNOSIS — J189 Pneumonia, unspecified organism: Secondary | ICD-10-CM | POA: Insufficient documentation

## 2024-04-09 DIAGNOSIS — R519 Headache, unspecified: Secondary | ICD-10-CM | POA: Insufficient documentation

## 2024-04-09 LAB — TROPONIN-I: TROPONIN I: 3 ng/L (ref <15)

## 2024-04-09 LAB — MANUAL DIFFERENTIAL
EOSINOPHIL %: 5 % (ref 0–7)
EOSINOPHIL ABSOLUTE: 0.19 x10ˆ3/uL (ref 0.00–0.80)
EOSINOPHILS MANUAL: 5
LYMPHOCYTE %: 29 % (ref 25–45)
LYMPHOCYTE ABSOLUTE: 1.1 x10ˆ3/uL (ref 1.10–5.00)
LYMPHOCYTES MANUAL: 29
MONOCYTE %: 5 % (ref 0–12)
MONOCYTE ABSOLUTE: 0.19 x10ˆ3/uL (ref 0.00–0.30)
MONOCYTES MANUAL: 5
NEUTROPHIL %: 61 % (ref 40–76)
NEUTROPHIL ABSOLUTE: 2.32 x10ˆ3/uL (ref 1.80–8.40)
NEUTROPHILS MANUAL: 61
PLATELET MORPHOLOGY COMMENT: NORMAL
RBC MORPHOLOGY COMMENT: NORMAL
SCHISTOCYTES: ABSENT
TOTAL CELLS COUNTED [#] IN BLOOD: 100
WBC: 3.8 x10ˆ3/uL

## 2024-04-09 LAB — COMPREHENSIVE METABOLIC PANEL, NON-FASTING
ALBUMIN/GLOBULIN RATIO: 1 (ref 0.8–1.4)
ALBUMIN: 3.3 g/dL — ABNORMAL LOW (ref 3.4–5.0)
ALKALINE PHOSPHATASE: 135 U/L — ABNORMAL HIGH (ref 46–116)
ALT (SGPT): 244 U/L — ABNORMAL HIGH (ref <=78)
ANION GAP: 10 mmol/L (ref 4–13)
AST (SGOT): 93 U/L — ABNORMAL HIGH (ref 15–37)
BILIRUBIN TOTAL: 0.3 mg/dL (ref 0.2–1.0)
BUN/CREA RATIO: 16
BUN: 16 mg/dL (ref 7–18)
CALCIUM, CORRECTED: 9.6 mg/dL
CALCIUM: 9 mg/dL (ref 8.5–10.1)
CHLORIDE: 108 mmol/L — ABNORMAL HIGH (ref 98–107)
CO2 TOTAL: 25 mmol/L (ref 21–32)
CREATININE: 1.01 mg/dL (ref 0.55–1.02)
ESTIMATED GFR: 64 mL/min/1.73mˆ2 (ref >59)
GLOBULIN: 3.3
GLUCOSE: 85 mg/dL (ref 74–106)
OSMOLALITY, CALCULATED: 286 mosm/kg (ref 270–290)
POTASSIUM: 4.1 mmol/L (ref 3.5–5.1)
PROTEIN TOTAL: 6.6 g/dL (ref 6.4–8.2)
SODIUM: 143 mmol/L (ref 136–145)

## 2024-04-09 LAB — COVID-19, FLU A/B, RSV RAPID BY PCR
INFLUENZA VIRUS TYPE A: NOT DETECTED
INFLUENZA VIRUS TYPE B: NOT DETECTED
RESPIRATORY SYNCYTIAL VIRUS (RSV): NOT DETECTED
SARS-CoV-2: NOT DETECTED

## 2024-04-09 LAB — ECG 12 LEAD
Atrial Rate: 84 {beats}/min
Calculated P Axis: 50 degrees
Calculated R Axis: 4 degrees
Calculated T Axis: 36 degrees
PR Interval: 180 ms
QRS Duration: 76 ms
QT Interval: 352 ms
QTC Calculation: 415 ms
Ventricular rate: 84 {beats}/min

## 2024-04-09 LAB — LACTIC ACID LEVEL W/ REFLEX FOR LEVEL >2.0: LACTIC ACID: 0.9 mmol/L (ref 0.4–2.0)

## 2024-04-09 LAB — CBC WITH DIFF
HCT: 32.8 % (ref 31.2–41.9)
HGB: 10.2 g/dL — ABNORMAL LOW (ref 10.9–14.3)
MCH: 28.1 pg (ref 24.7–32.8)
MCHC: 31.2 g/dL — ABNORMAL LOW (ref 32.3–35.6)
MCV: 90 fL (ref 75.5–95.3)
MPV: 7.8 fL — ABNORMAL LOW (ref 7.9–10.8)
PLATELETS: 169 x10ˆ3/uL (ref 140–440)
RBC: 3.64 x10ˆ6/uL (ref 3.63–4.92)
RDW: 18.3 % — ABNORMAL HIGH (ref 12.3–17.7)
WBC: 3.8 x10ˆ3/uL (ref 3.8–11.8)

## 2024-04-09 LAB — URINALYSIS, MACRO/MICRO
BILIRUBIN: NEGATIVE mg/dL
BLOOD: NEGATIVE mg/dL
GLUCOSE: NEGATIVE mg/dL
KETONES: NEGATIVE mg/dL
LEUKOCYTE ESTERASE: NEGATIVE WBCs/uL
NITRITE: NEGATIVE
PH: 6 (ref 5.0–8.0)
PROTEIN: NEGATIVE mg/dL
SPECIFIC GRAVITY: 1.02 (ref 1.005–1.030)
UROBILINOGEN: 0.2 mg/dL

## 2024-04-09 LAB — NT-PROBNP: NT-PROBNP: 26 pg/mL (ref <=125)

## 2024-04-09 LAB — MAGNESIUM: MAGNESIUM: 1.9 mg/dL (ref 1.8–2.4)

## 2024-04-09 MED ORDER — PROCHLORPERAZINE EDISYLATE 10 MG/2 ML (5 MG/ML) INJECTION SOLUTION
INTRAMUSCULAR | Status: AC
  Filled 2024-04-09: qty 2

## 2024-04-09 MED ORDER — ACETAMINOPHEN 1,000 MG/100 ML (10 MG/ML) INTRAVENOUS SOLUTION
1000.0000 mg | Freq: Four times a day (QID) | INTRAVENOUS | Status: DC | PRN
  Administered 2024-04-09: 0 mg via INTRAVENOUS
  Administered 2024-04-09: 1000 mg via INTRAVENOUS

## 2024-04-09 MED ORDER — ACETAMINOPHEN 1,000 MG/100 ML (10 MG/ML) INTRAVENOUS SOLUTION
INTRAVENOUS | Status: AC
  Filled 2024-04-09: qty 100

## 2024-04-09 MED ORDER — DEXAMETHASONE SODIUM PHOSPHATE (PF) 10 MG/ML INJECTION SOLUTION
INTRAMUSCULAR | Status: AC
  Filled 2024-04-09: qty 1

## 2024-04-09 MED ORDER — PROCHLORPERAZINE EDISYLATE 10 MG/2 ML (5 MG/ML) INJECTION SOLUTION
10.0000 mg | INTRAMUSCULAR | Status: AC
  Administered 2024-04-09: 10 mg via INTRAVENOUS

## 2024-04-09 MED ORDER — HEPARIN, PORCINE (PF) 100 UNIT/ML INTRAVENOUS SYRINGE
5.0000 mL | INJECTION | Freq: Once | INTRAVENOUS | Status: AC
  Administered 2024-04-09: 5 mL

## 2024-04-09 MED ORDER — HEPARIN, PORCINE (PF) 100 UNIT/ML INTRAVENOUS SYRINGE
INJECTION | INTRAVENOUS | Status: AC
  Filled 2024-04-09: qty 5

## 2024-04-09 MED ORDER — DEXAMETHASONE SODIUM PHOSPHATE (PF) 10 MG/ML INJECTION SOLUTION
10.0000 mg | INTRAMUSCULAR | Status: AC
  Administered 2024-04-09: 10 mg via INTRAVENOUS

## 2024-04-09 NOTE — ED Provider Notes (Signed)
 [facility], [place] - Emergency Department  ED Physician Note      Arrival: Car      Chief Complaint       Chief Complaint   Patient presents with    Flu Like Symptoms     States was here Tuesday and dx with pneumonia. States not feeling any better, feels like someone is taking a radio broadcast assistant to my head.        HPI:   This is a 59 year old female presenting to the emergency department with severe headache, and ongoing malaise.  Patient endorses chest pain and sob but states these are always there.  Patient states that she was evaluated on Tuesday here in the emergency department for pain under her left breast and was diagnosed with pneumonia patient is now having pain under her left and right breast. Patient states she has been taking the antibiotics as prescribed.  Patient endorses worsening headache over the last 24 hours.  Headache is described as pounding and is located in bilateral frontal and temporal areas.      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  Patient is following with the cancer center for infusions of these elements weekly.  Chart review shows that patient is scheduled for a J-tube to be placed at [facility] February 2026.           Ventura Leggitt is a 59 y.o. female who presents to the ED today for: had concerns including Flu Like Symptoms.        ROS:     As per the HPI. (See HPI)    PMH/PSH/FH/SH:       Past Medical History:   Diagnosis Date    Arthropathy     Asthma     Carpal tunnel syndrome     Carpal tunnel syndrome     Congestive heart failure     COPD (chronic obstructive pulmonary disease)     Depression     Diabetes mellitus, type 2     Fibromyalgia     Gastroparesis     Headache     HTN (hypertension)     Lumbar herniated disc     Neuropathy (CMS HCC)     Restless legs syndrome (RLS)     Thyroid  disease     TIA (transient  ischemic attack)     TIA (transient ischemic attack)      Problem List[1]     Past Surgical History:   Procedure Laterality Date    Hx cholecystectomy      Hx hernia repair      Hx hernia repair      Hx hysterectomy      Hx rotator cuff repair      Shoulder open rotator cuff repair Right     Stomach surgery         Family History   Problem Relation Age of Onset    Hypertension (High Blood Pressure) Mother     Hypertension (High Blood Pressure) Maternal Grandmother     Alzheimer's/Dementia Maternal Grandmother     Pancreatic Cancer Maternal Grandmother     Hypertension (High Blood Pressure) Maternal Grandfather     Heart Attack Maternal Grandfather       reports that she has never smoked. She has never used smokeless tobacco. She reports that she does not drink alcohol , does not use drugs, and does not engage in sexual  activity.    Meds/Allergies:       Current Outpatient Medications   Medication Instructions    albuterol  sulfate (PROVENTIL  OR VENTOLIN  OR PROAIR ) 90 mcg/actuation Inhalation oral inhaler 1-2 Puffs, EVERY 6 HOURS PRN    ALPRAZolam  (XANAX ) 0.5 mg, Oral, EVERY 8 HOURS PRN    amLODIPine  (NORVASC ) 10 mg, Daily    atorvastatin  calcium (ATORVASTATIN  ORAL) 80 mg, Daily    bethanechol chloride (URECHOLINE) 5 mg Oral Tablet bethanechol chloride 5 mg tablet   TAKE ONE TABLET BY MOUTH ONCE EVERY DAY    Capsaicin  (TRIXAICIN HP) 0.075 % Cream Apply Topically, 4 TIMES DAILY PRN    cefdinir  (OMNICEF ) 300 mg, Oral, 2 TIMES DAILY    cholecalciferol  (vitamin D3) 10,000 Units, Oral, Daily    clindamycin phosphate (CLEOCIN) 2 % Vaginal Cream USE 1 APPLICATORFUL VAGINALLY AS DIRECTED NIGHTLY AT BEDTIME FOR 7 NIGHTS    clopidogrel  (PLAVIX ) 75 mg, Daily    cyanocobalamin (VITAMIN B12) 1,000 mcg/mL Injection Solution Inject 1 mL (1,000 mcg total) under the skin Every 14 days    cyclobenzaprine  (FLEXERIL ) 5 mg, 2 TIMES DAILY    divalproex  (DEPAKOTE  ER) 250 mg    doxycycline  hyclate (VIBRAMYCIN ) 100 mg, Oral, 2 TIMES DAILY     DULoxetine  (CYMBALTA  DR) 30 mg, Oral, 2 TIMES DAILY    famotidine  (PEPCID ) 20 mg, Daily    famotidine  (PEPCID ) 40 mg, Oral, 2 TIMES DAILY    furosemide  (LASIX ) 20 mg, DAILY PRN    HYDROcodone -acetaminophen  (NORCO) 5-325 mg Oral Tablet 1 Tablet, Oral, EVERY 6 HOURS PRN    hydrOXYzine  pamoate (VISTARIL ) 25 mg, Oral, NIGHTLY    insulin  lispro (HUMALOG  KWIKPEN INSULIN  SUBQ) Inject under the skin 7-20 units tid before meals    ipratropium/albuterol  sulfate (COMBIVENT INHL) 1 Puff, 4 TIMES DAILY PRN    Levemir FlexPen 60 Units, DAILY PRN    lidocaine -prilocaine  (EMLA ) 2.5-2.5 % Cream Apply topically to Port-A-Cath site 30 minutes prior to excessive port.    lisinopril (PRINIVIL) 20 mg, Daily    loratadine  (CLARITIN ) 10 mg, DAILY PRN    magnesium  chloride (SLOW-MAG) 64 mg, Daily    MetFORMIN (GLUCOPHAGE) 1,000 mg, EVERY MORNING WITH BREAKFAST    metoclopramide  HCl (REGLAN ) 5 mg/5 mL Oral Solution     metoprolol  succinate (TOPROL -XL) 50 mg, Daily    naloxone  (NARCAN ) 4 mg per spray nasal spray 1 Spray, INTRANASAL, EVERY 2 MIN PRN, Call 911 if given.    nystatin  (MYCOSTATIN ) 100,000 unit/mL Oral Suspension 5 mL, Swish & Spit, 4 TIMES DAILY    omeprazole (PRILOSEC) 40 mg, Daily    pregabalin  (LYRICA ) 75 mg, 2 TIMES DAILY    prochlorperazine  (COMPAZINE ) 10 mg, 2 TIMES DAILY PRN    prochlorperazine  (COMPAZINE ) 5 mg, Oral, EVERY 6 HOURS PRN    promethazine  (PHENERGAN ) 25 mg, EVERY 6 HOURS PRN    promethazine  HCl (PROMETHAZINE , BULK,) 100 % Powder     QUEtiapine  (SEROQUEL ) 100 mg, Oral, NIGHTLY    rOPINIRole  (REQUIP ) 1 mg, 3 TIMES DAILY    traMADoL  (ULTRAM ) 50 mg Oral Tablet TAKE TWO TABLETS BY MOUTH EVERY EIGHT HOURS    Trulicity 0.75 mg, EVERY 7 DAYS      Allergies[2]    Physical Exam:       ED Triage Vitals [04/09/24 1736]   BP (Non-Invasive) 127/87   Heart Rate 85   Respiratory Rate 20   Temperature 36.6 C (97.9 F)   SpO2 100 %   Weight 84.4 kg (186 lb)  Height 1.499 m (4' 11)     Physical Exam  Constitutional:        Appearance: Normal appearance.   HENT:      Head: Normocephalic.      Mouth/Throat:      Pharynx: Oropharynx is clear.   Eyes:      General: No scleral icterus.     Conjunctiva/sclera: Conjunctivae normal.   Cardiovascular:      Rate and Rhythm: Normal rate and regular rhythm.      Heart sounds: Normal heart sounds.      Comments: Normal s1 and s2.  Peripheral edema bilaterally appears to be equal   Pulmonary:      Comments: Mild conversational dyspnea.  No tachypnea.  Decreased lung sounds in bilateral bases.  No wheezing rhonchi or rales appreciated.   Skin:     General: Skin is warm and dry.   Neurological:      General: No focal deficit present.      Mental Status: She is alert and oriented to person, place, and time.      Gait: Gait normal.          Results:       Labs Ordered/Reviewed   COMPREHENSIVE METABOLIC PANEL, NON-FASTING - Abnormal; Notable for the following components:       Result Value    CHLORIDE 108 (*)     ALBUMIN  3.3 (*)     ALKALINE PHOSPHATASE 135 (*)     ALT (SGPT) 244 (*)     AST (SGOT) 93 (*)     All other components within normal limits    Narrative:     Estimated Glomerular Filtration Rate (eGFR) is calculated using the CKD-EPI (2021) equation, intended for patients 42 years of age and older. If gender is not documented or unknown, there will be no eGFR calculation.   CBC WITH DIFF - Abnormal; Notable for the following components:    HGB 10.2 (*)     MCHC 31.2 (*)     RDW 18.3 (*)     MPV 7.8 (*)     All other components within normal limits   LACTIC ACID LEVEL W/ REFLEX FOR LEVEL >2.0 - Normal   COVID-19, FLU A/B, RSV RAPID BY PCR - Normal    Narrative:     Results are for the simultaneous qualitative identification of SARS-CoV-2 (formerly 2019-nCoV), Influenza A, Influenza B, and RSV RNA. These etiologic agents are generally detectable in nasopharyngeal and nasal swabs during the ACUTE PHASE of infection. Hence, this test is intended to be performed on respiratory specimens collected  from individuals with signs and symptoms of upper respiratory tract infection who meet Centers for Disease Control and Prevention (CDC) clinical and/or epidemiological criteria for Coronavirus Disease 2019 (COVID-19) testing. CDC COVID-19 criteria for testing on human specimens is available at [facility] webpage information for Healthcare Professionals: Coronavirus Disease 2019 (COVID-19) ([email or web address]).     False-negative results may occur if the virus has genomic mutations, insertions, deletions, or rearrangements or if performed very early in the course of illness. Otherwise, negative results indicate virus specific RNA targets are not detected, however negative results do not preclude SARS-CoV-2 infection/COVID-19, Influenza, or Respiratory syncytial virus infection. Results should not be used as the sole basis for patient management decisions. Negative results must be combined with clinical observations, patient history, and epidemiological information. If upper respiratory tract infection is still suspected based on exposure history together with other clinical findings, re-testing should be considered.  Test methodology:   Cepheid Xpert Xpress SARS-CoV-2/Flu/RSV Assay real-time polymerase chain reaction (RT-PCR) test on the GeneXpert Dx and Xpert Xpress systems.   MAGNESIUM  - Normal   TROPONIN-I - Normal    Narrative:     Values received on females ranging between 12-15 ng/L MUST include the next serial troponin to review changes in the delta differences as the reference range for the Access II chemistry analyzer is lower than the established reference range.     NT-PROBNP - Normal   CBC/DIFF    Narrative:     The following orders were created for panel order CBC/DIFF.  Procedure                               Abnormality         Status                     ---------                               -----------         ------                     CBC WITH IPQQ[[id number]]                 Abnormal            Final result               MANUAL DIFFERENTIAL[[id number]]                              Final result                 Please view results for these tests on the individual orders.   MANUAL DIFFERENTIAL   URINALYSIS,WITH REFLEX MICROSCOPIC AND REFLEX CULTURE IF POSITIVE    Narrative:     The following orders were created for panel order URINALYSIS,WITH REFLEX MICROSCOPIC AND REFLEX CULTURE IF POSITIVE.  Procedure                               Abnormality         Status                     ---------                               -----------         ------                     URINALYSIS, MACRO/MICRO[[id number]]                                                       Please view results for these tests on the individual orders.   URINALYSIS, MACRO/MICRO       CT BRAIN WO IV CONTRAST   Final Result by Edi, Radresults In (01/22 1846)   NO ACUTE FINDINGS.  Radiologist location ID: [id number]         XR AP MOBILE CHEST   Final Result by Edi, Radresults In (01/22 1804)   NO ACUTE FINDINGS.            Radiologist location ID: [id number]             ED Course/MDM:     Meds Given:  Medications Ordered/Administered in the ED   acetaminophen  (OFIRMEV ) 1,000 mg (10 mg/mL) IV 100 mL (tot vol) (0 mg Intravenous Stopped 04/09/24 1915)   prochlorperazine  (COMPAZINE ) 5 mg/mL injection (10 mg Intravenous Given 04/09/24 1815)   dexAMETHasone  (PF) 10 mg/mL injection (10 mg Intravenous Given 04/09/24 1815)       Medical Decision Making  Patient presenting to the emergency department with a headache worse over the last 24 hours, currently being treated for pneumonia with doxycycline  and cefdinir .  Chest x-ray does not show any acute abnormalities, no leukocytosis, lactic acid within normal limits.  Patient has had frequent readmissions for low potassium and low magnesium  status post gastrectomy however today her magnesium  potassium are within normal limits.  Head CT does not show any acute  abnormalities.  Patient did express to me that she has had some increased urinary incontinence over the last week, but denies any burning with urination.  I did encourage patient to give a urine analysis sample tonight given her symptoms however she does not wish to wait on the results.  Patient states she is feeling better and her headache has improved with IV Tylenol , Compazine , Decadron .  If abnormal we will reflects for culture.  Note that patient currently is on cefdinir .  Note that patient also has a follow up appointment through primary care provider tomorrow.  Patient given strict return precautions for new concerns or worsening symptoms.     Amount and/or Complexity of Data Reviewed  Labs: ordered.  Radiology: ordered. Decision-making details documented in ED Course.  ECG/medicine tests: ordered and independent interpretation performed.    Risk  Prescription drug management.      ED Course as of 04/09/24 2029   Thu Apr 09, 2024   1853 MAGNESIUM : 1.9  Improved   1856 ALT (SGPT)(!): 244  ALT trending up but has been elevated like this in in the recent past.  Hepatitis-C negative in November 2025.  CT imaging of the abdomen and pelvis on 04/05/2024 shows unremarkable liver.    1918 AST (SGOT)(!): 93  Stable from 01/18.    1918 POTASSIUM: 4.1  Within normal limits   1919 WBC: 3.8  Within normal limits   1919 LACTIC ACID: 0.9  Within normal limits   1920 XR AP MOBILE CHEST  No evidence of current pneumonia.  We will recommend that she finishes current antibiotic course.    1920 CT BRAIN WO IV CONTRAST  No acute findings.          Impressions:       Diagnosis       Diagnosis Comment Added By Time Added    Headache  Cleotilde Vanice Bolt, PA-C 04/09/2024  8:26 PM             Disposition(or Handoff):     Discharged    Ayden Apodaca Bolt Cleotilde, PA-C  04/09/2024 20:29      Part of this note may have been generated using voice recognition software.  Be advised, it is possible that the generated note may be prone to syntax and  other dictation software errors.           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

## 2024-04-09 NOTE — Discharge Instructions (Addendum)
 Follow up with your primary care provider tomorrow as scheduled.   Return to the emergency department as needed for any new concerns or worsening symptoms.  Continue current antibiotic regimen as prescribed.

## 2024-04-09 NOTE — ED Nurses Note (Signed)
 Nurse in to assess patient. Patient resting in ER stretcher on cell phone. Patient denies any needs at this time. Call bell within reach. Patient states she is feeling some better, headache has decreased some.

## 2024-04-09 NOTE — ED Nurses Note (Addendum)
Port heplocked and deaccessed at this time.

## 2024-04-09 NOTE — ED Nurses Note (Signed)
 Patient discharged home with family.  AVS reviewed with patient/care giver.  A written copy of the AVS and discharge instructions was given to the patient/care giver. Scripts handed to patient/care giver. Questions sufficiently answered as needed.  Patient/care giver encouraged to follow up with PCP as indicated.  In the event of an emergency, patient/care giver instructed to call 911 or go to the nearest emergency room.

## 2024-04-09 NOTE — ED Nurses Note (Signed)
 Patient reports she was here on Tuesday and was diagnosed with pneumonia. Patient reports she has been taking her antibiotics and does not feel any better. Patient reports shortness of breath, headache, and body aches.

## 2024-04-10 ENCOUNTER — Ambulatory Visit
Admission: RE | Admit: 2024-04-10 | Discharge: 2024-04-10 | Disposition: A | Discharge status: Home / Self Care | Source: Ambulatory Visit | Attending: NURSE PRACTITIONER | Admitting: NURSE PRACTITIONER

## 2024-04-10 VITALS — BP 150/91 | HR 89 | Temp 97.7°F | Resp 18

## 2024-04-10 DIAGNOSIS — Z789 Other specified health status: Secondary | ICD-10-CM | POA: Insufficient documentation

## 2024-04-10 DIAGNOSIS — D72819 Decreased white blood cell count, unspecified: Secondary | ICD-10-CM | POA: Insufficient documentation

## 2024-04-10 DIAGNOSIS — D508 Other iron deficiency anemias: Secondary | ICD-10-CM | POA: Insufficient documentation

## 2024-04-10 MED ORDER — SODIUM CHLORIDE 0.9 % INTRAVENOUS SOLUTION
20.0000 mmol | Freq: Once | INTRAVENOUS | Status: AC
  Administered 2024-04-10: 20 mmol via INTRAVENOUS
  Administered 2024-04-10: 0 mmol via INTRAVENOUS
  Filled 2024-04-10: qty 6.67

## 2024-04-10 MED ORDER — PROCHLORPERAZINE EDISYLATE 10 MG/2 ML (5 MG/ML) INJECTION SOLUTION
10.0000 mg | Freq: Once | INTRAMUSCULAR | Status: AC
  Administered 2024-04-10: 10 mg via INTRAVENOUS
  Filled 2024-04-10: qty 2

## 2024-04-10 NOTE — Nurses Notes (Signed)
 Summary- Patient arrived and [facility] right chest accessed using sterile tech. Lab drawn via PAC. Potassium phosphorus infused per order. Patient tolerated well. No s/s of distress noted. PAC deaccessed after 20 ml ns flush and band aid applied. Patient ambulated out to desk.Lura Budge, LPN

## 2024-04-14 ENCOUNTER — Encounter (HOSPITAL_COMMUNITY): Payer: Self-pay | Admitting: NURSE PRACTITIONER

## 2024-04-14 ENCOUNTER — Other Ambulatory Visit (INDEPENDENT_AMBULATORY_CARE_PROVIDER_SITE_OTHER): Payer: Self-pay | Admitting: NURSE PRACTITIONER

## 2024-04-15 ENCOUNTER — Other Ambulatory Visit: Payer: Self-pay

## 2024-04-15 ENCOUNTER — Ambulatory Visit
Admission: RE | Admit: 2024-04-15 | Discharge: 2024-04-15 | Disposition: A | Discharge status: Home / Self Care | Payer: Self-pay | Source: Ambulatory Visit | Attending: NURSE PRACTITIONER | Admitting: NURSE PRACTITIONER

## 2024-04-15 VITALS — BP 118/76 | HR 100 | Temp 97.1°F | Resp 18

## 2024-04-15 DIAGNOSIS — D508 Other iron deficiency anemias: Secondary | ICD-10-CM

## 2024-04-15 LAB — PHOSPHORUS: PHOSPHORUS: 3.2 mg/dL — ABNORMAL LOW (ref 3.7–7.2)

## 2024-04-15 MED ORDER — SODIUM CHLORIDE 0.9 % INTRAVENOUS SOLUTION
20.0000 mmol | Freq: Once | INTRAVENOUS | Status: AC
  Administered 2024-04-15: 20 mmol via INTRAVENOUS
  Administered 2024-04-15: 0 mmol via INTRAVENOUS
  Filled 2024-04-15: qty 6.67

## 2024-04-15 MED ORDER — PROCHLORPERAZINE EDISYLATE 10 MG/2 ML (5 MG/ML) INJECTION SOLUTION
10.0000 mg | Freq: Once | INTRAMUSCULAR | Status: AC
  Administered 2024-04-15: 10 mg via INTRAVENOUS
  Filled 2024-04-15: qty 2

## 2024-04-15 NOTE — Nurses Notes (Signed)
 Summary- Patient arrived and received K Phos  per order. PAC right chest accessed using sterile tech. Good blood return. Flushes s difficulty. Lab drawn via PAC. Phosphorus level 3.2. Patient tolerated infusion well. PAC flushed with 20 ml ns post infusion and PAC deaccessed. Sit unremarkable. No s/s of distress noted. Patient denies any complaints or concerns. Ambulated out of facility.Lura Budge, LPN

## 2024-04-16 ENCOUNTER — Encounter (HOSPITAL_COMMUNITY): Payer: Self-pay | Admitting: NURSE PRACTITIONER

## 2024-04-16 ENCOUNTER — Other Ambulatory Visit (INDEPENDENT_AMBULATORY_CARE_PROVIDER_SITE_OTHER): Payer: Self-pay | Admitting: NURSE PRACTITIONER

## 2024-04-17 ENCOUNTER — Encounter (HOSPITAL_COMMUNITY): Payer: Self-pay | Admitting: NURSE PRACTITIONER

## 2024-04-22 ENCOUNTER — Encounter (HOSPITAL_COMMUNITY): Payer: Self-pay

## 2024-04-22 ENCOUNTER — Other Ambulatory Visit: Payer: Self-pay

## 2024-04-22 ENCOUNTER — Other Ambulatory Visit (INDEPENDENT_AMBULATORY_CARE_PROVIDER_SITE_OTHER): Payer: Self-pay | Admitting: NURSE PRACTITIONER

## 2024-04-22 ENCOUNTER — Ambulatory Visit
Admission: RE | Admit: 2024-04-22 | Discharge: 2024-04-22 | Disposition: A | Payer: Self-pay | Source: Ambulatory Visit | Attending: NURSE PRACTITIONER

## 2024-04-22 VITALS — BP 126/83 | HR 92 | Temp 97.9°F | Resp 20

## 2024-04-22 DIAGNOSIS — E119 Type 2 diabetes mellitus without complications: Secondary | ICD-10-CM

## 2024-04-22 DIAGNOSIS — D649 Anemia, unspecified: Secondary | ICD-10-CM

## 2024-04-22 DIAGNOSIS — R600 Localized edema: Secondary | ICD-10-CM

## 2024-04-22 DIAGNOSIS — R7989 Other specified abnormal findings of blood chemistry: Secondary | ICD-10-CM

## 2024-04-22 DIAGNOSIS — E039 Hypothyroidism, unspecified: Secondary | ICD-10-CM

## 2024-04-22 DIAGNOSIS — D508 Other iron deficiency anemias: Secondary | ICD-10-CM

## 2024-04-22 LAB — COMPREHENSIVE METABOLIC PANEL, NON-FASTING
ALBUMIN/GLOBULIN RATIO: 1.3 (ref 0.8–1.4)
ALBUMIN: 3.6 g/dL (ref 3.5–5.7)
ALKALINE PHOSPHATASE: 109 U/L — ABNORMAL HIGH (ref 34–104)
ALT (SGPT): 190 U/L — ABNORMAL HIGH (ref 7–52)
ANION GAP: 4 mmol/L (ref 4–13)
AST (SGOT): 134 U/L — ABNORMAL HIGH (ref 13–39)
BILIRUBIN TOTAL: 0.4 mg/dL (ref 0.3–1.0)
BUN/CREA RATIO: 11 (ref 6–22)
BUN: 10 mg/dL (ref 7–25)
CALCIUM, CORRECTED: 9.2 mg/dL (ref 8.9–10.8)
CALCIUM: 8.9 mg/dL (ref 8.6–10.3)
CHLORIDE: 110 mmol/L — ABNORMAL HIGH (ref 98–107)
CO2 TOTAL: 27 mmol/L (ref 21–31)
CREATININE: 0.88 mg/dL (ref 0.60–1.30)
ESTIMATED GFR: 75 mL/min/{1.73_m2} (ref >59)
GLOBULIN: 2.8 (ref 2.0–3.5)
GLUCOSE: 106 mg/dL (ref 74–109)
OSMOLALITY, CALCULATED: 281 mosm/kg (ref 270–290)
POTASSIUM: 3.9 mmol/L (ref 3.5–5.1)
PROTEIN TOTAL: 6.4 g/dL (ref 6.4–8.9)
SODIUM: 141 mmol/L (ref 136–145)

## 2024-04-22 LAB — PT/INR
INR: 1.04 (ref 0.84–1.10)
PROTHROMBIN TIME: 11.8 s (ref 9.8–12.7)

## 2024-04-22 LAB — THYROID STIMULATING HORMONE WITH FREE T4 REFLEX: TSH: 2.494 u[IU]/mL (ref 0.450–5.330)

## 2024-04-22 LAB — PHOSPHORUS: PHOSPHORUS: 3.6 mg/dL — ABNORMAL LOW (ref 3.7–7.2)

## 2024-04-22 LAB — HGA1C (HEMOGLOBIN A1C WITH EST AVG GLUCOSE): HEMOGLOBIN A1C: 5.7 % (ref 4.0–6.0)

## 2024-04-22 MED ORDER — PROCHLORPERAZINE EDISYLATE 10 MG/2 ML (5 MG/ML) INJECTION SOLUTION
10.0000 mg | Freq: Once | INTRAMUSCULAR | Status: AC
  Administered 2024-04-22: 10 mg via INTRAVENOUS
  Filled 2024-04-22: qty 2

## 2024-04-22 MED ORDER — SODIUM CHLORIDE 0.9 % INTRAVENOUS SOLUTION
20.0000 mmol | Freq: Once | INTRAVENOUS | Status: AC
  Administered 2024-04-22: 20 mmol via INTRAVENOUS
  Administered 2024-04-22: 0 mmol via INTRAVENOUS
  Filled 2024-04-22: qty 6.67

## 2024-04-22 NOTE — Nurses Notes (Signed)
 Summary: pt arrived an received  potassium phos with .no complaints or distress noted during or after treatment.port accessed with no problem.assessment WNL.infusion complete,port deaccessed with pressure dressing applied.pt down via walking at DC no concerns voiced upon leaving floor.Marshall Shropshire, LPN

## 2024-04-23 ENCOUNTER — Other Ambulatory Visit (INDEPENDENT_AMBULATORY_CARE_PROVIDER_SITE_OTHER): Payer: Self-pay | Admitting: NURSE PRACTITIONER

## 2024-04-23 LAB — HEPATITIS B SURFACE ANTIGEN: HBV SURFACE ANTIGEN QUALITATIVE: NEGATIVE

## 2024-04-23 LAB — HEPATITIS A (HAV) IGM ANTIBODY: HAV IGM: NEGATIVE

## 2024-04-23 LAB — HEPATITIS B SURFACE ANTIBODY: HBV SURFACE ANTIBODY QUANTITATIVE: 3.19 m[IU]/mL (ref <8.00)

## 2024-04-23 LAB — HEPATITIS B CORE ANTIBODY: HBV CORE TOTAL ANTIBODIES: NEGATIVE

## 2024-04-28 ENCOUNTER — Ambulatory Visit

## 2024-04-28 ENCOUNTER — Ambulatory Visit (HOSPITAL_COMMUNITY)

## 2024-04-29 ENCOUNTER — Ambulatory Visit (HOSPITAL_COMMUNITY): Payer: Self-pay

## 2024-04-29 ENCOUNTER — Ambulatory Visit (INDEPENDENT_AMBULATORY_CARE_PROVIDER_SITE_OTHER): Payer: Self-pay | Admitting: NURSE PRACTITIONER

## 2024-04-29 ENCOUNTER — Ambulatory Visit (INDEPENDENT_AMBULATORY_CARE_PROVIDER_SITE_OTHER): Payer: Self-pay

## 2024-05-06 ENCOUNTER — Ambulatory Visit

## 2024-05-07 ENCOUNTER — Ambulatory Visit

## 2024-05-11 ENCOUNTER — Ambulatory Visit (INDEPENDENT_AMBULATORY_CARE_PROVIDER_SITE_OTHER): Payer: Self-pay

## 2024-05-29 ENCOUNTER — Encounter (INDEPENDENT_AMBULATORY_CARE_PROVIDER_SITE_OTHER)
